# Patient Record
Sex: Male | Born: 1955 | Race: Black or African American | Hispanic: No | Marital: Single | Smoking: Former smoker
Health system: Southern US, Community
[De-identification: ages and names within clinical notes are randomized; demographics above are authoritative.]

## PROBLEM LIST (undated history)

## (undated) DIAGNOSIS — M5136 Other intervertebral disc degeneration, lumbar region: Secondary | ICD-10-CM

## (undated) DIAGNOSIS — R51 Headache: Secondary | ICD-10-CM

## (undated) DIAGNOSIS — J309 Allergic rhinitis, unspecified: Secondary | ICD-10-CM

## (undated) DIAGNOSIS — M5030 Other cervical disc degeneration, unspecified cervical region: Secondary | ICD-10-CM

## (undated) DIAGNOSIS — T7840XA Allergy, unspecified, initial encounter: Secondary | ICD-10-CM

## (undated) DIAGNOSIS — R519 Headache, unspecified: Secondary | ICD-10-CM

## (undated) DIAGNOSIS — F410 Panic disorder [episodic paroxysmal anxiety] without agoraphobia: Secondary | ICD-10-CM

## (undated) DIAGNOSIS — K219 Gastro-esophageal reflux disease without esophagitis: Secondary | ICD-10-CM

## (undated) DIAGNOSIS — F329 Major depressive disorder, single episode, unspecified: Secondary | ICD-10-CM

## (undated) DIAGNOSIS — I10 Essential (primary) hypertension: Secondary | ICD-10-CM

## (undated) DIAGNOSIS — F32A Depression, unspecified: Secondary | ICD-10-CM

## (undated) DIAGNOSIS — N200 Calculus of kidney: Secondary | ICD-10-CM

## (undated) DIAGNOSIS — Z87442 Personal history of urinary calculi: Secondary | ICD-10-CM

## (undated) HISTORY — DX: Calculus of kidney: N20.0

## (undated) HISTORY — PX: NO PAST SURGERIES: SHX2092

## (undated) HISTORY — PX: SKIN GRAFT: SHX250

## (undated) HISTORY — DX: Allergy, unspecified, initial encounter: T78.40XA

## (undated) HISTORY — DX: Other cervical disc degeneration, unspecified cervical region: M50.30

## (undated) HISTORY — DX: Essential (primary) hypertension: I10

## (undated) HISTORY — DX: Headache, unspecified: R51.9

## (undated) HISTORY — DX: Other intervertebral disc degeneration, lumbar region: M51.36

## (undated) HISTORY — DX: Gastro-esophageal reflux disease without esophagitis: K21.9

## (undated) HISTORY — DX: Allergic rhinitis, unspecified: J30.9

## (undated) HISTORY — DX: Depression, unspecified: F32.A

## (undated) HISTORY — DX: Major depressive disorder, single episode, unspecified: F32.9

## (undated) HISTORY — PX: MOUTH SURGERY: SHX715

## (undated) HISTORY — DX: Headache: R51

---

## 2001-09-13 ENCOUNTER — Encounter: Payer: Self-pay | Admitting: Emergency Medicine

## 2001-09-13 ENCOUNTER — Emergency Department (HOSPITAL_COMMUNITY): Admission: EM | Admit: 2001-09-13 | Discharge: 2001-09-13 | Payer: Self-pay | Admitting: Emergency Medicine

## 2001-09-15 ENCOUNTER — Emergency Department (HOSPITAL_COMMUNITY): Admission: EM | Admit: 2001-09-15 | Discharge: 2001-09-15 | Payer: Self-pay | Admitting: Emergency Medicine

## 2003-01-20 ENCOUNTER — Emergency Department (HOSPITAL_COMMUNITY): Admission: EM | Admit: 2003-01-20 | Discharge: 2003-01-20 | Payer: Self-pay | Admitting: Emergency Medicine

## 2003-01-20 ENCOUNTER — Encounter: Payer: Self-pay | Admitting: Emergency Medicine

## 2003-04-12 ENCOUNTER — Emergency Department (HOSPITAL_COMMUNITY): Admission: EM | Admit: 2003-04-12 | Discharge: 2003-04-13 | Payer: Self-pay | Admitting: Emergency Medicine

## 2009-08-15 ENCOUNTER — Emergency Department (HOSPITAL_COMMUNITY): Admission: EM | Admit: 2009-08-15 | Discharge: 2009-08-15 | Payer: Self-pay | Admitting: Emergency Medicine

## 2010-04-21 ENCOUNTER — Emergency Department (HOSPITAL_COMMUNITY)
Admission: EM | Admit: 2010-04-21 | Discharge: 2010-04-21 | Payer: Self-pay | Source: Home / Self Care | Admitting: Emergency Medicine

## 2010-04-22 ENCOUNTER — Emergency Department (HOSPITAL_COMMUNITY)
Admission: EM | Admit: 2010-04-22 | Discharge: 2010-04-22 | Payer: Self-pay | Source: Home / Self Care | Admitting: Emergency Medicine

## 2010-06-16 ENCOUNTER — Encounter (HOSPITAL_COMMUNITY): Payer: Self-pay | Admitting: Radiology

## 2010-06-16 ENCOUNTER — Emergency Department (HOSPITAL_COMMUNITY)
Admission: EM | Admit: 2010-06-16 | Discharge: 2010-06-16 | Disposition: A | Discharge status: Home / Self Care | Payer: Self-pay | Attending: Emergency Medicine | Admitting: Emergency Medicine

## 2010-06-16 ENCOUNTER — Emergency Department (HOSPITAL_COMMUNITY): Payer: Self-pay

## 2010-06-16 DIAGNOSIS — R10819 Abdominal tenderness, unspecified site: Secondary | ICD-10-CM | POA: Insufficient documentation

## 2010-06-16 DIAGNOSIS — M62838 Other muscle spasm: Secondary | ICD-10-CM | POA: Insufficient documentation

## 2010-06-16 DIAGNOSIS — M5137 Other intervertebral disc degeneration, lumbosacral region: Secondary | ICD-10-CM | POA: Insufficient documentation

## 2010-06-16 DIAGNOSIS — R109 Unspecified abdominal pain: Secondary | ICD-10-CM | POA: Insufficient documentation

## 2010-06-16 DIAGNOSIS — M51379 Other intervertebral disc degeneration, lumbosacral region without mention of lumbar back pain or lower extremity pain: Secondary | ICD-10-CM | POA: Insufficient documentation

## 2010-06-16 DIAGNOSIS — E669 Obesity, unspecified: Secondary | ICD-10-CM | POA: Insufficient documentation

## 2010-06-16 DIAGNOSIS — R609 Edema, unspecified: Secondary | ICD-10-CM | POA: Insufficient documentation

## 2010-06-16 DIAGNOSIS — N200 Calculus of kidney: Secondary | ICD-10-CM | POA: Insufficient documentation

## 2010-06-16 LAB — COMPREHENSIVE METABOLIC PANEL
ALT: 15 U/L (ref 0–53)
AST: 26 U/L (ref 0–37)
Albumin: 3.7 g/dL (ref 3.5–5.2)
Alkaline Phosphatase: 56 U/L (ref 39–117)
BUN: 11 mg/dL (ref 6–23)
CO2: 25 mEq/L (ref 19–32)
Calcium: 9.1 mg/dL (ref 8.4–10.5)
Chloride: 103 mEq/L (ref 96–112)
Creatinine, Ser: 1.08 mg/dL (ref 0.4–1.5)
GFR calc Af Amer: >60 mL/min (ref >60)
GFR calc non Af Amer: >60 mL/min (ref >60)
Glucose, Bld: 95 mg/dL (ref 70–99)
Potassium: 3.3 mEq/L — ABNORMAL LOW (ref 3.5–5.1)
Sodium: 137 mEq/L (ref 135–145)
Total Bilirubin: 0.7 mg/dL (ref 0.3–1.2)
Total Protein: 6.6 g/dL (ref 6.0–8.3)

## 2010-06-16 LAB — URINALYSIS, ROUTINE W REFLEX MICROSCOPIC
Bilirubin Urine: NEGATIVE
Ketones, ur: NEGATIVE mg/dL
Leukocytes, UA: NEGATIVE
Nitrite: NEGATIVE
Protein, ur: NEGATIVE mg/dL
Specific Gravity, Urine: 1.013 (ref 1.005–1.030)
Urine Glucose, Fasting: NEGATIVE mg/dL
Urobilinogen, UA: 0.2 mg/dL (ref 0.0–1.0)
pH: 6.5 (ref 5.0–8.0)

## 2010-06-16 LAB — URINE MICROSCOPIC-ADD ON

## 2010-09-15 ENCOUNTER — Emergency Department (HOSPITAL_COMMUNITY)
Admission: EM | Admit: 2010-09-15 | Discharge: 2010-09-15 | Disposition: A | Discharge status: Home / Self Care | Payer: Self-pay | Attending: Emergency Medicine | Admitting: Emergency Medicine

## 2010-09-15 DIAGNOSIS — M545 Low back pain, unspecified: Secondary | ICD-10-CM | POA: Insufficient documentation

## 2010-09-15 DIAGNOSIS — R109 Unspecified abdominal pain: Secondary | ICD-10-CM | POA: Insufficient documentation

## 2010-09-15 LAB — POCT I-STAT, CHEM 8
BUN: 12 mg/dL (ref 6–23)
Calcium, Ion: 1.16 mmol/L (ref 1.12–1.32)
Chloride: 103 mEq/L (ref 96–112)
Creatinine, Ser: 1.2 mg/dL (ref 0.4–1.5)
Glucose, Bld: 85 mg/dL (ref 70–99)
HCT: 47 % (ref 39.0–52.0)
Hemoglobin: 16 g/dL (ref 13.0–17.0)
Potassium: 3.8 mEq/L (ref 3.5–5.1)
Sodium: 139 mEq/L (ref 135–145)
TCO2: 25 mmol/L (ref 0–100)

## 2010-09-15 LAB — URINALYSIS, ROUTINE W REFLEX MICROSCOPIC
Bilirubin Urine: NEGATIVE
Glucose, UA: NEGATIVE mg/dL
Ketones, ur: NEGATIVE mg/dL
Leukocytes, UA: NEGATIVE
Nitrite: NEGATIVE
Protein, ur: NEGATIVE mg/dL
Specific Gravity, Urine: 1.016 (ref 1.005–1.030)
Urobilinogen, UA: 0.2 mg/dL (ref 0.0–1.0)
pH: 6.5 (ref 5.0–8.0)

## 2010-09-15 LAB — URINE MICROSCOPIC-ADD ON

## 2010-10-26 ENCOUNTER — Other Ambulatory Visit (INDEPENDENT_AMBULATORY_CARE_PROVIDER_SITE_OTHER): Payer: Self-pay

## 2010-10-26 ENCOUNTER — Encounter: Payer: Self-pay | Admitting: Internal Medicine

## 2010-10-26 ENCOUNTER — Ambulatory Visit (INDEPENDENT_AMBULATORY_CARE_PROVIDER_SITE_OTHER): Payer: Self-pay | Admitting: Internal Medicine

## 2010-10-26 VITALS — BP 174/110 | HR 81 | Temp 97.4°F | Ht 69.0 in | Wt 173.0 lb

## 2010-10-26 DIAGNOSIS — R079 Chest pain, unspecified: Secondary | ICD-10-CM | POA: Insufficient documentation

## 2010-10-26 DIAGNOSIS — J309 Allergic rhinitis, unspecified: Secondary | ICD-10-CM

## 2010-10-26 DIAGNOSIS — N200 Calculus of kidney: Secondary | ICD-10-CM | POA: Insufficient documentation

## 2010-10-26 DIAGNOSIS — M25512 Pain in left shoulder: Secondary | ICD-10-CM

## 2010-10-26 DIAGNOSIS — I10 Essential (primary) hypertension: Secondary | ICD-10-CM

## 2010-10-26 DIAGNOSIS — M5030 Other cervical disc degeneration, unspecified cervical region: Secondary | ICD-10-CM | POA: Insufficient documentation

## 2010-10-26 DIAGNOSIS — Z0000 Encounter for general adult medical examination without abnormal findings: Secondary | ICD-10-CM

## 2010-10-26 DIAGNOSIS — M25519 Pain in unspecified shoulder: Secondary | ICD-10-CM

## 2010-10-26 DIAGNOSIS — M5136 Other intervertebral disc degeneration, lumbar region: Secondary | ICD-10-CM

## 2010-10-26 DIAGNOSIS — M51369 Other intervertebral disc degeneration, lumbar region without mention of lumbar back pain or lower extremity pain: Secondary | ICD-10-CM

## 2010-10-26 DIAGNOSIS — K219 Gastro-esophageal reflux disease without esophagitis: Secondary | ICD-10-CM | POA: Insufficient documentation

## 2010-10-26 HISTORY — DX: Other intervertebral disc degeneration, lumbar region without mention of lumbar back pain or lower extremity pain: M51.369

## 2010-10-26 HISTORY — DX: Allergic rhinitis, unspecified: J30.9

## 2010-10-26 HISTORY — DX: Other cervical disc degeneration, unspecified cervical region: M50.30

## 2010-10-26 HISTORY — DX: Other intervertebral disc degeneration, lumbar region: M51.36

## 2010-10-26 HISTORY — DX: Essential (primary) hypertension: I10

## 2010-10-26 LAB — CREATININE, SERUM: Creatinine, Ser: 1.1 mg/dL (ref 0.4–1.5)

## 2010-10-26 LAB — BUN: BUN: 19 mg/dL (ref 6–23)

## 2010-10-26 MED ORDER — AMLODIPINE BESYLATE 10 MG PO TABS: 10.0000 mg | ORAL_TABLET | Freq: Every day | ORAL | Status: DC

## 2010-10-26 NOTE — Patient Instructions (Addendum)
Your EKG and exam was good today Take all new medications as prescribed - the amlodipine 10 mg per day (I believe less expensive cash pay at [employer]) Please check your BP at work (in 3 wks) at least 3-4 times over 3-4 days and call with results to [phone] Please go to LAB in the Basement for the blood and/or urine tests to be done today Please call the phone number [phone] (the PhoneTree System) for results of testing in 2-3 days;  When calling, simply dial the number, and when prompted enter the MRN number above (the Medical Record Number) and the # key, then the message should start. Please return in 3 mo with Lab testing done 3-5 days before (you may wish to cancel if you do not have the insurance by that time) OK to take tylenol as needed for pain

## 2010-10-26 NOTE — Assessment & Plan Note (Signed)
Exam benign,  Ok for tylenol prn ,  to f/u any worsening symptoms or concerns

## 2010-10-26 NOTE — Assessment & Plan Note (Signed)
New onset, severe but o/w asymtp; no insurance today; for education and counseling regarding HTN and end organ damage risk;  For amlodipine 10 mg daily ($5 at [employer]), check BP in 3 wks at work and call with results, consider add hctz at that time;  For BUN/cr today, avoid nsaids for now and ace/arb due to unknown renal fxn

## 2010-10-26 NOTE — Progress Notes (Signed)
Quick Note:  Voice message left on PhoneTree system - lab is negative, normal or otherwise stable, pt to continue same tx ______ 

## 2010-10-26 NOTE — Progress Notes (Signed)
  Subjective:    Patient ID: Mason Morales, male    DOB: [birth year], 55 y.o.   MRN: [id number]  HPI  Here to establish;  Has had BP checked at work wit persistent numbers similar to today several times in the past month;  Is very active, does not tend to gain wt, so had not thought to check.  Also mentions intermittent mid and left CP, dull, mild, "like a tightness" , no radiation, nonexertional and is pleuritic and tender to palpate at the left costal margin, not assoc with sob, n/v, palp's or dizziness.   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    Pt continues to have recurring left LBP without change in severity, bowel or bladder change, fever, wt loss,  worsening LE pain/numbness/weakness, gait change or falls, mild, makes him worry about recurrent kidney stones.  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  HENT: Negative for drooling and tinnitus.   Eyes: Negative for photophobia and visual disturbance.  Respiratory: Negative for choking and stridor.   Gastrointestinal: Negative for vomiting and blood in stool.  Genitourinary: Negative for hematuria and decreased urine volume. .       Objective:   Physical Exam BP 174/110  Pulse 81  Temp(Src) 97.4 F (36.3 C) (Oral)  Ht 5\' 9"  (1.753 m)  Wt 173 lb (78.472  kg)  BMI 25.55 kg/m2  SpO2 97% Physical Exam  VS noted, not ill appearing,  Constitutional: Pt appears well-developed and well-nourished., athletic appearing HENT: Head: Normocephalic.  Right Ear: External ear normal.  Left Ear: External ear normal.  Eyes: Conjunctivae and EOM are normal. Pupils are equal, round, and reactive to light.  Neck: Normal range of motion. Neck supple.  Cardiovascular: Normal rate and regular rhythm.   Pulmonary/Chest: Effort normal and breath sounds normal.  Abd:  Soft, NT, non-distended, + BS Neurological: Pt is alert. No cranial nerve deficit.  Skin: Skin is warm. No erythema. No LE edema Psychiatric: Pt behavior is normal. Thought content normal.  Bilat bony AC joint hypertrophy noted Left shoulder NT and FROM, no swelling or rash       Assessment & Plan:

## 2010-10-26 NOTE — Assessment & Plan Note (Signed)
Atypical, suspect MSK, for ECG today, ok to o/w follow

## 2011-01-24 ENCOUNTER — Ambulatory Visit: Payer: Self-pay | Admitting: Internal Medicine

## 2011-04-05 ENCOUNTER — Encounter (HOSPITAL_COMMUNITY): Payer: Self-pay | Admitting: Emergency Medicine

## 2011-04-05 ENCOUNTER — Emergency Department (HOSPITAL_COMMUNITY)
Admission: EM | Admit: 2011-04-05 | Discharge: 2011-04-05 | Disposition: A | Discharge status: Home / Self Care | Payer: Self-pay | Attending: Emergency Medicine | Admitting: Emergency Medicine

## 2011-04-05 DIAGNOSIS — R109 Unspecified abdominal pain: Secondary | ICD-10-CM | POA: Insufficient documentation

## 2011-04-05 DIAGNOSIS — R110 Nausea: Secondary | ICD-10-CM | POA: Insufficient documentation

## 2011-04-05 DIAGNOSIS — Z79899 Other long term (current) drug therapy: Secondary | ICD-10-CM | POA: Insufficient documentation

## 2011-04-05 DIAGNOSIS — K219 Gastro-esophageal reflux disease without esophagitis: Secondary | ICD-10-CM | POA: Insufficient documentation

## 2011-04-05 DIAGNOSIS — F329 Major depressive disorder, single episode, unspecified: Secondary | ICD-10-CM | POA: Insufficient documentation

## 2011-04-05 DIAGNOSIS — F3289 Other specified depressive episodes: Secondary | ICD-10-CM | POA: Insufficient documentation

## 2011-04-05 DIAGNOSIS — R300 Dysuria: Secondary | ICD-10-CM | POA: Insufficient documentation

## 2011-04-05 DIAGNOSIS — M549 Dorsalgia, unspecified: Secondary | ICD-10-CM | POA: Insufficient documentation

## 2011-04-05 DIAGNOSIS — N200 Calculus of kidney: Secondary | ICD-10-CM | POA: Insufficient documentation

## 2011-04-05 LAB — BASIC METABOLIC PANEL
CO2: 22 mEq/L (ref 19–32)
Calcium: 8.9 mg/dL (ref 8.4–10.5)
Creatinine, Ser: 0.91 mg/dL (ref 0.50–1.35)
GFR calc non Af Amer: >90 mL/min (ref >90)
Glucose, Bld: 94 mg/dL (ref 70–99)
Sodium: 135 mEq/L (ref 135–145)

## 2011-04-05 LAB — CBC
MCH: 31.4 pg (ref 26.0–34.0)
MCV: 90.5 fL (ref 78.0–100.0)
Platelets: 167 10*3/uL (ref 150–400)
RBC: 4.65 MIL/uL (ref 4.22–5.81)
RDW: 12.2 % (ref 11.5–15.5)

## 2011-04-05 LAB — URINALYSIS, ROUTINE W REFLEX MICROSCOPIC
Ketones, ur: NEGATIVE mg/dL
Leukocytes, UA: NEGATIVE
Nitrite: NEGATIVE
Protein, ur: NEGATIVE mg/dL
Urobilinogen, UA: 0.2 mg/dL (ref 0.0–1.0)

## 2011-04-05 LAB — DIFFERENTIAL
Basophils Absolute: 0 10*3/uL (ref 0.0–0.1)
Basophils Relative: 0 % (ref 0–1)
Eosinophils Absolute: 0.1 10*3/uL (ref 0.0–0.7)
Eosinophils Relative: 2 % (ref 0–5)
Lymphs Abs: 2.2 10*3/uL (ref 0.7–4.0)
Neutrophils Relative %: 51 % (ref 43–77)

## 2011-04-05 LAB — URINE MICROSCOPIC-ADD ON

## 2011-04-05 MED ORDER — ONDANSETRON HCL 4 MG/2ML IJ SOLN
4.0000 mg | Freq: Once | INTRAMUSCULAR | Status: AC
  Administered 2011-04-05: 4 mg via INTRAVENOUS
  Filled 2011-04-05: qty 2

## 2011-04-05 MED ORDER — KETOROLAC TROMETHAMINE 30 MG/ML IJ SOLN
30.0000 mg | Freq: Once | INTRAMUSCULAR | Status: AC
  Administered 2011-04-05: 30 mg via INTRAVENOUS
  Filled 2011-04-05: qty 1

## 2011-04-05 MED ORDER — TAMSULOSIN HCL 0.4 MG PO CAPS: 0.4000 mg | ORAL_CAPSULE | ORAL | Status: DC

## 2011-04-05 MED ORDER — HYDROMORPHONE HCL PF 1 MG/ML IJ SOLN
1.0000 mg | Freq: Once | INTRAMUSCULAR | Status: AC
  Administered 2011-04-05: 1 mg via INTRAVENOUS
  Filled 2011-04-05: qty 1

## 2011-04-05 MED ORDER — SODIUM CHLORIDE 0.9 % IV BOLUS (SEPSIS)
1000.0000 mL | Freq: Once | INTRAVENOUS | Status: AC
  Administered 2011-04-05: 1000 mL via INTRAVENOUS

## 2011-04-05 MED ORDER — OXYCODONE-ACETAMINOPHEN 5-325 MG PO TABS: 1.0000 | ORAL_TABLET | Freq: Four times a day (QID) | ORAL | Status: AC | PRN

## 2011-04-05 NOTE — ED Provider Notes (Signed)
History     CSN: [id number] Arrival date & time: 04/05/2011 10:26 AM   First MD Initiated Contact with Patient 04/05/11 1039      Chief Complaint  Patient presents with  . Back Pain  . Flank Pain    (Consider location/radiation/quality/duration/timing/severity/associated sxs/prior treatment) HPI Comments: Patient has a history of kidney stones and states that on Tuesday started developing flank pain worse on the left but now just has generalized back pain and groin pain. Denies any penile discharge. Pain is now a 10 out of 10  Patient is a 55 y.o. male presenting with flank pain. The history is provided by the patient.  Flank Pain This is a new problem. Episode onset: 3 days ago. The problem occurs constantly. The problem has been gradually worsening. Associated symptoms include abdominal pain. Pertinent negatives include no chest pain and no shortness of breath. Associated symptoms comments: No fever or vomiting or diarrhea. Persistent nausea with dysuria.. Exacerbated by: Urination. The symptoms are relieved by nothing. He has tried acetaminophen and rest for the symptoms. The treatment provided no relief.    Past Medical History  Diagnosis Date  . Depression   . Generalized headaches   . GERD (gastroesophageal reflux disease)   . Allergy   . Hypertension   . Kidney stones   . Allergic rhinitis, cause unspecified 10/26/2010  . HTN (hypertension) 10/26/2010  . DDD (degenerative disc disease), cervical 10/26/2010  . DDD (degenerative disc disease), lumbar 10/26/2010    Past Surgical History  Procedure Date  . Mouth surgery at 55 yo after horse accident    Family History  Problem Relation Age of Onset  . Hyperlipidemia Other   . Heart disease Other   . Stroke Other   . Hypertension Other   . Diabetes Other   . Cancer Other     prostate cancer  . Diabetes Other   . Alcohol abuse Other   . Cancer Other     breast cancer  . Stroke Other     History  Substance Use Topics   . Smoking status: Current Everyday Smoker  . Smokeless tobacco: Not on file  . Alcohol Use: Yes      Review of Systems  Respiratory: Negative for shortness of breath.   Cardiovascular: Negative for chest pain.  Gastrointestinal: Positive for nausea and abdominal pain. Negative for vomiting and diarrhea.  Genitourinary: Positive for dysuria and flank pain.  All other systems reviewed and are negative.    Allergies  Sulfa antibiotics  Home Medications   Current Outpatient Rx  Name Route Sig Dispense Refill  . AMLODIPINE BESYLATE 10 MG PO TABS Oral Take 1 tablet (10 mg total) by mouth daily. 30 tablet 11  . B COMPLEX VITAMINS PO CAPS Oral Take 1 capsule by mouth daily.      Carma Leaven M PLUS PO TABS Oral Take 1 tablet by mouth daily.      Marland Kitchen FISH OIL 1000 MG PO CAPS Oral Take by mouth 3 (three) times daily.      Marland Kitchen PRILOSEC PO Oral Take 1 capsule by mouth daily as needed. For heartburn,GERD       BP 136/84  Pulse 76  Temp(Src) 97.6 F (36.4 C) (Oral)  Resp 20  SpO2 100%  Physical Exam  Nursing note and vitals reviewed. Constitutional: He is oriented to person, place, and time. He appears well-developed and well-nourished. No distress.  HENT:  Head: Normocephalic and atraumatic.  Mouth/Throat: Oropharynx is clear and moist.  Eyes: Conjunctivae and EOM are normal. Pupils are equal, round, and reactive to light.  Neck: Normal range of motion. Neck supple.  Cardiovascular: Normal rate, regular rhythm and intact distal pulses.   No murmur heard. Pulmonary/Chest: Effort normal and breath sounds normal. No respiratory distress. He has no wheezes. He has no rales.  Abdominal: Soft. He exhibits no distension. There is tenderness in the suprapubic area. There is CVA tenderness. There is no rebound and no guarding.       CVA tenderness most pronounced on the left  Musculoskeletal: Normal range of motion. He exhibits no edema and no tenderness.  Neurological: He is alert and oriented  to person, place, and time.  Skin: Skin is warm and dry. No rash noted. No erythema.  Psychiatric: He has a normal mood and affect. His behavior is normal.    ED Course  Procedures (including critical care time)  Results for orders placed during the hospital encounter of 04/05/11  BASIC METABOLIC PANEL      Component Value Range   Sodium 135  135 - 145 (mEq/L)   Potassium 3.5  3.5 - 5.1 (mEq/L)   Chloride 102  96 - 112 (mEq/L)   CO2 22  19 - 32 (mEq/L)   Glucose, Bld 94  70 - 99 (mg/dL)   BUN 13  6 - 23 (mg/dL)   Creatinine, Ser 1.61  0.50 - 1.35 (mg/dL)   Calcium 8.9  8.4 - 09.6 (mg/dL)   GFR calc non Af Amer >90  >90 (mL/min)   GFR calc Af Amer >90  >90 (mL/min)  CBC      Component Value Range   WBC 5.5  4.0 - 10.5 (K/uL)   RBC 4.65  4.22 - 5.81 (MIL/uL)   Hemoglobin 14.6  13.0 - 17.0 (g/dL)   HCT 04.5  40.9 - 81.1 (%)   MCV 90.5  78.0 - 100.0 (fL)   MCH 31.4  26.0 - 34.0 (pg)   MCHC 34.7  30.0 - 36.0 (g/dL)   RDW 91.4  78.2 - 95.6 (%)   Platelets 167  150 - 400 (K/uL)  DIFFERENTIAL      Component Value Range   Neutrophils Relative 51  43 - 77 (%)   Neutro Abs 2.8  1.7 - 7.7 (K/uL)   Lymphocytes Relative 41  12 - 46 (%)   Lymphs Abs 2.2  0.7 - 4.0 (K/uL)   Monocytes Relative 6  3 - 12 (%)   Monocytes Absolute 0.3  0.1 - 1.0 (K/uL)   Eosinophils Relative 2  0 - 5 (%)   Eosinophils Absolute 0.1  0.0 - 0.7 (K/uL)   Basophils Relative 0  0 - 1 (%)   Basophils Absolute 0.0  0.0 - 0.1 (K/uL)  URINALYSIS, ROUTINE W REFLEX MICROSCOPIC      Component Value Range   Color, Urine YELLOW  YELLOW    APPearance TURBID (*) CLEAR    Specific Gravity, Urine 1.013  1.005 - 1.030    pH 6.5  5.0 - 8.0    Glucose, UA NEGATIVE  NEGATIVE (mg/dL)   Hgb urine dipstick TRACE (*) NEGATIVE    Bilirubin Urine NEGATIVE  NEGATIVE    Ketones, ur NEGATIVE  NEGATIVE (mg/dL)   Protein, ur NEGATIVE  NEGATIVE (mg/dL)   Urobilinogen, UA 0.2  0.0 - 1.0 (mg/dL)   Nitrite NEGATIVE  NEGATIVE     Leukocytes, UA NEGATIVE  NEGATIVE   URINE MICROSCOPIC-ADD ON      Component  Value Range   WBC, UA 0-2  <3 (WBC/hpf)   RBC / HPF 0-2  <3 (RBC/hpf)   Urine-Other MUCOUS PRESENT     No results found.    No diagnosis found.    MDM   Pt with symptoms consistent with kidney stone vs pyelonephritis.  Denies GI symptoms.  Low concern for diverticulitis and no risk factors or history suggestive of AAA.  No hx suggestive of GU source (discharge) and otherwise pt is healthy.  History of kidney stones in the past and a CT done in February showed a small right 2 mm stone in the upper pole of the kidney. Patient states symptoms are worse on the left today with nausea but no vomiting and no fever. Will hydrate, treat pain and ensure no infection with UA, CBC, BMP to evaluate. Will hold off finding repeat CT in less pain does not improve.  12:36 PM Is within normal limits other than trace hemoglobin in the urine. Patient is most likely passing a kidney stone however by mouth that since his last CT only showed a 2 mm stone there is no reason to rescan him today. Will treat pain symptomatically and have him followup with urology.    Gwyneth Sprout, MD 04/05/11 [phone]

## 2011-04-05 NOTE — ED Notes (Signed)
Pt up ambulatory at this time to the bathroom to attempt to provide an urine specimen

## 2011-04-05 NOTE — ED Notes (Signed)
Pt here with c/o bilateral low back, flank and groin pain that started on Tuesday and has become worse.  Pt rates pain 10/10 and reports nausea with the pain.  Pt alert and oriented x4 and in NAD.  Pt reports hx of kidney stones.

## 2011-04-05 NOTE — ED Notes (Signed)
Pt here with c/o low back, flank and groin pain bilaterally.  Pt reports nausea as well and is rating pain 10/10.  Pt denies hematuria.

## 2011-11-05 ENCOUNTER — Other Ambulatory Visit: Payer: Self-pay

## 2011-11-05 DIAGNOSIS — I10 Essential (primary) hypertension: Secondary | ICD-10-CM

## 2011-11-05 MED ORDER — AMLODIPINE BESYLATE 10 MG PO TABS: 10.0000 mg | ORAL_TABLET | Freq: Every day | ORAL | Status: DC

## 2011-12-04 ENCOUNTER — Other Ambulatory Visit: Payer: Self-pay

## 2011-12-04 DIAGNOSIS — I10 Essential (primary) hypertension: Secondary | ICD-10-CM

## 2011-12-04 MED ORDER — AMLODIPINE BESYLATE 10 MG PO TABS: 10.0000 mg | ORAL_TABLET | Freq: Every day | ORAL | Status: DC

## 2011-12-21 ENCOUNTER — Other Ambulatory Visit: Payer: Self-pay

## 2011-12-21 DIAGNOSIS — I10 Essential (primary) hypertension: Secondary | ICD-10-CM

## 2011-12-21 MED ORDER — AMLODIPINE BESYLATE 10 MG PO TABS: 10.0000 mg | ORAL_TABLET | Freq: Every day | ORAL | Status: DC

## 2012-01-21 ENCOUNTER — Other Ambulatory Visit: Payer: Self-pay | Admitting: Internal Medicine

## 2012-02-22 ENCOUNTER — Other Ambulatory Visit: Payer: Self-pay

## 2012-02-22 DIAGNOSIS — I10 Essential (primary) hypertension: Secondary | ICD-10-CM

## 2012-02-22 MED ORDER — AMLODIPINE BESYLATE 10 MG PO TABS: 10.0000 mg | ORAL_TABLET | Freq: Every day | ORAL | Status: DC

## 2012-02-22 NOTE — Telephone Encounter (Signed)
Called the patient left detailed message to call the office asap to schedule appt. With Dr. Jonny Ruiz to get further refills.

## 2012-03-18 ENCOUNTER — Ambulatory Visit (INDEPENDENT_AMBULATORY_CARE_PROVIDER_SITE_OTHER): Payer: Self-pay | Admitting: Internal Medicine

## 2012-03-18 ENCOUNTER — Other Ambulatory Visit (INDEPENDENT_AMBULATORY_CARE_PROVIDER_SITE_OTHER): Payer: Self-pay

## 2012-03-18 ENCOUNTER — Encounter: Payer: Self-pay | Admitting: Internal Medicine

## 2012-03-18 VITALS — BP 134/82 | HR 72 | Temp 98.0°F | Ht 69.0 in | Wt 167.4 lb

## 2012-03-18 DIAGNOSIS — K219 Gastro-esophageal reflux disease without esophagitis: Secondary | ICD-10-CM

## 2012-03-18 DIAGNOSIS — I10 Essential (primary) hypertension: Secondary | ICD-10-CM

## 2012-03-18 DIAGNOSIS — Z0000 Encounter for general adult medical examination without abnormal findings: Secondary | ICD-10-CM

## 2012-03-18 DIAGNOSIS — M549 Dorsalgia, unspecified: Secondary | ICD-10-CM | POA: Insufficient documentation

## 2012-03-18 LAB — URINALYSIS, ROUTINE W REFLEX MICROSCOPIC
Bilirubin Urine: NEGATIVE
Ketones, ur: NEGATIVE
Nitrite: NEGATIVE
Total Protein, Urine: NEGATIVE
Urine Glucose: NEGATIVE
pH: 6 (ref 5.0–8.0)

## 2012-03-18 MED ORDER — AMLODIPINE BESYLATE 10 MG PO TABS: 10.0000 mg | ORAL_TABLET | Freq: Every day | ORAL | Status: DC

## 2012-03-18 MED ORDER — ASPIRIN 81 MG PO TBEC: 81.0000 mg | DELAYED_RELEASE_TABLET | Freq: Every day | ORAL | Status: DC

## 2012-03-18 MED ORDER — OMEPRAZOLE 20 MG PO CPDR: 20.0000 mg | DELAYED_RELEASE_CAPSULE | Freq: Every day | ORAL | Status: DC

## 2012-03-18 MED ORDER — TRAMADOL HCL 50 MG PO TABS: 50.0000 mg | ORAL_TABLET | Freq: Four times a day (QID) | ORAL | Status: DC | PRN

## 2012-03-18 NOTE — Assessment & Plan Note (Signed)
stable overall by hx and exam, and pt to continue medical treatment as before, for med refill 

## 2012-03-18 NOTE — Progress Notes (Signed)
Subjective:    Patient ID: Mason Morales, male    DOB: January 22, 1956, 56 y.o.   MRN: [id number]  HPI  Here with c/o 3 wks recurring LBP with radiation to the pubic bone area (right and left about the pelvis level) just not getting better except somewhat better at the moment today, but no bowel or bladder change, fever, wt loss,  worsening LE pain/numbness/weakness, gait change or falls, except for a fall off a horse (has been riding/racing for 40 yrs) prior to onset of episode of pain - was sort of a controlled fall in that he knew he had to get off, and sort of fell/jumped in a way that he could roll and lessen risk of injury; Also with hx of renal stones but not clear if pain was more flank area, and noted no gross hemturia and  Denies urinary symptoms such as dysuria, frequency, urgency.  Also mentions was seen/tx with what sounds like IM rocephin for urethralpenile d/c that improved, but neg for STD including gc/chlamydia.   Pt denies chest pain, increased sob or doe, wheezing, orthopnea, PND, increased LE swelling, palpitations, dizziness or syncope.  Pt denies new neurological symptoms such as new headache, or facial or extremity weakness or numbness   Pt denies polydipsia, polyuria.  Denies worsening reflux, dysphagia, abd pain, n/v, bowel change or blood. Past Medical History  Diagnosis Date  . Depression   . Generalized headaches   . GERD (gastroesophageal reflux disease)   . Allergy   . Hypertension   . Kidney stones   . Allergic rhinitis, cause unspecified 10/26/2010  . HTN (hypertension) 10/26/2010  . DDD (degenerative disc disease), cervical 10/26/2010  . DDD (degenerative disc disease), lumbar 10/26/2010   Past Surgical History  Procedure Date  . Mouth surgery at 56 yo after horse accident    reports that he has been smoking.  He does not have any smokeless tobacco history on file. He reports that he drinks alcohol. He reports that he does not use illicit drugs. family history includes  Alcohol abuse in his other; Cancer in his others; Diabetes in his others; Heart disease in his other; Hyperlipidemia in his other; Hypertension in his other; and Stroke in his others. Allergies  Allergen Reactions  . Sulfa Antibiotics Nausea And Vomiting   Current Outpatient Prescriptions on File Prior to Visit  Medication Sig Dispense Refill  . b complex vitamins capsule Take 1 capsule by mouth daily.        . Multiple Vitamins-Minerals (MULTIVITAMINS THER. W/MINERALS) TABS Take 1 tablet by mouth daily.        . Omega-3 Fatty Acids (FISH OIL) 1000 MG CAPS Take by mouth 3 (three) times daily.        . Omeprazole (PRILOSEC PO) Take 1 capsule by mouth daily as needed. For heartburn,GERD       . [DISCONTINUED] amLODipine (NORVASC) 10 MG tablet TAKE 1 TABLET (10 MG TOTAL) BY MOUTH DAILY.  30 tablet  0  . [DISCONTINUED] amLODipine (NORVASC) 10 MG tablet Take 1 tablet (10 mg total) by mouth daily.  30 tablet  0  . omeprazole (PRILOSEC) 20 MG capsule Take 1 capsule (20 mg total) by mouth daily.  90 capsule  3   Review of Systems  Constitutional: Negative for diaphoresis and unexpected weight change.  HENT: Negative for tinnitus.   Eyes: Negative for photophobia and visual disturbance.  Respiratory: Negative for choking and stridor.   Gastrointestinal: Negative for vomiting and blood in stool.  Genitourinary: Negative for hematuria and decreased urine volume.  Musculoskeletal: Negative for gait problem.  Skin: Negative for color change and wound.  Neurological: Negative for tremors and numbness.  Psychiatric/Behavioral: Negative for decreased concentration. The patient is not hyperactive.      Objective:   Physical Exam BP 134/82  Pulse 72  Temp 98 F (36.7 C) (Oral)  Ht 5\' 9"  (1.753 m)  Wt 167 lb 6 oz (75.921 kg)  BMI 24.72 kg/m2  SpO2 97% Physical Exam  VS noted Constitutional: Pt appears well-developed and well-nourished.  HENT: Head: Normocephalic.  Right Ear: External ear normal.   Left Ear: External ear normal.  Eyes: Conjunctivae and EOM are normal. Pupils are equal, round, and reactive to light.  Neck: Normal range of motion. Neck supple.  Cardiovascular: Normal rate and regular rhythm.   Pulmonary/Chest: Effort normal and breath sounds normal.  Abd:  Soft, NT, non-distended, + BS, no flank tender Neurological: Pt is alert. Not confused , motor/sens/dtr intact, gait intact Skin: Skin is warm. No erythema.  Psychiatric: Pt behavior is normal. Thought content normal.  Spine nontender, has bilat lumbar paravertebral tender    Assessment & Plan:

## 2012-03-18 NOTE — Assessment & Plan Note (Signed)
Unclear etiology, I suspect primary dx MSK related given the fall prior to onset pain with radiation bilat towards the pubic ostium, but cant r/o renal stone or other GU issue such as bladder/renal contusion or other lumbar disc dz with bilat radicular pain vs other;  Ideally I would ask for ls spine films, hip/pelvic films but he declines due to cost/no insurance today;  Will accept check UA however to r/o hematuria

## 2012-03-18 NOTE — Patient Instructions (Addendum)
Take all new medications as prescribed  - the pain medication Continue all other medications as before (all refilled today) Please also start Aspirin 81 mg -1 per day - COATED only Please go to LAB in the Basement for the urine test to be done today - ONLY THE URINE TEST You will be contacted by phone if any changes need to be made immediately.  Otherwise, you will receive a letter about your results with an explanation, but please check with MyChart first. Thank you for enrolling in MyChart. Please follow the instructions below to securely access your online medical record. MyChart allows you to send messages to your doctor, view your test results, renew your prescriptions, schedule appointments, and more. To Log into MyChart, please go to [email or web address], and your Username is:  [email or web address] Please return in Feb 2014 with Lab testing done 3-5 days before (blood and urine)

## 2012-03-18 NOTE — Assessment & Plan Note (Signed)
stable overall by hx and exam, most recent data reviewed with pt, and pt to continue medical treatment as before BP Readings from Last 3 Encounters:  03/18/12 134/82  04/05/11 148/85  10/26/10 174/110

## 2012-05-31 ENCOUNTER — Encounter (HOSPITAL_COMMUNITY): Payer: Self-pay | Admitting: Adult Health

## 2012-05-31 ENCOUNTER — Emergency Department (HOSPITAL_COMMUNITY): Payer: Self-pay

## 2012-05-31 ENCOUNTER — Emergency Department (HOSPITAL_COMMUNITY)
Admission: EM | Admit: 2012-05-31 | Discharge: 2012-06-01 | Disposition: A | Discharge status: Home / Self Care | Payer: Self-pay | Attending: Emergency Medicine | Admitting: Emergency Medicine

## 2012-05-31 DIAGNOSIS — Z87442 Personal history of urinary calculi: Secondary | ICD-10-CM | POA: Insufficient documentation

## 2012-05-31 DIAGNOSIS — Z7982 Long term (current) use of aspirin: Secondary | ICD-10-CM | POA: Insufficient documentation

## 2012-05-31 DIAGNOSIS — K219 Gastro-esophageal reflux disease without esophagitis: Secondary | ICD-10-CM | POA: Insufficient documentation

## 2012-05-31 DIAGNOSIS — I10 Essential (primary) hypertension: Secondary | ICD-10-CM | POA: Insufficient documentation

## 2012-05-31 DIAGNOSIS — Z79899 Other long term (current) drug therapy: Secondary | ICD-10-CM | POA: Insufficient documentation

## 2012-05-31 DIAGNOSIS — F329 Major depressive disorder, single episode, unspecified: Secondary | ICD-10-CM | POA: Insufficient documentation

## 2012-05-31 DIAGNOSIS — R110 Nausea: Secondary | ICD-10-CM | POA: Insufficient documentation

## 2012-05-31 DIAGNOSIS — F3289 Other specified depressive episodes: Secondary | ICD-10-CM | POA: Insufficient documentation

## 2012-05-31 DIAGNOSIS — Z8709 Personal history of other diseases of the respiratory system: Secondary | ICD-10-CM | POA: Insufficient documentation

## 2012-05-31 DIAGNOSIS — Z8739 Personal history of other diseases of the musculoskeletal system and connective tissue: Secondary | ICD-10-CM | POA: Insufficient documentation

## 2012-05-31 DIAGNOSIS — F17200 Nicotine dependence, unspecified, uncomplicated: Secondary | ICD-10-CM | POA: Insufficient documentation

## 2012-05-31 DIAGNOSIS — M549 Dorsalgia, unspecified: Secondary | ICD-10-CM | POA: Insufficient documentation

## 2012-05-31 DIAGNOSIS — M62838 Other muscle spasm: Secondary | ICD-10-CM | POA: Insufficient documentation

## 2012-05-31 LAB — CBC WITH DIFFERENTIAL/PLATELET
Basophils Relative: 0 % (ref 0–1)
Eosinophils Absolute: 0.1 10*3/uL (ref 0.0–0.7)
MCH: 31.5 pg (ref 26.0–34.0)
MCHC: 35.8 g/dL (ref 30.0–36.0)
Neutro Abs: 4.4 10*3/uL (ref 1.7–7.7)
Neutrophils Relative %: 56 % (ref 43–77)
Platelets: 179 10*3/uL (ref 150–400)
RBC: 5.21 MIL/uL (ref 4.22–5.81)

## 2012-05-31 LAB — URINALYSIS, ROUTINE W REFLEX MICROSCOPIC
Bilirubin Urine: NEGATIVE
Ketones, ur: 15 mg/dL — AB
Leukocytes, UA: NEGATIVE
Nitrite: NEGATIVE
Specific Gravity, Urine: 1.015 (ref 1.005–1.030)
Urobilinogen, UA: 0.2 mg/dL (ref 0.0–1.0)
pH: 5.5 (ref 5.0–8.0)

## 2012-05-31 LAB — BASIC METABOLIC PANEL
Chloride: 97 mEq/L (ref 96–112)
GFR calc Af Amer: >90 mL/min (ref >90)
GFR calc non Af Amer: 81 mL/min — ABNORMAL LOW (ref >90)
Potassium: 3.7 mEq/L (ref 3.5–5.1)
Sodium: 134 mEq/L — ABNORMAL LOW (ref 135–145)

## 2012-05-31 MED ORDER — FENTANYL CITRATE 0.05 MG/ML IJ SOLN
50.0000 ug | Freq: Once | INTRAMUSCULAR | Status: AC
  Administered 2012-05-31: 50 ug via NASAL
  Filled 2012-05-31: qty 2

## 2012-05-31 MED ORDER — HYDROMORPHONE HCL PF 1 MG/ML IJ SOLN
1.0000 mg | Freq: Once | INTRAMUSCULAR | Status: AC
  Administered 2012-05-31: 1 mg via INTRAVENOUS
  Filled 2012-05-31: qty 1

## 2012-05-31 MED ORDER — ONDANSETRON HCL 4 MG/2ML IJ SOLN
4.0000 mg | Freq: Once | INTRAMUSCULAR | Status: AC
  Administered 2012-05-31: 4 mg via INTRAVENOUS
  Filled 2012-05-31: qty 2

## 2012-05-31 NOTE — ED Provider Notes (Signed)
History     CSN: [id number]  Arrival date & time 05/31/12  2116   First MD Initiated Contact with Patient 05/31/12 2302      Chief Complaint  Patient presents with  . Flank Pain  . Abdominal Pain  . Groin Pain    (Consider location/radiation/quality/duration/timing/severity/associated sxs/prior treatment) HPI Hx per PT. L flank pain x 3 days, migrating from flank to L groin area, unable to get comfortbale, sharp in quality, severe tonight. Nausea no vomiting, no F/C, unable to say if he has noticed hematuria, has h/o kidney stones and PT worried he has another. No trauma. No dysuria. No blood in stools. No trauma, no recalled injury to back Past Medical History  Diagnosis Date  . Depression   . Generalized headaches   . GERD (gastroesophageal reflux disease)   . Allergy   . Hypertension   . Kidney stones   . Allergic rhinitis, cause unspecified 10/26/2010  . HTN (hypertension) 10/26/2010  . DDD (degenerative disc disease), cervical 10/26/2010  . DDD (degenerative disc disease), lumbar 10/26/2010    Past Surgical History  Procedure Laterality Date  . Mouth surgery  at 57 yo after horse accident    Family History  Problem Relation Age of Onset  . Hyperlipidemia Other   . Heart disease Other   . Stroke Other   . Hypertension Other   . Diabetes Other   . Cancer Other     prostate cancer  . Diabetes Other   . Alcohol abuse Other   . Cancer Other     breast cancer  . Stroke Other     History  Substance Use Topics  . Smoking status: Current Every Day Smoker  . Smokeless tobacco: Not on file  . Alcohol Use: Yes      Review of Systems  Constitutional: Negative for fever and chills.  HENT: Negative for neck pain and neck stiffness.   Eyes: Negative for pain.  Respiratory: Negative for shortness of breath.   Cardiovascular: Negative for chest pain.  Gastrointestinal: Negative for vomiting and abdominal pain.  Genitourinary: Positive for flank pain.  Musculoskeletal:  Negative for back pain.  Skin: Negative for rash.  Neurological: Negative for headaches.  All other systems reviewed and are negative.    Allergies  Sulfa antibiotics  Home Medications   Current Outpatient Rx  Name  Route  Sig  Dispense  Refill  . amLODipine (NORVASC) 10 MG tablet   Oral   Take 1 tablet (10 mg total) by mouth daily.   90 tablet   3     NO further refills without appointment   . aspirin 81 MG EC tablet   Oral   Take 1 tablet (81 mg total) by mouth daily. Swallow whole.   30 tablet   12   . b complex vitamins capsule   Oral   Take 1 capsule by mouth daily.           . Multiple Vitamins-Minerals (MULTIVITAMINS THER. W/MINERALS) TABS   Oral   Take 1 tablet by mouth daily.           . Omega-3 Fatty Acids (FISH OIL) 1000 MG CAPS   Oral   Take by mouth 3 (three) times daily.           Marland Kitchen omeprazole (PRILOSEC) 20 MG capsule   Oral   Take 1 capsule (20 mg total) by mouth daily.   90 capsule   3   . traMADol (ULTRAM) 50  MG tablet   Oral   Take 1 tablet (50 mg total) by mouth every 6 (six) hours as needed for pain.   60 tablet   1     BP 127/78  Pulse 67  Temp(Src) 98.3 F (36.8 C) (Oral)  Resp 16  Ht 5\' 9"  (1.753 m)  Wt 175 lb (79.379 kg)  BMI 25.83 kg/m2  SpO2 98%  Physical Exam  Constitutional: He is oriented to person, place, and time. He appears well-developed and well-nourished.  HENT:  Head: Normocephalic and atraumatic.  Eyes: EOM are normal. Pupils are equal, round, and reactive to light.  Neck: Neck supple.  Cardiovascular: Regular rhythm and intact distal pulses.   Pulmonary/Chest: Effort normal. No respiratory distress.  Abdominal: Soft. Bowel sounds are normal. He exhibits no distension.  Tender L flank and ABD  Musculoskeletal: Normal range of motion. He exhibits no edema.  Neurological: He is alert and oriented to person, place, and time.  Skin: Skin is warm and dry.    ED Course  Procedures (including critical  care time)  Results for orders placed during the hospital encounter of 05/31/12  URINALYSIS, ROUTINE W REFLEX MICROSCOPIC      Result Value Range   Color, Urine YELLOW  YELLOW   APPearance CLEAR  CLEAR   Specific Gravity, Urine 1.015  1.005 - 1.030   pH 5.5  5.0 - 8.0   Glucose, UA NEGATIVE  NEGATIVE mg/dL   Hgb urine dipstick NEGATIVE  NEGATIVE   Bilirubin Urine NEGATIVE  NEGATIVE   Ketones, ur 15 (*) NEGATIVE mg/dL   Protein, ur NEGATIVE  NEGATIVE mg/dL   Urobilinogen, UA 0.2  0.0 - 1.0 mg/dL   Nitrite NEGATIVE  NEGATIVE   Leukocytes, UA NEGATIVE  NEGATIVE  CBC WITH DIFFERENTIAL      Result Value Range   WBC 8.0  4.0 - 10.5 K/uL   RBC 5.21  4.22 - 5.81 MIL/uL   Hemoglobin 16.4  13.0 - 17.0 g/dL   HCT 54.0  98.1 - 19.1 %   MCV 87.9  78.0 - 100.0 fL   MCH 31.5  26.0 - 34.0 pg   MCHC 35.8  30.0 - 36.0 g/dL   RDW 47.8  29.5 - 62.1 %   Platelets 179  150 - 400 K/uL   Neutrophils Relative 56  43 - 77 %   Neutro Abs 4.4  1.7 - 7.7 K/uL   Lymphocytes Relative 38  12 - 46 %   Lymphs Abs 3.0  0.7 - 4.0 K/uL   Monocytes Relative 5  3 - 12 %   Monocytes Absolute 0.4  0.1 - 1.0 K/uL   Eosinophils Relative 1  0 - 5 %   Eosinophils Absolute 0.1  0.0 - 0.7 K/uL   Basophils Relative 0  0 - 1 %   Basophils Absolute 0.0  0.0 - 0.1 K/uL  BASIC METABOLIC PANEL      Result Value Range   Sodium 134 (*) 135 - 145 mEq/L   Potassium 3.7  3.5 - 5.1 mEq/L   Chloride 97  96 - 112 mEq/L   CO2 24  19 - 32 mEq/L   Glucose, Bld 92  70 - 99 mg/dL   BUN 16  6 - 23 mg/dL   Creatinine, Ser 3.08  0.50 - 1.35 mg/dL   Calcium 9.8  8.4 - 65.7 mg/dL   GFR calc non Af Amer 81 (*) >90 mL/min   GFR calc Af Amer >90  >  90 mL/min   Ct Abdomen Pelvis Wo Contrast  06/01/2012  *RADIOLOGY REPORT*  Clinical Data: Left flank pain  CT ABDOMEN AND PELVIS WITHOUT CONTRAST  Technique:  Multidetector CT imaging of the abdomen and pelvis was performed following the standard protocol without intravenous contrast.   Comparison: 06/16/2010  Findings: Lung bases are clear.  Unenhanced liver, spleen, pancreas, and adrenal glands within normal limits.  Gallbladder is unremarkable.  No intrahepatic or extrahepatic ductal dilatation.  Punctate nonobstructing interpolar right renal calculus (series 2/image 35).  Kidneys are otherwise unremarkable.  No hydronephrosis.  No evidence of bowel obstruction.  Normal appendix.  Possible eccentric wall thickening in the rectum (series 2/image 71).  Atherosclerotic calcifications of the abdominal aorta and branch vessels.  No abdominopelvic ascites.  No suspicious abdominopelvic lymphadenopathy.  Prostate is within normal limits.  No ureteral or bladder calculi.  Degenerative changes of the visualized thoracolumbar spine.  IMPRESSION: Punctate nonobstructing interpolar right renal calculus.  No ureteral or bladder calculi.  No hydronephrosis.  Normal appendix.  No evidence of bowel obstruction.  Possible eccentric wall thickening in the rectum.  Endoscopic correlation is suggested.   Original Report Authenticated By: Charline Bills, M.D.     IM fentanyl, pain returned IV dilaudid/ zofran  CT and labs/ UA  Recheck - has tenderness with muscle spasm lower left paraspinal region, no LE deficits or incontinence/ saddle paraesthesias. IV valium and repeat dilaudid. On recheck is feeling better. Back pain precautions provided. Plan close PCP follow up - Ct shows some deg changes thoracolumbar spine   MDM  Flank pain/ back pain  Evaluated with labs, UA and Ct scan  Treated IVfs, IV narcotics.   VS and nursing notes reviewed. Condition inproved        Sunnie Nielsen, MD 06/01/12 [phone]

## 2012-05-31 NOTE — ED Notes (Addendum)
Presents with left sided flank pain that radiates to lower abdomen and pelvis, pt also c/o slight pain on right side as well pain began 3 days ago. HE states, "It feels like a kidney stone, but a little worse. I have a screen, but nothing has come out. Pain is worse with movement" Nausea yesterday. Pt is visibly uncomfortable, pain rated 10/10. Reports frequent urination.  States, "It feels like my testicles are heavy and there is some radiation down into my legs that is not constant".

## 2012-06-01 MED ORDER — CYCLOBENZAPRINE HCL 10 MG PO TABS: 10.0000 mg | ORAL_TABLET | Freq: Two times a day (BID) | ORAL | Status: DC | PRN

## 2012-06-01 MED ORDER — HYDROMORPHONE HCL PF 1 MG/ML IJ SOLN
1.0000 mg | Freq: Once | INTRAMUSCULAR | Status: AC
  Administered 2012-06-01: 1 mg via INTRAVENOUS
  Filled 2012-06-01: qty 1

## 2012-06-01 MED ORDER — IBUPROFEN 800 MG PO TABS: 800.0000 mg | ORAL_TABLET | Freq: Three times a day (TID) | ORAL | Status: DC

## 2012-06-01 MED ORDER — OXYCODONE-ACETAMINOPHEN 5-325 MG PO TABS: 2.0000 | ORAL_TABLET | ORAL | Status: DC | PRN

## 2012-06-01 MED ORDER — DIAZEPAM 5 MG/ML IJ SOLN
5.0000 mg | Freq: Once | INTRAMUSCULAR | Status: AC
  Administered 2012-06-01: 5 mg via INTRAVENOUS
  Filled 2012-06-01: qty 2

## 2012-06-01 NOTE — ED Notes (Signed)
The patient is AOx4 and comfortable with his discharge instructions.  His sister is driving him home.

## 2012-06-01 NOTE — ED Notes (Signed)
MD at bedside. 

## 2012-06-07 ENCOUNTER — Other Ambulatory Visit: Payer: Self-pay

## 2013-02-26 ENCOUNTER — Other Ambulatory Visit: Payer: Self-pay

## 2013-03-11 ENCOUNTER — Other Ambulatory Visit: Payer: Self-pay

## 2013-03-12 NOTE — Telephone Encounter (Signed)
Called the patient informed of MD instructions.  The patient will have to call back to schedule appointment.

## 2013-03-12 NOTE — Telephone Encounter (Signed)
Since it has been one yr, and tramadol has not been a chronic med for this pt, please make ROV

## 2013-03-17 ENCOUNTER — Other Ambulatory Visit: Payer: Self-pay

## 2013-03-17 MED ORDER — TRAMADOL HCL 50 MG PO TABS: 50.0000 mg | ORAL_TABLET | Freq: Four times a day (QID) | ORAL | Status: DC | PRN

## 2013-03-17 NOTE — Telephone Encounter (Signed)
Done hardcopy to robin  

## 2013-03-17 NOTE — Telephone Encounter (Signed)
Faxed hardcopy to [facility] [address] [place] 

## 2013-04-03 ENCOUNTER — Other Ambulatory Visit: Payer: Self-pay | Admitting: Internal Medicine

## 2013-05-03 ENCOUNTER — Other Ambulatory Visit: Payer: Self-pay | Admitting: Internal Medicine

## 2013-06-08 ENCOUNTER — Other Ambulatory Visit: Payer: Self-pay | Admitting: Internal Medicine

## 2013-06-12 ENCOUNTER — Telehealth: Payer: Self-pay | Admitting: Internal Medicine

## 2013-06-12 ENCOUNTER — Encounter: Payer: Self-pay | Admitting: Internal Medicine

## 2013-06-12 ENCOUNTER — Ambulatory Visit (INDEPENDENT_AMBULATORY_CARE_PROVIDER_SITE_OTHER): Payer: Self-pay | Admitting: Internal Medicine

## 2013-06-12 VITALS — BP 116/80 | HR 80 | Temp 98.6°F | Ht 69.0 in | Wt 166.0 lb

## 2013-06-12 DIAGNOSIS — Z0000 Encounter for general adult medical examination without abnormal findings: Secondary | ICD-10-CM

## 2013-06-12 DIAGNOSIS — I10 Essential (primary) hypertension: Secondary | ICD-10-CM

## 2013-06-12 MED ORDER — OMEPRAZOLE 20 MG PO CPDR: 20.0000 mg | DELAYED_RELEASE_CAPSULE | Freq: Every day | ORAL | Status: DC

## 2013-06-12 MED ORDER — AMLODIPINE BESYLATE 10 MG PO TABS: 10.0000 mg | ORAL_TABLET | Freq: Every day | ORAL | Status: DC

## 2013-06-12 MED ORDER — CYCLOBENZAPRINE HCL 5 MG PO TABS: 5.0000 mg | ORAL_TABLET | Freq: Three times a day (TID) | ORAL | Status: DC | PRN

## 2013-06-12 NOTE — Progress Notes (Signed)
   Subjective:    Patient ID: Mason GriffonJohnnie A Morales, male    DOB: 03/16/1956, 58 y.o.   MRN: [id number]  HPI  Here to f/u; overall doing ok,  Pt denies chest pain, increased sob or doe, wheezing, orthopnea, PND, increased LE swelling, palpitations, dizziness or syncope.  Pt denies polydipsia, polyuria, or low sugar symptoms such as weakness or confusion improved with po intake.  Pt denies new neurological symptoms such as new headache, or facial or extremity weakness or numbness.   Pt states overall good compliance with meds, Also Down to less than one cig per day, trying to quit. Laid off last yr, now with new job, to have benefits next mo, declines labs or other eval until ins is active due to cost Past Medical History  Diagnosis Date  . Depression   . Generalized headaches   . GERD (gastroesophageal reflux disease)   . Allergy   . Hypertension   . Kidney stones   . Allergic rhinitis, cause unspecified 10/26/2010  . HTN (hypertension) 10/26/2010  . DDD (degenerative disc disease), cervical 10/26/2010  . DDD (degenerative disc disease), lumbar 10/26/2010   Past Surgical History  Procedure Laterality Date  . Mouth surgery  at 58 yo after horse accident    reports that he has been smoking.  He does not have any smokeless tobacco history on file. He reports that he drinks alcohol. He reports that he does not use illicit drugs. family history includes Alcohol abuse in his other; Cancer in his other and other; Diabetes in his other and other; Heart disease in his other; Hyperlipidemia in his other; Hypertension in his other; Stroke in his other and other. Allergies  Allergen Reactions  . Sulfa Antibiotics Nausea And Vomiting   Current Outpatient Prescriptions on File Prior to Visit  Medication Sig Dispense Refill  . aspirin 81 MG EC tablet Take 1 tablet (81 mg total) by mouth daily. Swallow whole.  30 tablet  12  . b complex vitamins capsule Take 1 capsule by mouth daily.        . Multiple  Vitamins-Minerals (MULTIVITAMINS THER. W/MINERALS) TABS Take 1 tablet by mouth daily.        . Omega-3 Fatty Acids (FISH OIL) 1000 MG CAPS Take by mouth 3 (three) times daily.        Marland Kitchen. amLODipine (NORVASC) 10 MG tablet Take 1 tablet (10 mg total) by mouth daily.  90 tablet  3   No current facility-administered medications on file prior to visit.   Review of Systems All otherwise neg per pt     Objective:   Physical Exam BP 116/80  Pulse 80  Temp(Src) 98.6 F (37 C) (Oral)  Ht 5\' 9"  (1.753 m)  Wt 166 lb (75.297 kg)  BMI 24.50 kg/m2  SpO2 98% VS noted,  Constitutional: Pt appears well-developed and well-nourished.  HENT: Head: NCAT.  Right Ear: External ear normal.  Left Ear: External ear normal.  Eyes: Conjunctivae and EOM are normal. Pupils are equal, round, and reactive to light.  Neck: Normal range of motion. Neck supple.  Cardiovascular: Normal rate and regular rhythm.   Pulmonary/Chest: Effort normal and breath sounds normal.  Neurological: Pt is alert. Not confused  Skin: Skin is warm. No erythema.  Psychiatric: Pt behavior is normal. Thought content normal.     Assessment & Plan:

## 2013-06-12 NOTE — Telephone Encounter (Signed)
Patient is calling requesting a prescription for Flexeril. Patient had an OV this morning and says that as he was about to get into his car he slipped on ice and caught himself on his car door. He twisted his back and now has some pain. Wants muscle relaxer. Please advise.

## 2013-06-12 NOTE — Patient Instructions (Addendum)
Please continue all other medications as before, and refills have been done if requested. Please have the pharmacy call with any other refills you may need.  Please remember to sign up for MyChart if you have not done so, as this will be important to you in the future with finding out test results, communicating by private email, and scheduling acute appointments online when needed.  Please stop smoking  Please return in 3 months, or sooner if needed, with Lab testing done 3-5 days before  Good Luck with your new job.

## 2013-06-12 NOTE — Telephone Encounter (Signed)
Patient informed left detailed message 

## 2013-06-12 NOTE — Telephone Encounter (Signed)
Done erx 

## 2013-06-12 NOTE — Progress Notes (Signed)
Pre-visit discussion using our clinic review tool. No additional management support is needed unless otherwise documented below in the visit note.  

## 2013-06-13 NOTE — Assessment & Plan Note (Signed)
stable overall by history and exam, recent data reviewed with pt, and pt to continue medical treatment as before,  to f/u any worsening symptoms or concerns BP Readings from Last 3 Encounters:  06/12/13 116/80  06/01/12 109/75  03/18/12 134/82

## 2013-09-09 ENCOUNTER — Ambulatory Visit: Payer: Self-pay | Admitting: Internal Medicine

## 2013-09-09 DIAGNOSIS — Z0289 Encounter for other administrative examinations: Secondary | ICD-10-CM

## 2013-10-28 ENCOUNTER — Ambulatory Visit: Payer: Self-pay

## 2013-10-28 ENCOUNTER — Other Ambulatory Visit: Payer: Self-pay | Admitting: Occupational Medicine

## 2013-10-28 DIAGNOSIS — Z0000 Encounter for general adult medical examination without abnormal findings: Secondary | ICD-10-CM

## 2013-12-10 ENCOUNTER — Emergency Department (HOSPITAL_COMMUNITY): Payer: No Typology Code available for payment source

## 2013-12-10 ENCOUNTER — Encounter (HOSPITAL_COMMUNITY): Payer: Self-pay | Admitting: Emergency Medicine

## 2013-12-10 ENCOUNTER — Emergency Department (HOSPITAL_COMMUNITY)
Admission: EM | Admit: 2013-12-10 | Discharge: 2013-12-10 | Disposition: A | Discharge status: Home / Self Care | Payer: No Typology Code available for payment source | Attending: Emergency Medicine | Admitting: Emergency Medicine

## 2013-12-10 DIAGNOSIS — IMO0002 Reserved for concepts with insufficient information to code with codable children: Secondary | ICD-10-CM | POA: Insufficient documentation

## 2013-12-10 DIAGNOSIS — S46909A Unspecified injury of unspecified muscle, fascia and tendon at shoulder and upper arm level, unspecified arm, initial encounter: Secondary | ICD-10-CM | POA: Insufficient documentation

## 2013-12-10 DIAGNOSIS — Z8709 Personal history of other diseases of the respiratory system: Secondary | ICD-10-CM | POA: Insufficient documentation

## 2013-12-10 DIAGNOSIS — Y9389 Activity, other specified: Secondary | ICD-10-CM | POA: Insufficient documentation

## 2013-12-10 DIAGNOSIS — S43401A Unspecified sprain of right shoulder joint, initial encounter: Secondary | ICD-10-CM

## 2013-12-10 DIAGNOSIS — I10 Essential (primary) hypertension: Secondary | ICD-10-CM | POA: Insufficient documentation

## 2013-12-10 DIAGNOSIS — Z7982 Long term (current) use of aspirin: Secondary | ICD-10-CM | POA: Insufficient documentation

## 2013-12-10 DIAGNOSIS — F17200 Nicotine dependence, unspecified, uncomplicated: Secondary | ICD-10-CM | POA: Insufficient documentation

## 2013-12-10 DIAGNOSIS — S4980XA Other specified injuries of shoulder and upper arm, unspecified arm, initial encounter: Secondary | ICD-10-CM | POA: Insufficient documentation

## 2013-12-10 DIAGNOSIS — Z87442 Personal history of urinary calculi: Secondary | ICD-10-CM | POA: Insufficient documentation

## 2013-12-10 DIAGNOSIS — Z8739 Personal history of other diseases of the musculoskeletal system and connective tissue: Secondary | ICD-10-CM | POA: Insufficient documentation

## 2013-12-10 DIAGNOSIS — S139XXA Sprain of joints and ligaments of unspecified parts of neck, initial encounter: Secondary | ICD-10-CM | POA: Insufficient documentation

## 2013-12-10 DIAGNOSIS — K219 Gastro-esophageal reflux disease without esophagitis: Secondary | ICD-10-CM | POA: Insufficient documentation

## 2013-12-10 DIAGNOSIS — S161XXA Strain of muscle, fascia and tendon at neck level, initial encounter: Secondary | ICD-10-CM

## 2013-12-10 DIAGNOSIS — Y92410 Unspecified street and highway as the place of occurrence of the external cause: Secondary | ICD-10-CM | POA: Insufficient documentation

## 2013-12-10 DIAGNOSIS — Z79899 Other long term (current) drug therapy: Secondary | ICD-10-CM | POA: Insufficient documentation

## 2013-12-10 DIAGNOSIS — Z791 Long term (current) use of non-steroidal anti-inflammatories (NSAID): Secondary | ICD-10-CM | POA: Insufficient documentation

## 2013-12-10 MED ORDER — NAPROXEN 500 MG PO TABS: 500.0000 mg | ORAL_TABLET | Freq: Two times a day (BID) | ORAL | Status: DC

## 2013-12-10 MED ORDER — CYCLOBENZAPRINE HCL 10 MG PO TABS: 10.0000 mg | ORAL_TABLET | Freq: Two times a day (BID) | ORAL | Status: DC | PRN
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
## 2013-12-10 NOTE — Discharge Instructions (Signed)

## 2014-02-18 ENCOUNTER — Emergency Department (HOSPITAL_COMMUNITY): Payer: Self-pay

## 2014-02-18 ENCOUNTER — Encounter (HOSPITAL_COMMUNITY): Payer: Self-pay | Admitting: Emergency Medicine

## 2014-02-18 ENCOUNTER — Emergency Department (HOSPITAL_COMMUNITY)
Admission: EM | Admit: 2014-02-18 | Discharge: 2014-02-18 | Disposition: A | Discharge status: Home / Self Care | Payer: Self-pay | Attending: Emergency Medicine | Admitting: Emergency Medicine

## 2014-02-18 DIAGNOSIS — Z87442 Personal history of urinary calculi: Secondary | ICD-10-CM | POA: Insufficient documentation

## 2014-02-18 DIAGNOSIS — Z8739 Personal history of other diseases of the musculoskeletal system and connective tissue: Secondary | ICD-10-CM | POA: Insufficient documentation

## 2014-02-18 DIAGNOSIS — N5082 Scrotal pain: Secondary | ICD-10-CM

## 2014-02-18 DIAGNOSIS — I10 Essential (primary) hypertension: Secondary | ICD-10-CM | POA: Insufficient documentation

## 2014-02-18 DIAGNOSIS — R109 Unspecified abdominal pain: Secondary | ICD-10-CM | POA: Insufficient documentation

## 2014-02-18 DIAGNOSIS — R61 Generalized hyperhidrosis: Secondary | ICD-10-CM | POA: Insufficient documentation

## 2014-02-18 DIAGNOSIS — K219 Gastro-esophageal reflux disease without esophagitis: Secondary | ICD-10-CM | POA: Insufficient documentation

## 2014-02-18 DIAGNOSIS — Z7982 Long term (current) use of aspirin: Secondary | ICD-10-CM | POA: Insufficient documentation

## 2014-02-18 DIAGNOSIS — Z8659 Personal history of other mental and behavioral disorders: Secondary | ICD-10-CM | POA: Insufficient documentation

## 2014-02-18 DIAGNOSIS — Z79899 Other long term (current) drug therapy: Secondary | ICD-10-CM | POA: Insufficient documentation

## 2014-02-18 DIAGNOSIS — Z720 Tobacco use: Secondary | ICD-10-CM | POA: Insufficient documentation

## 2014-02-18 LAB — CBC WITH DIFFERENTIAL/PLATELET
Basophils Absolute: 0 10*3/uL (ref 0.0–0.1)
Basophils Relative: 1 % (ref 0–1)
EOS ABS: 0 10*3/uL (ref 0.0–0.7)
EOS PCT: 0 % (ref 0–5)
HEMATOCRIT: 42.1 % (ref 39.0–52.0)
Hemoglobin: 14.4 g/dL (ref 13.0–17.0)
LYMPHS ABS: 2.1 10*3/uL (ref 0.7–4.0)
Lymphocytes Relative: 46 % (ref 12–46)
MCH: 30.2 pg (ref 26.0–34.0)
MCHC: 34.2 g/dL (ref 30.0–36.0)
MCV: 88.3 fL (ref 78.0–100.0)
MONO ABS: 0.3 10*3/uL (ref 0.1–1.0)
MONOS PCT: 7 % (ref 3–12)
Neutro Abs: 2.1 10*3/uL (ref 1.7–7.7)
Neutrophils Relative %: 46 % (ref 43–77)
Platelets: 162 10*3/uL (ref 150–400)
RBC: 4.77 MIL/uL (ref 4.22–5.81)
RDW: 12.5 % (ref 11.5–15.5)
WBC: 4.6 10*3/uL (ref 4.0–10.5)

## 2014-02-18 LAB — I-STAT CHEM 8, ED
BUN: 21 mg/dL (ref 6–23)
CALCIUM ION: 1.16 mmol/L (ref 1.12–1.23)
CREATININE: 1.1 mg/dL (ref 0.50–1.35)
Chloride: 102 mEq/L (ref 96–112)
GLUCOSE: 95 mg/dL (ref 70–99)
HCT: 46 % (ref 39.0–52.0)
HEMOGLOBIN: 15.6 g/dL (ref 13.0–17.0)
Potassium: 3.9 mEq/L (ref 3.7–5.3)
SODIUM: 139 meq/L (ref 137–147)
TCO2: 25 mmol/L (ref 0–100)

## 2014-02-18 LAB — URINALYSIS, ROUTINE W REFLEX MICROSCOPIC
Bilirubin Urine: NEGATIVE
Glucose, UA: NEGATIVE mg/dL
KETONES UR: NEGATIVE mg/dL
Leukocytes, UA: NEGATIVE
Nitrite: NEGATIVE
PROTEIN: NEGATIVE mg/dL
Specific Gravity, Urine: 1.024 (ref 1.005–1.030)
UROBILINOGEN UA: 0.2 mg/dL (ref 0.0–1.0)
pH: 6.5 (ref 5.0–8.0)

## 2014-02-18 LAB — URINE MICROSCOPIC-ADD ON

## 2014-02-18 MED ORDER — HYDROCODONE-ACETAMINOPHEN 5-325 MG PO TABS: 1.0000 | ORAL_TABLET | Freq: Four times a day (QID) | ORAL | Status: DC | PRN

## 2014-02-18 MED ORDER — IBUPROFEN 600 MG PO TABS: 600.0000 mg | ORAL_TABLET | Freq: Four times a day (QID) | ORAL | Status: DC | PRN

## 2014-02-18 MED ORDER — KETOROLAC TROMETHAMINE 30 MG/ML IJ SOLN
30.0000 mg | Freq: Once | INTRAMUSCULAR | Status: AC
  Administered 2014-02-18: 30 mg via INTRAVENOUS
  Filled 2014-02-18: qty 1

## 2014-02-18 MED ORDER — LORAZEPAM 1 MG PO TABS: 1.0000 mg | ORAL_TABLET | Freq: Three times a day (TID) | ORAL | Status: DC | PRN

## 2014-02-18 NOTE — ED Provider Notes (Signed)
CSN: [id number]     Arrival date & time 02/18/14  0919 History   First MD Initiated Contact with Patient 02/18/14 [phone]     Chief Complaint  Patient presents with  . Flank Pain     (Consider location/radiation/quality/duration/timing/severity/associated sxs/prior Treatment) HPI Mason Morales is a 58 y.o. male with history of hypertension, depression, kidney stones, who presents to emergency department complaining of right flank, right lower abdominal, right testicular pain onset 3 days ago. Patient states he has history of kidney stones, states this pain feels the same. He denies any dysuria, urinary frequency, urinary urgency. He states pain feels like "burning." States at times pain is sharp, but mostly just dull. He denies any back pain, no nausea or vomiting. He states the pain is sharp he does get dizzy and sweaty. He did not take any medications for this. He denies any fever, chills, generalized malaise.  Past Medical History  Diagnosis Date  . Depression   . Generalized headaches   . GERD (gastroesophageal reflux disease)   . Allergy   . Hypertension   . Kidney stones   . Allergic rhinitis, cause unspecified 10/26/2010  . HTN (hypertension) 10/26/2010  . DDD (degenerative disc disease), cervical 10/26/2010  . DDD (degenerative disc disease), lumbar 10/26/2010   Past Surgical History  Procedure Laterality Date  . Mouth surgery  at 58 yo after horse accident   Family History  Problem Relation Age of Onset  . Hyperlipidemia Other   . Heart disease Other   . Stroke Other   . Hypertension Other   . Diabetes Other   . Cancer Other     prostate cancer  . Diabetes Other   . Alcohol abuse Other   . Cancer Other     breast cancer  . Stroke Other    History  Substance Use Topics  . Smoking status: Current Every Day Smoker  . Smokeless tobacco: Not on file  . Alcohol Use: Yes     Comment: socially     Review of Systems  Constitutional: Positive for diaphoresis. Negative for  fever and chills.  Respiratory: Negative for cough, chest tightness and shortness of breath.   Cardiovascular: Negative for chest pain, palpitations and leg swelling.  Gastrointestinal: Positive for abdominal pain. Negative for nausea, vomiting, diarrhea and abdominal distention.  Genitourinary: Positive for flank pain and testicular pain. Negative for dysuria, urgency, frequency, hematuria, decreased urine volume, discharge, scrotal swelling and penile pain.  Musculoskeletal: Negative for arthralgias, myalgias, neck pain and neck stiffness.  Skin: Negative for rash.  Allergic/Immunologic: Negative for immunocompromised state.  Neurological: Negative for dizziness, weakness, light-headedness, numbness and headaches.  All other systems reviewed and are negative.     Allergies  Sulfa antibiotics  Home Medications   Prior to Admission medications   Medication Sig Start Date End Date Taking? Authorizing Provider  amLODipine (NORVASC) 10 MG tablet Take 1 tablet (10 mg total) by mouth daily. 06/12/13   Corwin Levins, MD  aspirin 81 MG EC tablet Take 1 tablet (81 mg total) by mouth daily. Swallow whole. 03/18/12   Corwin Levins, MD  b complex vitamins capsule Take 1 capsule by mouth daily.      Historical Provider, MD  cyclobenzaprine (FLEXERIL) 10 MG tablet Take 1 tablet (10 mg total) by mouth 2 (two) times daily as needed for muscle spasms. 12/10/13   Linwood Dibbles, MD  Multiple Vitamins-Minerals (MULTIVITAMINS THER. W/MINERALS) TABS Take 1 tablet by mouth daily.  Historical Provider, MD  naproxen (NAPROSYN) 500 MG tablet Take 1 tablet (500 mg total) by mouth 2 (two) times daily. 12/10/13   Linwood DibblesJon Knapp, MD  Omega-3 Fatty Acids (FISH OIL) 1000 MG CAPS Take by mouth 3 (three) times daily.      Historical Provider, MD  omeprazole (PRILOSEC) 20 MG capsule Take 1 capsule (20 mg total) by mouth daily. 06/12/13   Corwin LevinsJames W John, MD   BP 121/76  Pulse 73  Temp(Src) 97.6 F (36.4 C) (Oral)  Resp 18  SpO2  100% Physical Exam  Nursing note and vitals reviewed. Constitutional: He appears well-developed and well-nourished. No distress.  HENT:  Head: Normocephalic and atraumatic.  Eyes: Conjunctivae are normal.  Neck: Neck supple.  Cardiovascular: Normal rate, regular rhythm and normal heart sounds.   Pulmonary/Chest: Effort normal. No respiratory distress. He has no wheezes. He has no rales.  Abdominal: Soft. Bowel sounds are normal. He exhibits no distension. There is no tenderness. There is no rebound.  Right lower quadrant tenderness, no guarding, no rebound tenderness. Right CVA tenderness.  Genitourinary:  Normal scrotum bilaterally, tenderness over right epididymis. No testicular tenderness. No erythema, swelling, bruising. Cremasteric reflex present  Musculoskeletal: He exhibits no edema.  Neurological: He is alert.  Skin: Skin is warm and dry.    ED Course  Procedures (including critical care time) Labs Review Labs Reviewed  URINALYSIS, ROUTINE W REFLEX MICROSCOPIC - Abnormal; Notable for the following:    APPearance CLOUDY (*)    Hgb urine dipstick TRACE (*)    All other components within normal limits  URINE MICROSCOPIC-ADD ON - Abnormal; Notable for the following:    Crystals CA OXALATE CRYSTALS (*)    All other components within normal limits  CBC WITH DIFFERENTIAL  I-STAT CHEM 8, ED    Imaging Review Ct Abdomen Pelvis Wo Contrast  02/18/2014   CLINICAL DATA:  Right-sided flank pain. Abdominal pain. Pain for 3 days.  EXAM: CT ABDOMEN AND PELVIS WITHOUT CONTRAST  TECHNIQUE: Multidetector CT imaging of the abdomen and pelvis was performed following the standard protocol without IV contrast.  COMPARISON:  CT 06/01/2012.  FINDINGS: Liver normal. Spleen normal. Pancreas normal. No biliary distention. Gallbladder nondistended.  Adrenals normal. There tiny punctate renal calyceal stones, these are unchanged. No evidence of hydronephrosis or obstructing ureteral stone. The bladder  is nondistended. Mild prostate prominence.  No adenopathy.  Aorta normal caliber.  No bowel distention. No free air. Gastric wall thickening is noted. This may be from nondistended state however gastric pathology including gastritis could present in this fashion. No mesenteric mass. No significant hernia.  Mild basilar atelectasis. Heart size normal. Degenerative changes lumbar spine and both hips.  IMPRESSION: 1. Thickening gastric wall. Although this may be from nondistended state. Active gastric pathology including gastritis could present in this fashion. 2. Tiny punctate renal calyceal stones. No evidence of urolithiasis or hydronephrosis. 3. Mild prostate enlargement.   Electronically Signed   By: Maisie Fushomas  Register   On: 02/18/2014 10:53   [place] Scrotum  02/18/2014   CLINICAL DATA:  Scrotal pain  EXAM: SCROTAL ULTRASOUND  DOPPLER ULTRASOUND OF THE TESTICLES  TECHNIQUE: Complete ultrasound examination of the testicles, epididymis, and other scrotal structures was performed. Color and spectral Doppler ultrasound were also utilized to evaluate blood flow to the testicles.  COMPARISON:  None.  FINDINGS: Right testicle  Measurements: 4.2 x 2.5 x 3.7 cm. No mass or microlithiasis visualized.  Left testicle  Measurements: 4.5 x 2.3 x 3.4  cm. No mass or microlithiasis visualized.  Right epididymis:  Normal in size and appearance.  Left epididymis:  Normal in size and appearance.  Hydrocele:  Bilateral hydrocele  Varicocele:  None visualized.  Pulsed Doppler interrogation of both testes demonstrates low resistance arterial and venous waveforms bilaterally.  IMPRESSION: Negative for testicular torsion.  Bilateral hydrocele.   Electronically Signed   By: Marlan Palauharles  Clark M.D.   On: 02/18/2014 13:23   [place] Art/ven Flow Abd Pelv Doppler  02/18/2014   CLINICAL DATA:  Scrotal pain  EXAM: SCROTAL ULTRASOUND  DOPPLER ULTRASOUND OF THE TESTICLES  TECHNIQUE: Complete ultrasound examination of the testicles, epididymis, and other  scrotal structures was performed. Color and spectral Doppler ultrasound were also utilized to evaluate blood flow to the testicles.  COMPARISON:  None.  FINDINGS: Right testicle  Measurements: 4.2 x 2.5 x 3.7 cm. No mass or microlithiasis visualized.  Left testicle  Measurements: 4.5 x 2.3 x 3.4 cm. No mass or microlithiasis visualized.  Right epididymis:  Normal in size and appearance.  Left epididymis:  Normal in size and appearance.  Hydrocele:  Bilateral hydrocele  Varicocele:  None visualized.  Pulsed Doppler interrogation of both testes demonstrates low resistance arterial and venous waveforms bilaterally.  IMPRESSION: Negative for testicular torsion.  Bilateral hydrocele.   Electronically Signed   By: Marlan Palauharles  Clark M.D.   On: 02/18/2014 13:23     EKG Interpretation None      MDM   Final diagnoses:  Right flank pain  Scrotal pain   10:10 AM  Patient seen and examined. Patient with right flank, right lower abdominal, right testicular pain onset 3 days ago. Testicular exam unremarkable except for mild tenderness of epididymis. Swelling, cremasteric reflex present. Will get urinalysis, labs, CT abdomen and pelvis, rule out kidney stone. Patient does have history of kidney stones states still the same. At this time patient appears to be comfortable, does not want any strong pain medications. Will try Toradol.   Patient's CT and labs unremarkable. No evidence of a stone. Given his testicular tenderness ultrasound obtained to rule out torsion or epididymitis. Ultrasound is negative. Patient has no pain at this time. I suspect he may have had a kidney stone which he passed. Will discharge home with outpatient follow-up  Filed Vitals:   02/18/14 1200 02/18/14 1230 02/18/14 1335 02/18/14 1347  BP: 121/73 136/73 116/66 116/66  Pulse: 69 62 64 63  Temp:      TempSrc:      Resp:   12 18  SpO2: 99% 100% 100% 100%     Myriam Jacobsonatyana A Azarah Dacy, PA-C 02/18/14 1559

## 2014-02-18 NOTE — ED Notes (Signed)
Pt with right sided flank and abd pain that feels same as kidney stone x 3 days

## 2014-02-18 NOTE — Discharge Instructions (Signed)
Take pain medications as needed. Follow up if you have any problems. Your CT scan and lab work is good today.   Ureteral Colic Ureteral colic is spasm-like pain from the kidney or the ureter. This is often caused by a kidney stone. The pain is caused by the stone trying to get through the tubes that pass your pee. HOME CARE   Drink enough fluids to keep your pee (urine) clear or pale yellow.  Strain all your pee. A strainer will be provided. Keep anything caught in the strainer and bring it to your doctor. The stone causing the pain may be very small.  Only take medicine as told by your doctor.  Follow up with your doctor as told. GET HELP RIGHT AWAY IF:   Pain is not controlled with medicine.  Pain continues or gets worse.  The pain changes and there is chest or belly (abdominal) pain.  You pass out (faint).  You cannot pee.  You keep throwing up (vomiting).  You have a temperature by mouth above 102 F (38.9 C), not controlled by medicine. MAKE SURE YOU:   Understand these instructions.  Will watch this condition.  Will get help right away if you are not doing well or get worse. Document Released: 09/26/2007 Document Revised: 07/02/2011 Document Reviewed: 09/26/2007 [facility] Patient Information 2015 [place], [place]. This information is not intended to replace advice given to you by your health care provider. Make sure you discuss any questions you have with your health care provider.

## 2014-02-18 NOTE — ED Notes (Signed)
Pt able to give us urine sample. In and out cath not needed at this time.

## 2014-06-13 ENCOUNTER — Other Ambulatory Visit: Payer: Self-pay | Admitting: Internal Medicine

## 2014-09-25 ENCOUNTER — Other Ambulatory Visit: Payer: Self-pay | Admitting: Internal Medicine

## 2014-09-29 ENCOUNTER — Other Ambulatory Visit: Payer: Self-pay | Admitting: Internal Medicine

## 2014-09-30 ENCOUNTER — Telehealth: Payer: Self-pay | Admitting: Internal Medicine

## 2014-09-30 MED ORDER — OMEPRAZOLE 20 MG PO CPDR: 20.0000 mg | DELAYED_RELEASE_CAPSULE | Freq: Every day | ORAL | Status: DC | PRN

## 2014-09-30 MED ORDER — AMLODIPINE BESYLATE 10 MG PO TABS: 10.0000 mg | ORAL_TABLET | Freq: Every day | ORAL | Status: DC

## 2014-09-30 NOTE — Telephone Encounter (Signed)
Patient need a refill of amlodipine, and omeprazole, he ask if he could have seven pills last him until he get back from out of town, please advise

## 2014-11-09 ENCOUNTER — Telehealth: Payer: Self-pay | Admitting: Internal Medicine

## 2014-11-09 MED ORDER — AMLODIPINE BESYLATE 10 MG PO TABS: 10.0000 mg | ORAL_TABLET | Freq: Every day | ORAL | Status: DC

## 2014-11-09 NOTE — Telephone Encounter (Signed)
Pt called in and said that he is completely out of his   amLODipine (NORVASC) 10 MG tablet [[id number]][[id number]]     He setup an appt for the 7/28, he wants to know if we can call in enough to make it to his appt on the 28th?      [facility] [place]

## 2014-11-18 ENCOUNTER — Ambulatory Visit (INDEPENDENT_AMBULATORY_CARE_PROVIDER_SITE_OTHER): Payer: Self-pay | Admitting: Internal Medicine

## 2014-11-18 ENCOUNTER — Encounter: Payer: Self-pay | Admitting: Internal Medicine

## 2014-11-18 ENCOUNTER — Other Ambulatory Visit (INDEPENDENT_AMBULATORY_CARE_PROVIDER_SITE_OTHER): Payer: Self-pay

## 2014-11-18 VITALS — BP 130/84 | HR 77 | Temp 97.6°F | Ht 69.0 in | Wt 173.0 lb

## 2014-11-18 DIAGNOSIS — I10 Essential (primary) hypertension: Secondary | ICD-10-CM

## 2014-11-18 LAB — PSA: PSA: 0.55 ng/mL (ref 0.10–4.00)

## 2014-11-18 MED ORDER — OMEPRAZOLE 20 MG PO CPDR: 20.0000 mg | DELAYED_RELEASE_CAPSULE | Freq: Every day | ORAL | Status: DC | PRN

## 2014-11-18 MED ORDER — AMLODIPINE BESYLATE 10 MG PO TABS: 10.0000 mg | ORAL_TABLET | Freq: Every day | ORAL | Status: DC

## 2014-11-18 NOTE — Progress Notes (Signed)
Pre visit review using our clinic review tool, if applicable. No additional management support is needed unless otherwise documented below in the visit note. 

## 2014-11-18 NOTE — Progress Notes (Signed)
Subjective:    Patient ID: Mason Morales, male    DOB: 1955/10/25, 59 y.o.   MRN: [id number]  HPI  Here to f/u; overall doing ok,  Pt denies chest pain, increasing sob or doe, wheezing, orthopnea, PND, increased LE swelling, palpitations, dizziness or syncope.  Pt denies new neurological symptoms such as new headache, or facial or extremity weakness or numbness.  Pt denies polydipsia, polyuria, or low sugar episode.   Pt denies new neurological symptoms such as new headache, or facial or extremity weakness or numbness.   Pt states overall good compliance with meds, mostly trying to follow appropriate diet, with wt overall stable,  but little exercise however. No new complaints  Wary of cost today as does not have insurance Past Medical History  Diagnosis Date  . Depression   . Generalized headaches   . GERD (gastroesophageal reflux disease)   . Allergy   . Hypertension   . Kidney stones   . Allergic rhinitis, cause unspecified 10/26/2010  . HTN (hypertension) 10/26/2010  . DDD (degenerative disc disease), cervical 10/26/2010  . DDD (degenerative disc disease), lumbar 10/26/2010   Past Surgical History  Procedure Laterality Date  . Mouth surgery  at 59 yo after horse accident    reports that he quit smoking about 7 months ago. He does not have any smokeless tobacco history on file. He reports that he drinks alcohol. He reports that he does not use illicit drugs. family history includes Alcohol abuse in his other; Cancer in his other and other; Diabetes in his other and other; Heart disease in his other; Hyperlipidemia in his other; Hypertension in his other; Stroke in his other and other. Allergies  Allergen Reactions  . Sulfa Antibiotics Nausea And Vomiting   Current Outpatient Prescriptions on File Prior to Visit  Medication Sig Dispense Refill  . aspirin 81 MG EC tablet Take 1 tablet (81 mg total) by mouth daily. Swallow whole. 30 tablet 12  . b complex vitamins capsule Take 1 capsule by  mouth daily.      . Multiple Vitamins-Minerals (MULTIVITAMINS THER. W/MINERALS) TABS Take 1 tablet by mouth daily.      . Multiple Vitamins-Minerals (ZINC PO) Take 1 tablet by mouth daily.    . Omega-3 Fatty Acids (FISH OIL) 1000 MG CAPS Take 1 capsule by mouth daily.     Marland Kitchen HYDROcodone-acetaminophen (NORCO) 5-325 MG per tablet Take 1 tablet by mouth every 6 (six) hours as needed for moderate pain. (Patient not taking: Reported on 11/18/2014) 15 tablet 0  . ibuprofen (ADVIL,MOTRIN) 600 MG tablet Take 1 tablet (600 mg total) by mouth every 6 (six) hours as needed. (Patient not taking: Reported on 11/18/2014) 30 tablet 0   No current facility-administered medications on file prior to visit.   Review of Systems  Constitutional: Negative for unusual diaphoresis or night sweats HENT: Negative for ringing in ear or discharge Eyes: Negative for double vision or worsening visual disturbance.  Respiratory: Negative for choking and stridor.   Gastrointestinal: Negative for vomiting or other signifcant bowel change Genitourinary: Negative for hematuria or change in urine volume.  Musculoskeletal: Negative for other MSK pain or swelling Skin: Negative for color change and worsening wound.  Neurological: Negative for tremors and numbness other than noted  Psychiatric/Behavioral: Negative for decreased concentration or agitation other than above       Objective:   Physical Exam BP 130/84 mmHg  Pulse 77  Temp(Src) 97.6 F (36.4 C) (Oral)  Ht 5'  9" (1.753 m)  Wt 173 lb (78.472 kg)  BMI 25.54 kg/m2  SpO2 98% VS noted,  Constitutional: Pt appears in no significant distress HENT: Head: NCAT.  Right Ear: External ear normal.  Left Ear: External ear normal.  Eyes: . Pupils are equal, round, and reactive to light. Conjunctivae and EOM are normal Neck: Normal range of motion. Neck supple.  Cardiovascular: Normal rate and regular rhythm.   Pulmonary/Chest: Effort normal and breath sounds without rales  or wheezing.  Abd:  Soft, NT, ND, + BS Neurological: Pt is alert. Not confused , motor grossly intact Skin: Skin is warm. No rash, no LE edema Psychiatric: Pt behavior is normal. No agitation.     Assessment & Plan:

## 2014-11-18 NOTE — Assessment & Plan Note (Signed)
stable overall by history and exam, recent data reviewed with pt, and pt to continue medical treatment as before,  to f/u any worsening symptoms or concerns BP Readings from Last 3 Encounters:  11/18/14 130/84  02/18/14 116/66  12/10/13 131/73

## 2014-11-18 NOTE — Patient Instructions (Signed)
Please continue all other medications as before, and refills have been done if requested.  Please have the pharmacy call with any other refills you may need.  Please continue your efforts at being more active, low cholesterol diet, and weight control.  You are otherwise up to date with prevention measures today.  Please keep your appointments with your specialists as you may have planned  Please go to the LAB in the Basement (turn left off the elevator) for the tests to be done today - just the PSA today  You will be contacted by phone if any changes need to be made immediately.  Otherwise, you will receive a letter about your results with an explanation, but please check with MyChart first.  Please remember to sign up for MyChart if you have not done so, as this will be important to you in the future with finding out test results, communicating by private email, and scheduling acute appointments online when needed.  Please return in 1 year for your yearly visit, or sooner if needed

## 2014-12-15 ENCOUNTER — Other Ambulatory Visit: Payer: Self-pay | Admitting: Internal Medicine

## 2015-02-22 ENCOUNTER — Telehealth: Payer: Self-pay | Admitting: *Deleted

## 2015-02-22 NOTE — Telephone Encounter (Signed)
Left msg on triage stating he was riding his horse this pass weekend, and somehow pull a muscle in his back. Wanting to get a refill on tramadol & flexeril that md rx before. Pls advise...Raechel Chute/lmb

## 2015-02-23 MED ORDER — CYCLOBENZAPRINE HCL 5 MG PO TABS: 5.0000 mg | ORAL_TABLET | Freq: Three times a day (TID) | ORAL | Status: DC | PRN

## 2015-02-23 MED ORDER — TRAMADOL HCL 50 MG PO TABS
50.0000 mg | ORAL_TABLET | Freq: Three times a day (TID) | ORAL | Status: DC | PRN
Start: 2015-02-23 — End: 2015-05-09

## 2015-02-23 NOTE — Addendum Note (Signed)
Addended by: Corwin LevinsJOHN, JAMES W on: 02/23/2015 12:57 PM   Modules accepted: Orders, Medications

## 2015-02-23 NOTE — Telephone Encounter (Signed)
[facility]  flexeril [facility] erx

## 2015-02-23 NOTE — Telephone Encounter (Signed)
Pt called back requesting status on refill...Raechel Chute/lmb

## 2015-02-23 NOTE — Telephone Encounter (Signed)
[facility]...Raechel Chute/lmb

## 2015-02-26 ENCOUNTER — Emergency Department (HOSPITAL_COMMUNITY): Payer: Self-pay

## 2015-02-26 ENCOUNTER — Encounter (HOSPITAL_COMMUNITY): Payer: Self-pay | Admitting: *Deleted

## 2015-02-26 ENCOUNTER — Emergency Department (HOSPITAL_COMMUNITY)
Admission: EM | Admit: 2015-02-26 | Discharge: 2015-02-26 | Disposition: A | Discharge status: Home / Self Care | Payer: Self-pay | Attending: Emergency Medicine | Admitting: Emergency Medicine

## 2015-02-26 DIAGNOSIS — R0789 Other chest pain: Secondary | ICD-10-CM | POA: Insufficient documentation

## 2015-02-26 DIAGNOSIS — K219 Gastro-esophageal reflux disease without esophagitis: Secondary | ICD-10-CM | POA: Insufficient documentation

## 2015-02-26 DIAGNOSIS — Z87442 Personal history of urinary calculi: Secondary | ICD-10-CM | POA: Insufficient documentation

## 2015-02-26 DIAGNOSIS — Z7982 Long term (current) use of aspirin: Secondary | ICD-10-CM | POA: Insufficient documentation

## 2015-02-26 DIAGNOSIS — I10 Essential (primary) hypertension: Secondary | ICD-10-CM | POA: Insufficient documentation

## 2015-02-26 DIAGNOSIS — R202 Paresthesia of skin: Secondary | ICD-10-CM | POA: Insufficient documentation

## 2015-02-26 DIAGNOSIS — Z87891 Personal history of nicotine dependence: Secondary | ICD-10-CM | POA: Insufficient documentation

## 2015-02-26 DIAGNOSIS — Z79899 Other long term (current) drug therapy: Secondary | ICD-10-CM | POA: Insufficient documentation

## 2015-02-26 DIAGNOSIS — F329 Major depressive disorder, single episode, unspecified: Secondary | ICD-10-CM | POA: Insufficient documentation

## 2015-02-26 DIAGNOSIS — R0602 Shortness of breath: Secondary | ICD-10-CM | POA: Insufficient documentation

## 2015-02-26 LAB — CBC
HCT: 44 % (ref 39.0–52.0)
Hemoglobin: 15 g/dL (ref 13.0–17.0)
MCH: 29.8 pg (ref 26.0–34.0)
MCHC: 34.1 g/dL (ref 30.0–36.0)
MCV: 87.3 fL (ref 78.0–100.0)
PLATELETS: 208 10*3/uL (ref 150–400)
RBC: 5.04 MIL/uL (ref 4.22–5.81)
RDW: 12.7 % (ref 11.5–15.5)
WBC: 5.3 10*3/uL (ref 4.0–10.5)

## 2015-02-26 LAB — I-STAT TROPONIN, ED
TROPONIN I, POC: 0 ng/mL (ref 0.00–0.08)
TROPONIN I, POC: 0 ng/mL (ref 0.00–0.08)

## 2015-02-26 LAB — BASIC METABOLIC PANEL
ANION GAP: 8 (ref 5–15)
BUN: 17 mg/dL (ref 6–20)
CHLORIDE: 104 mmol/L (ref 101–111)
CO2: 24 mmol/L (ref 22–32)
CREATININE: 1.16 mg/dL (ref 0.61–1.24)
Calcium: 9.3 mg/dL (ref 8.9–10.3)
GFR calc non Af Amer: >60 mL/min (ref >60)
Glucose, Bld: 93 mg/dL (ref 65–99)
POTASSIUM: 3.7 mmol/L (ref 3.5–5.1)
SODIUM: 136 mmol/L (ref 135–145)

## 2015-02-26 MED ORDER — PANTOPRAZOLE SODIUM 40 MG IV SOLR
40.0000 mg | Freq: Once | INTRAVENOUS | Status: AC
  Administered 2015-02-26: 40 mg via INTRAVENOUS
  Filled 2015-02-26: qty 40

## 2015-02-26 MED ORDER — ASPIRIN 81 MG PO CHEW
243.0000 mg | CHEWABLE_TABLET | Freq: Once | ORAL | Status: AC
  Administered 2015-02-26: 243 mg via ORAL

## 2015-02-26 MED ORDER — NITROGLYCERIN 0.4 MG SL SUBL
0.4000 mg | SUBLINGUAL_TABLET | SUBLINGUAL | Status: DC | PRN
  Administered 2015-02-26: 0.4 mg via SUBLINGUAL
  Filled 2015-02-26: qty 1

## 2015-02-26 MED ORDER — SODIUM CHLORIDE 0.9 % IV SOLN: 1000.0000 mL | Freq: Once | INTRAVENOUS | Status: DC

## 2015-02-26 MED ORDER — ASPIRIN 81 MG PO CHEW
162.0000 mg | CHEWABLE_TABLET | Freq: Once | ORAL | Status: DC
  Filled 2015-02-26: qty 2

## 2015-02-26 MED ORDER — IOHEXOL 350 MG/ML SOLN
100.0000 mL | Freq: Once | INTRAVENOUS | Status: AC | PRN
  Administered 2015-02-26: 100 mL via INTRAVENOUS

## 2015-02-26 MED ORDER — PANTOPRAZOLE SODIUM 20 MG PO TBEC: 20.0000 mg | DELAYED_RELEASE_TABLET | Freq: Every day | ORAL | Status: DC

## 2015-02-26 MED ORDER — SODIUM CHLORIDE 0.9 % IV SOLN: 1000.0000 mL | INTRAVENOUS | Status: DC

## 2015-02-26 NOTE — ED Notes (Signed)
Pt continues to c/o pain under left breast.  St's pain is worse with movement.  NTG 1 SL given for pain of 5/10

## 2015-02-26 NOTE — Discharge Instructions (Signed)
Nonspecific Chest Pain  Chest pain can be caused by many different conditions. There is always a chance that your pain could be related to something serious, such as a heart attack or a blood clot in your lungs. Chest pain can also be caused by conditions that are not life-threatening. If you have chest pain, it is very important to follow up with your health care provider. CAUSES  Chest pain can be caused by:  Heartburn.  Pneumonia or bronchitis.  Anxiety or stress.  Inflammation around your heart (pericarditis) or lung (pleuritis or pleurisy).  A blood clot in your lung.  A collapsed lung (pneumothorax). It can develop suddenly on its own (spontaneous pneumothorax) or from trauma to the chest.  Shingles infection (varicella-zoster virus).  Heart attack.  Damage to the bones, muscles, and cartilage that make up your chest wall. This can include:  Bruised bones due to injury.  Strained muscles or cartilage due to frequent or repeated coughing or overwork.  Fracture to one or more ribs.  Sore cartilage due to inflammation (costochondritis). RISK FACTORS  Risk factors for chest pain may include:  Activities that increase your risk for trauma or injury to your chest.  Respiratory infections or conditions that cause frequent coughing.  Medical conditions or overeating that can cause heartburn.  Heart disease or family history of heart disease.  Conditions or health behaviors that increase your risk of developing a blood clot.  Having had chicken pox (varicella zoster). SIGNS AND SYMPTOMS Chest pain can feel like:  Burning or tingling on the surface of your chest or deep in your chest.  Crushing, pressure, aching, or squeezing pain.  Dull or sharp pain that is worse when you move, cough, or take a deep breath.  Pain that is also felt in your back, neck, shoulder, or arm, or pain that spreads to any of these areas. Your chest pain may come and go, or it may stay  constant. DIAGNOSIS Lab tests or other studies may be needed to find the cause of your pain. Your health care provider may have you take a test called an ambulatory ECG (electrocardiogram). An ECG records your heartbeat patterns at the time the test is performed. You may also have other tests, such as:  Transthoracic echocardiogram (TTE). During echocardiography, sound waves are used to create a picture of all of the heart structures and to look at how blood flows through your heart.  Transesophageal echocardiogram (TEE).This is a more advanced imaging test that obtains images from inside your body. It allows your health care provider to see your heart in finer detail.  Cardiac monitoring. This allows your health care provider to monitor your heart rate and rhythm in real time.  Holter monitor. This is a portable device that records your heartbeat and can help to diagnose abnormal heartbeats. It allows your health care provider to track your heart activity for several days, if needed.  Stress tests. These can be done through exercise or by taking medicine that makes your heart beat more quickly.  Blood tests.  Imaging tests. TREATMENT  Your treatment depends on what is causing your chest pain. Treatment may include:  Medicines. These may include:  Acid blockers for heartburn.  Anti-inflammatory medicine.  Pain medicine for inflammatory conditions.  Antibiotic medicine, if an infection is present.  Medicines to dissolve blood clots.  Medicines to treat coronary artery disease.  Supportive care for conditions that do not require medicines. This may include:  Resting.  Applying heat  or cold packs to injured areas. °· Limiting activities until pain decreases. °HOME CARE INSTRUCTIONS °· If you were prescribed an antibiotic medicine, finish it all even if you start to feel better. °· Avoid any activities that bring on chest pain. °· Do not use any tobacco products, including  cigarettes, chewing tobacco, or electronic cigarettes. If you need help quitting, ask your health care provider. °· Do not drink alcohol. °· Take medicines only as directed by your health care provider. °· Keep all follow-up visits as directed by your health care provider. This is important. This includes any further testing if your chest pain does not go away. °· If heartburn is the cause for your chest pain, you may be told to keep your head raised (elevated) while sleeping. This reduces the chance that acid will go from your stomach into your esophagus. °· Make lifestyle changes as directed by your health care provider. These may include: °¨ Getting regular exercise. Ask your health care provider to suggest some activities that are safe for you. °¨ Eating a heart-healthy diet. A registered dietitian can help you to learn healthy eating options. °¨ Maintaining a healthy weight. °¨ Managing diabetes, if necessary. °¨ Reducing stress. °SEEK MEDICAL CARE IF: °· Your chest pain does not go away after treatment. °· You have a rash with blisters on your chest. °· You have a fever. °SEEK IMMEDIATE MEDICAL CARE IF:  °· Your chest pain is worse. °· You have an increasing cough, or you cough up blood. °· You have severe abdominal pain. °· You have severe weakness. °· You faint. °· You have chills. °· You have sudden, unexplained chest discomfort. °· You have sudden, unexplained discomfort in your arms, back, neck, or jaw. °· You have shortness of breath at any time. °· You suddenly start to sweat, or your skin gets clammy. °· You feel nauseous or you vomit. °· You suddenly feel light-headed or dizzy. °· Your heart begins to beat quickly, or it feels like it is skipping beats. °These symptoms may represent a serious problem that is an emergency. Do not wait to see if the symptoms will go away. Get medical help right away. Call your local emergency services (911 in the [place] Do not drive yourself to the hospital. °  °This  information is not intended to replace advice given to you by your health care provider. Make sure you discuss any questions you have with your health care provider. °  °Document Released: 01/17/2005 Document Revised: 04/30/2014 Document Reviewed: 11/13/2013 °[employer]vier Interactive Patient Education ©2016 [employer] °Possible Gastroesophageal Reflux Disease, Adult °Normally, food travels down the esophagus and stays in the stomach to be digested. However, when a person has gastroesophageal reflux disease (GERD), food and stomach acid move back up into the esophagus. When this happens, the esophagus becomes sore and inflamed. Over time, GERD can create small holes (ulcers) in the lining of the esophagus.  °CAUSES °This condition is caused by a problem with the muscle between the esophagus and the stomach (lower esophageal sphincter, or LES). Normally, the LES muscle closes after food passes through the esophagus to the stomach. When the LES is weakened or abnormal, it does not close properly, and that allows food and stomach acid to go back up into the esophagus. The LES can be weakened by certain dietary substances, medicines, and medical conditions, including: °· Tobacco use. °· Pregnancy. °· Having a hiatal hernia. °· Heavy alcohol use. °· Certain foods and beverages, such as coffee,   chocolate, onions, and peppermint. °RISK FACTORS °This condition is more likely to develop in: °· People who have an increased body weight. °· People who have connective tissue disorders. °· People who use NSAID medicines. °SYMPTOMS °Symptoms of this condition include: °· Heartburn. °· Difficult or painful swallowing. °· The feeling of having a lump in the throat. °· A bitter taste in the mouth. °· Bad breath. °· Having a large amount of saliva. °· Having an upset or bloated stomach. °· Belching. °· Chest pain. °· Shortness of breath or wheezing. °· Ongoing (chronic) cough or a night-time cough. °· Wearing away of tooth  enamel. °· Weight loss. °Different conditions can cause chest pain. Make sure to see your health care provider if you experience chest pain. °DIAGNOSIS °Your health care provider will take a medical history and perform a physical exam. To determine if you have mild or severe GERD, your health care provider may also monitor how you respond to treatment. You may also have other tests, including: °· An endoscopy to examine your stomach and esophagus with a small camera. °· A test that measures the acidity level in your esophagus. °· A test that measures how much pressure is on your esophagus. °· A barium swallow or modified barium swallow to show the shape, size, and functioning of your esophagus. °TREATMENT °The goal of treatment is to help relieve your symptoms and to prevent complications. Treatment for this condition may vary depending on how severe your symptoms are. Your health care provider may recommend: °· Changes to your diet. °· Medicine. °· Surgery. °HOME CARE INSTRUCTIONS °Diet °· Follow a diet as recommended by your health care provider. This may involve avoiding foods and drinks such as: °¨ Coffee and tea (with or without caffeine). °¨ Drinks that contain alcohol. °¨ Energy drinks and sports drinks. °¨ Carbonated drinks or sodas. °¨ Chocolate and cocoa. °¨ Peppermint and mint flavorings. °¨ Garlic and onions. °¨ Horseradish. °¨ Spicy and acidic foods, including peppers, chili powder, curry powder, vinegar, hot sauces, and barbecue sauce. °¨ Citrus fruit juices and citrus fruits, such as oranges, lemons, and limes. °¨ Tomato-based foods, such as red sauce, chili, salsa, and pizza with red sauce. °¨ Fried and fatty foods, such as donuts, french fries, potato chips, and high-fat dressings. °¨ High-fat meats, such as hot dogs and fatty cuts of red and white meats, such as rib eye steak, sausage, ham, and bacon. °¨ High-fat dairy items, such as whole milk, butter, and cream cheese. °· Eat small, frequent  meals instead of large meals. °· Avoid drinking large amounts of liquid with your meals. °· Avoid eating meals during the 2-3 hours before bedtime. °· Avoid lying down right after you eat. °· Do not exercise right after you eat. ° General Instructions  °· Pay attention to any changes in your symptoms. °· Take over-the-counter and prescription medicines only as told by your health care provider. Do not take aspirin, ibuprofen, or other NSAIDs unless your health care provider told you to do so. °· Do not use any tobacco products, including cigarettes, chewing tobacco, and e-cigarettes. If you need help quitting, ask your health care provider. °· Wear loose-fitting clothing. Do not wear anything tight around your waist that causes pressure on your abdomen. °· Raise (elevate) the head of your bed 6 inches (15cm). °· Try to reduce your stress, such as with yoga or meditation. If you need help reducing stress, ask your health care provider. °· If you are overweight, reduce your weight to an amount that is   healthy for you. Ask your health care provider for guidance about a safe weight loss goal.  Keep all follow-up visits as told by your health care provider. This is important. SEEK MEDICAL CARE IF:  You have new symptoms.  You have unexplained weight loss.  You have difficulty swallowing, or it hurts to swallow.  You have wheezing or a persistent cough.  Your symptoms do not improve with treatment.  You have a hoarse voice. SEEK IMMEDIATE MEDICAL CARE IF:  You have pain in your arms, neck, jaw, teeth, or back.  You feel sweaty, dizzy, or light-headed.  You have chest pain or shortness of breath.  You vomit and your vomit looks like blood or coffee grounds.  You faint.  Your stool is bloody or black.  You cannot swallow, drink, or eat.   This information is not intended to replace advice given to you by your health care provider. Make sure you discuss any questions you have with your health  care provider.   Document Released: 01/17/2005 Document Revised: 12/29/2014 Document Reviewed: 08/04/2014 Elsevier Interactive Patient Education 2016 [employer]  Possible Chest Wall Pain Chest wall pain is pain in or around the bones and muscles of your chest. Sometimes, an injury causes this pain. Sometimes, the cause may not be known. This pain may take several weeks or longer to get better. HOME CARE INSTRUCTIONS  Pay attention to any changes in your symptoms. Take these actions to help with your pain:   Rest as told by your health care provider.   Avoid activities that cause pain. These include any activities that use your chest muscles or your abdominal and side muscles to lift heavy items.   If directed, apply ice to the painful area:  Put ice in a plastic bag.  Place a towel between your skin and the bag.  Leave the ice on for 20 minutes, 2-3 times per day.  Take over-the-counter and prescription medicines only as told by your health care provider.  Do not use tobacco products, including cigarettes, chewing tobacco, and e-cigarettes. If you need help quitting, ask your health care provider.  Keep all follow-up visits as told by your health care provider. This is important. SEEK MEDICAL CARE IF:  You have a fever.  Your chest pain becomes worse.  You have new symptoms. SEEK IMMEDIATE MEDICAL CARE IF:  You have nausea or vomiting.  You feel sweaty or light-headed.  You have a cough with phlegm (sputum) or you cough up blood.  You develop shortness of breath.   This information is not intended to replace advice given to you by your health care provider. Make sure you discuss any questions you have with your health care provider.   Document Released: 04/09/2005 Document Revised: 12/29/2014 Document Reviewed: 07/05/2014 [employer] Interactive Patient Education [employer]

## 2015-02-26 NOTE — ED Notes (Signed)
Pt reports onset this am of chest pain across his chest like a band, also has discomfort into his right jaw, neck and arm. Reports mild sob and nausea.

## 2015-02-26 NOTE — ED Provider Notes (Signed)
CSN: [id number]     Arrival date & time 02/26/15  1435 History   First MD Initiated Contact with Patient 02/26/15 1800     Chief Complaint  Patient presents with  . Chest Pain  . Shortness of Breath     (Consider location/radiation/quality/duration/timing/severity/associated sxs/prior Treatment) HPI The patient reports that he was waiting in line to vote and began developing a left-sided chest pain with some shortness of breath and nausea. He reports he also had paresthesia sensation on his right jaw and neck. Patient did not have any syncopal episode. No vomiting. The symptoms have been persistent since onset. He reports here is somewhat worse by pushing into the left upper abdomen and lower chest. He denies lower extremity swelling or calf pain. No prior history of heart attack. Patient reports he felt well before the symptoms began.  Social history; patient quit smoking within the past year. Previous to this he has long smoking history. Family history positive for parental coronary artery disease. Past Medical History  Diagnosis Date  . Depression   . Generalized headaches   . GERD (gastroesophageal reflux disease)   . Allergy   . Hypertension   . Kidney stones   . Allergic rhinitis, cause unspecified 10/26/2010  . HTN (hypertension) 10/26/2010  . DDD (degenerative disc disease), cervical 10/26/2010  . DDD (degenerative disc disease), lumbar 10/26/2010   Past Surgical History  Procedure Laterality Date  . Mouth surgery  at 59 yo after horse accident   Family History  Problem Relation Age of Onset  . Hyperlipidemia Other   . Heart disease Other   . Stroke Other   . Hypertension Other   . Diabetes Other   . Cancer Other     prostate cancer  . Diabetes Other   . Alcohol abuse Other   . Cancer Other     breast cancer  . Stroke Other    Social History  Substance Use Topics  . Smoking status: Former Smoker    Quit date: 03/20/2014  . Smokeless tobacco: None  . Alcohol Use:  0.0 oz/week    0 Standard drinks or equivalent per week     Comment: socially     Review of Systems  10 Systems reviewed and are negative for acute change except as noted in the HPI.   Allergies  Sulfa antibiotics  Home Medications   Prior to Admission medications   Medication Sig Start Date End Date Taking? Authorizing Provider  amLODipine (NORVASC) 10 MG tablet Take 1 tablet (10 mg total) by mouth daily. 11/18/14  Yes Corwin Levins, MD  aspirin 81 MG EC tablet Take 1 tablet (81 mg total) by mouth daily. Swallow whole. 03/18/12  Yes Corwin Levins, MD  b complex vitamins capsule Take 1 capsule by mouth daily after lunch.    Yes Historical Provider, MD  cyclobenzaprine (FLEXERIL) 5 MG tablet Take 1 tablet (5 mg total) by mouth 3 (three) times daily as needed for muscle spasms. 02/23/15  Yes Corwin Levins, MD  Multiple Vitamin (MULTIVITAMIN WITH MINERALS) TABS tablet Take 1 tablet by mouth daily after lunch.   Yes Historical Provider, MD  Multiple Vitamins-Minerals (ZINC PO) Take 1 tablet by mouth daily after lunch.    Yes Historical Provider, MD  Omega-3 Fatty Acids (OMEGA 3 PO) Take 1 capsule by mouth daily after lunch.   Yes Historical Provider, MD  omeprazole (PRILOSEC) 20 MG capsule Take 1 capsule (20 mg total) by mouth daily as needed (for  heartburn). 11/18/14  Yes Corwin Levins, MD  OVER THE COUNTER MEDICATION Take 1 capsule by mouth daily after lunch. Omega 6   Yes Historical Provider, MD  OVER THE COUNTER MEDICATION Take 1 tablet by mouth daily after lunch. Vitamin B   Yes Historical Provider, MD  Tetrahydrozoline HCl (VISINE OP) Place 1 drop into both eyes daily as needed (allergies).   Yes Historical Provider, MD  amLODipine (NORVASC) 10 MG tablet TAKE 1 TABLET (10 MG TOTAL) BY MOUTH DAILY. Patient not taking: Reported on 02/26/2015 12/15/14   Corwin Levins, MD  omeprazole (PRILOSEC) 20 MG capsule TAKE 1 CAPSULE (20 MG TOTAL) BY MOUTH DAILY AS NEEDED (FOR HEARTBURN). Patient not taking:  Reported on 02/26/2015 12/15/14   Corwin Levins, MD  pantoprazole (PROTONIX) 20 MG tablet Take 1 tablet (20 mg total) by mouth daily. 02/26/15   Arby Barrette, MD  traMADol (ULTRAM) 50 MG tablet Take 1 tablet (50 mg total) by mouth every 8 (eight) hours as needed. Patient taking differently: Take 50 mg by mouth every 8 (eight) hours as needed (pain).  02/23/15   Corwin Levins, MD   BP 127/69 mmHg  Pulse 67  Temp(Src) 98 F (36.7 C) (Oral)  Resp 15  SpO2 100% Physical Exam  Constitutional: He is oriented to person, place, and time. He appears well-developed and well-nourished.  HENT:  Head: Normocephalic and atraumatic.  Eyes: EOM are normal. Pupils are equal, round, and reactive to light.  Neck: Neck supple.  Cardiovascular: Normal rate, regular rhythm, normal heart sounds and intact distal pulses.   Pulmonary/Chest: Effort normal and breath sounds normal. He exhibits tenderness (left chest wall tenderness palpation. No rash, contusion or crepitus.).  Abdominal: Soft. Bowel sounds are normal. He exhibits no distension. There is tenderness.  Patient has discomfort to palpation in the epigastrium.  Musculoskeletal: Normal range of motion. He exhibits no edema.  Neurological: He is alert and oriented to person, place, and time. He has normal strength. Coordination normal. GCS eye subscore is 4. GCS verbal subscore is 5. GCS motor subscore is 6.  Skin: Skin is warm, dry and intact.  Psychiatric: He has a normal mood and affect.    ED Course  Procedures (including critical care time) Labs Review Labs Reviewed  BASIC METABOLIC PANEL  CBC  I-STAT TROPOININ, ED  Rosezena Sensor, ED    Imaging Review Dg Chest 2 View  02/26/2015  CLINICAL DATA:  59 year old male with a history of chest tightness EXAM: CHEST - 2 VIEW COMPARISON:  Seventy-two thousand fifteen FINDINGS: Cardiomediastinal silhouette projects within normal limits in size and contour. Chronic interstitial opacities. No confluent  airspace disease, pneumothorax, or pleural effusion. No displaced fracture. Unremarkable appearance of the upper abdomen. IMPRESSION: No radiographic evidence of acute cardiopulmonary disease. Signed, Yvone Neu. Loreta Ave, DO [facility] Radiology Electronically Signed   By: Gilmer Mor D.O.   On: 02/26/2015 15:33   Ct Angio Abdomen W/cm &/or Wo Contrast  02/26/2015  CLINICAL DATA:  Chest pain, epigastric pain common nausea, jaw pain for 2 days EXAM: CT ANGIOGRAPHY CHEST AND ABDOMEN TECHNIQUE: Multidetector CT imaging of the chest and abdomen was performed using the standard protocol during bolus administration of intravenous contrast. Multiplanar CT image reconstructions and MIPs were obtained to evaluate the vascular anatomy. CONTRAST:  OMNIPAQUE IOHEXOL 350 MG/ML SOLN COMPARISON:  CT abdomen 02/18/2014 FINDINGS: CTA CHEST FINDINGS Mediastinum/Nodes: No chest wall mass, supraclavicular or axillary adenopathy. The thyroid gland is  grossly normal. The heart is normal in size.  No pericardial effusion. The aorta is normal in caliber. No dissection. No atherosclerotic changes. The branch vessels are patent. No coronary artery calcifications. The pulmonary arterial tree is well opacified. No filling defects to suggest pulmonary embolism. No mediastinal or hilar mass or adenopathy. Small scattered lymph nodes are noted. The esophagus is grossly normal. Lungs/Pleura: The lungs are clear. No pulmonary lesions or pleural effusion. No bronchiectasis or interstitial lung disease. Musculoskeletal: Moderate to advanced degenerative changes involving the thoracic spine for age. No acute bony findings. Review of the MIP images confirms the above findings. CTA ABDOMEN FINDINGS The abdominal aorta is normal. No dissection. The branch vessels are normal. The solid abdominal organs are unremarkable. The gallbladder is normal. No common bile duct dilatation. No mesenteric or  retroperitoneal mass or adenopathy. The stomach, duodenum, visualized small bowel and visualized colon are grossly normal. The terminal ileum is normal. The appendix is normal. Sigmoid diverticulosis is noted. Degenerative changes involving the spine but no significant bony findings. Review of the MIP images confirms the above findings. IMPRESSION: Normal CTA of the chest and abdomen. Normal aorta and branch vessels. No CT findings for pulmonary embolism. Normal appearance of the heart and great vessels. No pulmonary abnormalities. No acute abdominal/ upper pelvic findings. Electronically Signed   By: Rudie Meyer M.D.   On: 02/26/2015 21:28   Ct Angio Chest Aorta W/cm &/or Wo/cm  02/26/2015  CLINICAL DATA:  Chest pain, epigastric pain common nausea, jaw pain for 2 days EXAM: CT ANGIOGRAPHY CHEST AND ABDOMEN TECHNIQUE: Multidetector CT imaging of the chest and abdomen was performed using the standard protocol during bolus administration of intravenous contrast. Multiplanar CT image reconstructions and MIPs were obtained to evaluate the vascular anatomy. CONTRAST:  OMNIPAQUE IOHEXOL 350 MG/ML SOLN COMPARISON:  CT abdomen 02/18/2014 FINDINGS: CTA CHEST FINDINGS Mediastinum/Nodes: No chest wall mass, supraclavicular or axillary adenopathy. The thyroid gland is grossly normal. The heart is normal in size.  No pericardial effusion. The aorta is normal in caliber. No dissection. No atherosclerotic changes. The branch vessels are patent. No coronary artery calcifications. The pulmonary arterial tree is well opacified. No filling defects to suggest pulmonary embolism. No mediastinal or hilar mass or adenopathy. Small scattered lymph nodes are noted. The esophagus is grossly normal. Lungs/Pleura: The lungs are clear. No pulmonary lesions or pleural effusion. No bronchiectasis or interstitial lung disease. Musculoskeletal: Moderate to advanced degenerative changes involving the thoracic spine for age. No acute bony  findings. Review of the MIP images confirms the above findings. CTA ABDOMEN FINDINGS The abdominal aorta is normal. No dissection. The branch vessels are normal. The solid abdominal organs are unremarkable. The gallbladder is normal. No common bile duct dilatation. No mesenteric or retroperitoneal mass or adenopathy. The stomach, duodenum, visualized small bowel and visualized colon are grossly normal. The terminal ileum is normal. The appendix is normal. Sigmoid diverticulosis is noted. Degenerative changes involving the spine but no significant bony findings. Review of the MIP images confirms the above findings. IMPRESSION: Normal CTA of the chest and abdomen. Normal aorta and branch vessels. No CT findings for pulmonary embolism. Normal appearance of the heart and great vessels. No pulmonary abnormalities. No acute abdominal/ upper pelvic findings. Electronically Signed   By: Rudie Meyer M.D.   On: 02/26/2015 21:28   I have personally reviewed and evaluated these images and lab results as part of my medical decision-making.   EKG Interpretation   Date/Time:  Saturday  February 26 2015 18:52:49 EDT Ventricular Rate:  74 PR Interval:  168 QRS Duration: 78 QT Interval:  396 QTC Calculation: 439 R Axis:   79 Text Interpretation:  Sinus arrhythmia Borderline T wave abnormalities  Confirmed by Donnald GarrePfeiffer, MD, Lebron ConnersMarcy [phone]([id number]) on 02/26/2015 7:32:24 PM     Cardiology consult. Patient's case reviewed Dr. Clarise CruzBenshimon. Patient has had 2 sets of negative cardiac enzymes. He does advise for obtaining CT angiogram to rule patient out for aortic pathology. MDM   Final diagnoses:  Other chest pain  Paresthesias   Patient had chest pain that persisted for a number of hours. Cardiac enzymes are negative for acute ischemic changes. CT angiogram of the chest does not show pathology. Patient does have significantly reproducible component to his chest pain. Patient is counseled for follow-up with his family physician  and cardiology to schedule outpatient stress testing due to risk factors. Patient will continue daily aspirin and begin Protonix.    Arby BarretteMarcy Arvilla Salada, MD 02/27/15 [phone]

## 2015-02-26 NOTE — ED Notes (Signed)
Pt st's pain is now a 4/10.  2nd NTG SL given   B/P  141/87  P 76

## 2015-02-26 NOTE — ED Notes (Signed)
Gave pt Mal[place]key sandwich and Sprite, per Dr. Donnald GarrePfeiffer.

## 2015-05-04 ENCOUNTER — Emergency Department (HOSPITAL_COMMUNITY): Payer: Self-pay

## 2015-05-04 ENCOUNTER — Encounter (HOSPITAL_COMMUNITY): Payer: Self-pay | Admitting: Emergency Medicine

## 2015-05-04 DIAGNOSIS — R0602 Shortness of breath: Secondary | ICD-10-CM | POA: Insufficient documentation

## 2015-05-04 DIAGNOSIS — M5412 Radiculopathy, cervical region: Secondary | ICD-10-CM | POA: Insufficient documentation

## 2015-05-04 DIAGNOSIS — Z87442 Personal history of urinary calculi: Secondary | ICD-10-CM | POA: Insufficient documentation

## 2015-05-04 DIAGNOSIS — Z8659 Personal history of other mental and behavioral disorders: Secondary | ICD-10-CM | POA: Insufficient documentation

## 2015-05-04 DIAGNOSIS — Z79899 Other long term (current) drug therapy: Secondary | ICD-10-CM | POA: Insufficient documentation

## 2015-05-04 DIAGNOSIS — M7989 Other specified soft tissue disorders: Secondary | ICD-10-CM | POA: Insufficient documentation

## 2015-05-04 DIAGNOSIS — Z87891 Personal history of nicotine dependence: Secondary | ICD-10-CM | POA: Insufficient documentation

## 2015-05-04 DIAGNOSIS — K219 Gastro-esophageal reflux disease without esophagitis: Secondary | ICD-10-CM | POA: Insufficient documentation

## 2015-05-04 DIAGNOSIS — Z7982 Long term (current) use of aspirin: Secondary | ICD-10-CM | POA: Insufficient documentation

## 2015-05-04 DIAGNOSIS — M79661 Pain in right lower leg: Secondary | ICD-10-CM | POA: Insufficient documentation

## 2015-05-04 DIAGNOSIS — I10 Essential (primary) hypertension: Secondary | ICD-10-CM | POA: Insufficient documentation

## 2015-05-04 LAB — BASIC METABOLIC PANEL
Anion gap: 10 (ref 5–15)
BUN: 10 mg/dL (ref 6–20)
CALCIUM: 9.3 mg/dL (ref 8.9–10.3)
CO2: 25 mmol/L (ref 22–32)
CREATININE: 1.15 mg/dL (ref 0.61–1.24)
Chloride: 104 mmol/L (ref 101–111)
GFR calc Af Amer: >60 mL/min (ref >60)
Glucose, Bld: 95 mg/dL (ref 65–99)
Potassium: 3.5 mmol/L (ref 3.5–5.1)
Sodium: 139 mmol/L (ref 135–145)

## 2015-05-04 LAB — CBC
HEMATOCRIT: 43.7 % (ref 39.0–52.0)
HEMOGLOBIN: 14.8 g/dL (ref 13.0–17.0)
MCH: 30 pg (ref 26.0–34.0)
MCHC: 33.9 g/dL (ref 30.0–36.0)
MCV: 88.6 fL (ref 78.0–100.0)
Platelets: 200 10*3/uL (ref 150–400)
RBC: 4.93 MIL/uL (ref 4.22–5.81)
RDW: 12.7 % (ref 11.5–15.5)
WBC: 7.1 10*3/uL (ref 4.0–10.5)

## 2015-05-04 LAB — I-STAT TROPONIN, ED: TROPONIN I, POC: 0.01 ng/mL (ref 0.00–0.08)

## 2015-05-04 NOTE — ED Notes (Signed)
Pt states over the last 3 days he has became short of breath worse on exertion. Pt also states he has been having some pain behind right knee and into calf for 1 week. No swelling or redness is noted. Pedal pulses are +2 equal bilaterally. Pt denies any chest pain. Pt is warm and dry.

## 2015-05-05 ENCOUNTER — Encounter (HOSPITAL_COMMUNITY): Payer: Self-pay | Admitting: Emergency Medicine

## 2015-05-05 ENCOUNTER — Emergency Department (HOSPITAL_COMMUNITY)
Admission: EM | Admit: 2015-05-05 | Discharge: 2015-05-05 | Disposition: A | Discharge status: Home / Self Care | Payer: Self-pay | Attending: Emergency Medicine | Admitting: Emergency Medicine

## 2015-05-05 DIAGNOSIS — R0602 Shortness of breath: Secondary | ICD-10-CM

## 2015-05-05 DIAGNOSIS — M5412 Radiculopathy, cervical region: Secondary | ICD-10-CM

## 2015-05-05 LAB — D-DIMER, QUANTITATIVE (NOT AT ARMC)

## 2015-05-05 MED ORDER — KETOROLAC TROMETHAMINE 60 MG/2ML IM SOLN
30.0000 mg | Freq: Once | INTRAMUSCULAR | Status: AC
  Administered 2015-05-05: 30 mg via INTRAMUSCULAR
  Filled 2015-05-05: qty 2

## 2015-05-05 MED ORDER — METHYLPREDNISOLONE 4 MG PO TBPK: ORAL_TABLET | ORAL | Status: DC

## 2015-05-05 NOTE — ED Provider Notes (Signed)
CSN: [id number]     Arrival date & time 05/04/15  2017 History   By signing my name below, I, Mason Morales, attest that this documentation has been prepared under the direction and in the presence of Mason Batonourtney F Horton, MD. Electronically Signed: Bethel BornBritney Morales, ED Scribe. 05/05/2015. 2:35 AM   Chief Complaint  Patient presents with  . Shortness of Breath  . Leg Pain   The history is provided by the patient. No language interpreter was used.   Mason Morales is a 60 y.o. male with history of HTN and DDD (lumbar and cervical) who presents to the Emergency Department complaining of intermittent SOB with onset 3 weeks ago. The episodes of SOB typically happen at night when he is laying down. Walking outdoors typically improves his breathing. Associated symptoms include 5/10 in severity right calf/ankle pain and swelling. Also complains of right sided numbness/pain (neck, RUE, and RLE) and an episode of nausea. He rates the neck pain 3/10 in severity and notes that it is worse with turning the neck. He took nothing for pain at home.  Pt denies fever, chills, chest pain . No history of DVT/PE. No recent long travel by car or plane, surgery or hospitalization, or bone fracture.  Past Medical History  Diagnosis Date  . Depression   . Generalized headaches   . GERD (gastroesophageal reflux disease)   . Allergy   . Hypertension   . Kidney stones   . Allergic rhinitis, cause unspecified 10/26/2010  . HTN (hypertension) 10/26/2010  . DDD (degenerative disc disease), cervical 10/26/2010  . DDD (degenerative disc disease), lumbar 10/26/2010   Past Surgical History  Procedure Laterality Date  . Mouth surgery  at 60 yo after horse accident   Family History  Problem Relation Age of Onset  . Hyperlipidemia Other   . Heart disease Other   . Stroke Other   . Hypertension Other   . Diabetes Other   . Cancer Other     prostate cancer  . Diabetes Other   . Alcohol abuse Other   . Cancer Other      breast cancer  . Stroke Other    Social History  Substance Use Topics  . Smoking status: Former Smoker    Quit date: 03/20/2014  . Smokeless tobacco: None  . Alcohol Use: 0.0 oz/week    0 Standard drinks or equivalent per week     Comment: socially     Review of Systems  Respiratory: Positive for shortness of breath.   Cardiovascular: Negative for chest pain.  Musculoskeletal: Positive for neck pain.       Right leg swelling  Neurological: Positive for numbness. Negative for weakness.  All other systems reviewed and are negative.   Allergies  Sulfa antibiotics  Home Medications   Prior to Admission medications   Medication Sig Start Date End Date Taking? Authorizing Provider  amLODipine (NORVASC) 10 MG tablet Take 1 tablet (10 mg total) by mouth daily. 11/18/14  Yes Corwin LevinsJames W John, MD  aspirin 81 MG EC tablet Take 1 tablet (81 mg total) by mouth daily. Swallow whole. 03/18/12  Yes Corwin LevinsJames W John, MD  b complex vitamins capsule Take 1 capsule by mouth daily after lunch.    Yes Historical Provider, MD  L-ARGININE PO Take 1 capsule by mouth daily.   Yes Historical Provider, MD  Multiple Vitamin (MULTIVITAMIN WITH MINERALS) TABS tablet Take 1 tablet by mouth daily after lunch.   Yes Historical Provider, MD  Omega-3  Fatty Acids (OMEGA 3 PO) Take 1 capsule by mouth daily after lunch.   Yes Historical Provider, MD  omeprazole (PRILOSEC) 20 MG capsule Take 1 capsule (20 mg total) by mouth daily as needed (for heartburn). 11/18/14  Yes Corwin Levins, MD  Tetrahydrozoline HCl (VISINE OP) Place 1 drop into both eyes daily as needed (allergies).   Yes Historical Provider, MD  thiamine 100 MG tablet Take 100 mg by mouth daily.   Yes Historical Provider, MD  amLODipine (NORVASC) 10 MG tablet TAKE 1 TABLET (10 MG TOTAL) BY MOUTH DAILY. Patient not taking: Reported on 02/26/2015 12/15/14   Corwin Levins, MD  cyclobenzaprine (FLEXERIL) 5 MG tablet Take 1 tablet (5 mg total) by mouth 3 (three) times  daily as needed for muscle spasms. Patient not taking: Reported on 05/05/2015 02/23/15   Corwin Levins, MD  methylPREDNISolone (MEDROL DOSEPAK) 4 MG TBPK tablet Take as directed on packet 05/05/15   Mason Baton, MD  omeprazole (PRILOSEC) 20 MG capsule TAKE 1 CAPSULE (20 MG TOTAL) BY MOUTH DAILY AS NEEDED (FOR HEARTBURN). Patient not taking: Reported on 02/26/2015 12/15/14   Corwin Levins, MD  pantoprazole (PROTONIX) 20 MG tablet Take 1 tablet (20 mg total) by mouth daily. Patient not taking: Reported on 05/05/2015 02/26/15   Arby Barrette, MD  traMADol (ULTRAM) 50 MG tablet Take 1 tablet (50 mg total) by mouth every 8 (eight) hours as needed. Patient not taking: Reported on 05/05/2015 02/23/15   Corwin Levins, MD   BP 122/78 mmHg  Pulse 61  Temp(Src) 98.4 F (36.9 C) (Oral)  Resp 18  Ht 5\' 9"  (1.753 m)  Wt 178 lb (80.74 kg)  BMI 26.27 kg/m2  SpO2 99% Physical Exam  Constitutional: He is oriented to person, place, and time. He appears well-developed and well-nourished. No distress.  HENT:  Head: Normocephalic and atraumatic.  Neck: Normal range of motion. Neck supple.  No torticollis  Cardiovascular: Normal rate, regular rhythm and normal heart sounds.   No murmur heard. Pulmonary/Chest: Effort normal and breath sounds normal. No respiratory distress. He has no wheezes.  Abdominal: Soft. Bowel sounds are normal. There is no tenderness. There is no rebound.  Musculoskeletal: He exhibits no edema.  No appreciable asymmetric edema, tenderness to palpation over the right calf, normal range of motion of the right arm and shoulder  Neurological: He is alert and oriented to person, place, and time.  5 out of 5 biceps, triceps, deltoid, and grip strength bilaterally, sensation grossly intact  Skin: Skin is warm and dry.  Psychiatric: He has a normal mood and affect.  Nursing note and vitals reviewed.   ED Course  Procedures (including critical care time) DIAGNOSTIC STUDIES: Oxygen  Saturation is 99% on RA,  normal by my interpretation.    COORDINATION OF CARE: 2:33 AM Discussed treatment plan which includes lab work, CXR, EKG, steroids and Toradol with pt at bedside and pt agreed to plan.  Labs Review Labs Reviewed  BASIC METABOLIC PANEL  CBC  D-DIMER, QUANTITATIVE (NOT AT [facility])  Rosezena Sensor, ED    Imaging Review Dg Chest 2 View  05/04/2015  CLINICAL DATA:  Shortness of breath for 5 days. Right neck and shoulder pain. Hypertension. EXAM: CHEST  2 VIEW COMPARISON:  02/26/2015 FINDINGS: The heart size and mediastinal contours are within normal limits. Both lungs are clear. The visualized skeletal structures are unremarkable. IMPRESSION: No active cardiopulmonary disease. Electronically Signed   By: Alver Sorrow.D.  On: 05/04/2015 21:13   I have personally reviewed and evaluated these images and lab results as part of my medical decision-making.   EKG Interpretation   Date/Time:  Wednesday May 04 2015 20:29:06 EST Ventricular Rate:  79 PR Interval:  146 QRS Duration: 70 QT Interval:  382 QTC Calculation: 438 R Axis:   63 Text Interpretation:  Sinus rhythm with Premature atrial complexes  Nonspecific T wave abnormality Abnormal ECG Confirmed by HORTON  MD,  COURTNEY ([id number]) on 05/05/2015 12:11:09 AM      MDM   Final diagnoses:  Shortness of breath  Cervical radicular pain    Patient presents with shortness of breath and leg pain.  Denies chest pain. Nontoxic on exam. Afebrile.  History of hypertension but otherwise low risk for ACS. EKG reassuring and nonischemic. Chest x-ray negative. Initial troponin is negative. Given leg pain, d-dimer was sent and is negative. Low suspicion for DVT or PE at this time. Patient also reports neck pain and arm tingling. Pain is worse with range of motion of the neck. He is neurovascularly and neurologically intact. Suspect cervical radiculopathy. Discuss with patient taking Medrol Dosepak.  He needs to follow-up  with his primary physician. He will also be given cardiology follow-up given the exertional component of his shortness of breath.  After history, exam, and medical workup I feel the patient has been appropriately medically screened and is safe for discharge home. Pertinent diagnoses were discussed with the patient. Patient was given return precautions.  I personally performed the services described in this documentation, which was scribed in my presence. The recorded information has been reviewed and is accurate.    Mason Baton, MD 05/05/15 [phone]

## 2015-05-05 NOTE — ED Notes (Signed)
Pt left with all his belongings and ambulated out of the treatment area.  

## 2015-05-05 NOTE — Discharge Instructions (Signed)
Cervical Radiculopathy Cervical radiculopathy happens when a nerve in the neck (cervical nerve) is pinched or bruised. This condition can develop because of an injury or as part of the normal aging process. Pressure on the cervical nerves can cause pain or numbness that runs from the neck all the way down into the arm and fingers. Usually, this condition gets better with rest. Treatment may be needed if the condition does not improve.  CAUSES This condition may be caused by:  Injury.  Slipped (herniated) disk.  Muscle tightness in the neck because of overuse.  Arthritis.  Breakdown or degeneration in the bones and joints of the spine (spondylosis) due to aging.  Bone spurs that may develop near the cervical nerves. SYMPTOMS Symptoms of this condition include:  Pain that runs from the neck to the arm and hand. The pain can be severe or irritating. It may be worse when the neck is moved.  Numbness or weakness in the affected arm and hand. DIAGNOSIS This condition may be diagnosed based on symptoms, medical history, and a physical exam. You may also have tests, including:  X-rays.  CT scan.  MRI.  Electromyogram (EMG).  Nerve conduction tests. TREATMENT In many cases, treatment is not needed for this condition. With rest, the condition usually gets better over time. If treatment is needed, options may include:  Wearing a soft neck collar for short periods of time.  Physical therapy to strengthen your neck muscles.  Medicines, such as NSAIDs, oral corticosteroids, or spinal injections.  Surgery. This may be needed if other treatments do not help. Various types of surgery may be done depending on the cause of your problems. HOME CARE INSTRUCTIONS Managing Pain  Take over-the-counter and prescription medicines only as told by your health care provider.  If directed, apply ice to the affected area.  Put ice in a plastic bag.  Place a towel between your skin and the  bag.  Leave the ice on for 20 minutes, 2-3 times per day.  If ice does not help, you can try using heat. Take a warm shower or warm bath, or use a heat pack as told by your health care provider.  Try a gentle neck and shoulder massage to help relieve symptoms. Activity  Rest as needed. Follow instructions from your health care provider about any restrictions on activities.  Do stretching and strengthening exercises as told by your health care provider or physical therapist. General Instructions  If you were given a soft collar, wear it as told by your health care provider.  Use a flat pillow when you sleep.  Keep all follow-up visits as told by your health care provider. This is important. SEEK MEDICAL CARE IF:  Your condition does not improve with treatment. SEEK IMMEDIATE MEDICAL CARE IF:  Your pain gets much worse and cannot be controlled with medicines.  You have weakness or numbness in your hand, arm, face, or leg.  You have a high fever.  You have a stiff, rigid neck.  You lose control of your bowels or your bladder (have incontinence).  You have trouble with walking, balance, or speaking.   This information is not intended to replace advice given to you by your health care provider. Make sure you discuss any questions you have with your health care provider.   Document Released: 01/02/2001 Document Revised: 12/29/2014 Document Reviewed: 06/03/2014 Elsevier Interactive Patient Education 2016 [employer]. Shortness of Breath Shortness of breath means you have trouble breathing. It could also  mean that you have a medical problem. You should get immediate medical care for shortness of breath. CAUSES   Not enough oxygen in the air such as with high altitudes or a smoke-filled room.  Certain lung diseases, infections, or problems.  Heart disease or conditions, such as angina or heart failure.  Low red blood cells (anemia).  Poor physical fitness, which can cause  shortness of breath when you exercise.  Chest or back injuries or stiffness.  Being overweight.  Smoking.  Anxiety, which can make you feel like you are not getting enough air. DIAGNOSIS  Serious medical problems can often be found during your physical exam. Tests may also be done to determine why you are having shortness of breath. Tests may include:  Chest X-rays.  Lung function tests.  Blood tests.  An electrocardiogram (ECG).  An ambulatory electrocardiogram. An ambulatory ECG records your heartbeat patterns over a 24-hour period.  Exercise testing.  A transthoracic echocardiogram (TTE). During echocardiography, sound waves are used to evaluate how blood flows through your heart.  A transesophageal echocardiogram (TEE).  Imaging scans. Your health care provider may not be able to find a cause for your shortness of breath after your exam. In this case, it is important to have a follow-up exam with your health care provider as directed.  TREATMENT  Treatment for shortness of breath depends on the cause of your symptoms and can vary greatly. HOME CARE INSTRUCTIONS   Do not smoke. Smoking is a common cause of shortness of breath. If you smoke, ask for help to quit.  Avoid being around chemicals or things that may bother your breathing, such as paint fumes and dust.  Rest as needed. Slowly resume your usual activities.  If medicines were prescribed, take them as directed for the full length of time directed. This includes oxygen and any inhaled medicines.  Keep all follow-up appointments as directed by your health care provider. SEEK MEDICAL CARE IF:   Your condition does not improve in the time expected.  You have a hard time doing your normal activities even with rest.  You have any new symptoms. SEEK IMMEDIATE MEDICAL CARE IF:   Your shortness of breath gets worse.  You feel light-headed, faint, or develop a cough not controlled with medicines.  You start  coughing up blood.  You have pain with breathing.  You have chest pain or pain in your arms, shoulders, or abdomen.  You have a fever.  You are unable to walk up stairs or exercise the way you normally do. MAKE SURE YOU:  Understand these instructions.  Will watch your condition.  Will get help right away if you are not doing well or get worse.   This information is not intended to replace advice given to you by your health care provider. Make sure you discuss any questions you have with your health care provider.   Document Released: 01/02/2001 Document Revised: 04/14/2013 Document Reviewed: 06/25/2011 Elsevier Interactive Patient Education [employer] Elsevier Inc.

## 2015-05-08 ENCOUNTER — Encounter (HOSPITAL_COMMUNITY): Payer: Self-pay | Admitting: Emergency Medicine

## 2015-05-08 DIAGNOSIS — K219 Gastro-esophageal reflux disease without esophagitis: Secondary | ICD-10-CM | POA: Insufficient documentation

## 2015-05-08 DIAGNOSIS — M5412 Radiculopathy, cervical region: Secondary | ICD-10-CM | POA: Insufficient documentation

## 2015-05-08 DIAGNOSIS — Z79899 Other long term (current) drug therapy: Secondary | ICD-10-CM | POA: Insufficient documentation

## 2015-05-08 DIAGNOSIS — Z87442 Personal history of urinary calculi: Secondary | ICD-10-CM | POA: Insufficient documentation

## 2015-05-08 DIAGNOSIS — Z7982 Long term (current) use of aspirin: Secondary | ICD-10-CM | POA: Insufficient documentation

## 2015-05-08 DIAGNOSIS — F419 Anxiety disorder, unspecified: Secondary | ICD-10-CM | POA: Insufficient documentation

## 2015-05-08 DIAGNOSIS — I10 Essential (primary) hypertension: Secondary | ICD-10-CM | POA: Insufficient documentation

## 2015-05-08 DIAGNOSIS — F329 Major depressive disorder, single episode, unspecified: Secondary | ICD-10-CM | POA: Insufficient documentation

## 2015-05-08 DIAGNOSIS — Z87891 Personal history of nicotine dependence: Secondary | ICD-10-CM | POA: Insufficient documentation

## 2015-05-08 NOTE — ED Notes (Signed)
C/o increased anxiety and pain/numbness to R side of head, neck, and RUE x 1 week.  States he was seen here last week for same pain/numbness and given a Rx for steroids. Didn't get Rx filled because he didn't think it would help.  Smoked marijuana tonight to relieve symptoms without any improvement.

## 2015-05-09 ENCOUNTER — Emergency Department (HOSPITAL_COMMUNITY)
Admission: EM | Admit: 2015-05-09 | Discharge: 2015-05-09 | Disposition: A | Discharge status: Home / Self Care | Payer: Self-pay | Attending: Emergency Medicine | Admitting: Emergency Medicine

## 2015-05-09 DIAGNOSIS — F410 Panic disorder [episodic paroxysmal anxiety] without agoraphobia: Secondary | ICD-10-CM

## 2015-05-09 DIAGNOSIS — M5412 Radiculopathy, cervical region: Secondary | ICD-10-CM

## 2015-05-09 MED ORDER — ALPRAZOLAM 0.25 MG PO TABS: 0.2500 mg | ORAL_TABLET | Freq: Two times a day (BID) | ORAL | Status: DC | PRN

## 2015-05-09 NOTE — Discharge Instructions (Signed)

## 2015-05-09 NOTE — ED Provider Notes (Signed)
CSN: [id number]     Arrival date & time 05/08/15  2240 History   First MD Initiated Contact with Patient 05/09/15 0252     Chief Complaint  Patient presents with  . Anxiety     (Consider location/radiation/quality/duration/timing/severity/associated sxs/prior Treatment) HPI Comments: 60 year old male with past medical history including hypertension, GERD, depression who presents with anxiety and right neck pain. The patient was evaluated on 1/11 for neck and arm pain and he was given a Medrol Dosepak. He did not get the prescription filled because he did not think it would help and he does not like taking medications. He reports pain and feeling a "knot" in his right posterior shoulder with pain and numbness radiating down his right arm for the past week. He denies any history of trauma. No fevers or recent illness. No recent changes in physical activity. He denies any right arm/hand weakness. Patient also endorses episodes of feeling anxious and short of breath when his neck pain worsens. He denies associated CP. He states that the episodes last approximately 30 minutes and improved when he walks around to get fresh air. He states he had 2 episodes today which scared him and prompted him to come in for evaluation. He denies any fevers, vomiting, visual changes, or recent illness.  Patient is a 60 y.o. male presenting with anxiety. The history is provided by the patient.  Anxiety    Past Medical History  Diagnosis Date  . Depression   . Generalized headaches   . GERD (gastroesophageal reflux disease)   . Allergy   . Hypertension   . Kidney stones   . Allergic rhinitis, cause unspecified 10/26/2010  . HTN (hypertension) 10/26/2010  . DDD (degenerative disc disease), cervical 10/26/2010  . DDD (degenerative disc disease), lumbar 10/26/2010   Past Surgical History  Procedure Laterality Date  . Mouth surgery  at 60 yo after horse accident   Family History  Problem Relation Age of Onset  .  Hyperlipidemia Other   . Heart disease Other   . Stroke Other   . Hypertension Other   . Diabetes Other   . Cancer Other     prostate cancer  . Diabetes Other   . Alcohol abuse Other   . Cancer Other     breast cancer  . Stroke Other    Social History  Substance Use Topics  . Smoking status: Former Smoker    Quit date: 03/20/2014  . Smokeless tobacco: None  . Alcohol Use: 0.0 oz/week    0 Standard drinks or equivalent per week     Comment: socially     Review of Systems 10 Systems reviewed and are negative for acute change except as noted in the HPI.    Allergies  Sulfa antibiotics  Home Medications   Prior to Admission medications   Medication Sig Start Date End Date Taking? Authorizing Provider  ALPRAZolam (XANAX) 0.25 MG tablet Take 1 tablet (0.25 mg total) by mouth 2 (two) times daily as needed for anxiety. 05/09/15   Ambrose Finland Shawndrea Rutkowski, MD  amLODipine (NORVASC) 10 MG tablet TAKE 1 TABLET (10 MG TOTAL) BY MOUTH DAILY. Patient not taking: Reported on 02/26/2015 12/15/14   Corwin Levins, MD  aspirin 81 MG EC tablet Take 1 tablet (81 mg total) by mouth daily. Swallow whole. 03/18/12   Corwin Levins, MD  b complex vitamins capsule Take 1 capsule by mouth daily after lunch.     Historical Provider, MD  L-ARGININE PO Take 1  capsule by mouth daily.    Historical Provider, MD  methylPREDNISolone (MEDROL DOSEPAK) 4 MG TBPK tablet Take as directed on packet 05/05/15   Shon Batonourtney F Horton, MD  Multiple Vitamin (MULTIVITAMIN WITH MINERALS) TABS tablet Take 1 tablet by mouth daily after lunch.    Historical Provider, MD  Omega-3 Fatty Acids (OMEGA 3 PO) Take 1 capsule by mouth daily after lunch.    Historical Provider, MD  omeprazole (PRILOSEC) 20 MG capsule Take 1 capsule (20 mg total) by mouth daily as needed (for heartburn). 11/18/14   Corwin LevinsJames W John, MD  Tetrahydrozoline HCl (VISINE OP) Place 1 drop into both eyes daily as needed (allergies).    Historical Provider, MD  thiamine 100  MG tablet Take 100 mg by mouth daily.    Historical Provider, MD   BP 122/78 mmHg  Pulse 68  Temp(Src) 98 F (36.7 C) (Oral)  Resp 18  SpO2 99% Physical Exam  Constitutional: He is oriented to person, place, and time. He appears well-developed and well-nourished. No distress.  HENT:  Head: Normocephalic and atraumatic.  Moist mucous membranes  Eyes: Conjunctivae are normal. Pupils are equal, round, and reactive to light.  Neck: Normal range of motion. Neck supple.  Cardiovascular: Normal rate, regular rhythm and normal heart sounds.   No murmur heard. Pulmonary/Chest: Effort normal and breath sounds normal.  Abdominal: Soft. Bowel sounds are normal. He exhibits no distension. There is no tenderness.  Musculoskeletal: He exhibits no edema.  Point tenderness of R trapezius   Neurological: He is alert and oriented to person, place, and time.  Fluent speech, 5/5 strength and normal sensation x all 4 ext  Skin: Skin is warm and dry.  Psychiatric: He has a normal mood and affect. Judgment normal.  Mildly anxious but pleasant  Nursing note and vitals reviewed.   ED Course  Procedures (including critical care time) Labs Review Labs Reviewed - No data to display  Imaging Review No results found.   EKG Interpretation None      MDM   Final diagnoses:  Cervical radiculopathy  Anxiety attack   Pt w/ 1 week of pain in posterior R shoulder/neck w/ numbness radiating down R arm as well as episodes of feeling anxious, short of breath when his neck pain worsens. PT well appearing w/ reassuring VS at presentation. Neuro exam normal including normal RUE strength and sensation. Normal ROM R neck. Area of tenderness of R trapezius w/ no other abnormalities. I suspect  Cervical radiculopathy and I have reviewed supportive care instructions including NSAID course, heat therapy, range of motion exercises, and PCP follow-up for MRI if symptoms do not improve. Regarding the patient's anxiety and  shortness of breath episodes, his description is consistent with panic attacks. I have provided small course of Xanax to use only during panic episodes and have instructed to follow closely with PCP regarding any further treatment. All questions answered. Patient voiced understanding of plan as well as return precautions including upper extremity weakness. Patient discharged in satisfactory condition.  Laurence Spatesachel Morgan Jamare Vanatta, MD 05/09/15 [phone]

## 2015-05-09 NOTE — ED Notes (Signed)
Patient is alert and orientedx4.  Patient was explained discharge instructions and they understood them with no questions.  The  Patient's sister, Mason Morales is taking the patient home.

## 2015-05-16 ENCOUNTER — Telehealth: Payer: Self-pay | Admitting: Internal Medicine

## 2015-05-16 NOTE — Telephone Encounter (Signed)
[facility] - Client TELEPHONE ADVICE RECORD [facility]  Patient Name: Mason Morales  DOB: Feb 21, 1956    Initial Comment Caller states he was having a panic attack,he went to the ER last Wed, and Sunday. steroid, and Zanax. He has no more.steroids. Still having panic attacks. Doesn't seen the doctor until Friday.   Nurse Assessment  Nurse: Dorthula Rue, RN, Enrique Sack Date/Time (Eastern Time): 05/16/2015 2:13:21 PM  Confirm and document reason for call. If symptomatic, describe symptoms. You must click the next button to save text entered. ---Caller states he is having panic attacks. He states he has a pinched nerve in his neck that is causing it. He was taking steroids for that and Xanax for anxiety. He was anxious last night and did not sleep.  Has the patient traveled out of the country within the last 30 days? ---Not Applicable  Does the patient have any new or worsening symptoms? ---Yes  Will a triage be completed? ---Yes  Related visit to physician within the last 2 weeks? ---No  Does the PT have any chronic conditions? (i.e. diabetes, asthma, etc.) ---Yes  List chronic conditions. ---Anxiety, HTN  Is this a behavioral health or substance abuse call? ---No     Guidelines    Guideline Title Affirmed Question Affirmed Notes  Anxiety and Panic Attack Symptoms interfere with sleep or daily activities    Final Disposition User   See PCP When Office is Open (within 3 days) Dorthula Rue, RN, [employer] has not been seen at office yet. Has first visit on Friday. I cannot schedule him for that reason. He will follow up at ER or Urgent Care within the next few days.   Disagree/Comply: Comply

## 2015-05-16 NOTE — Telephone Encounter (Signed)
Patient states he has a Hospital Fu for this Friday the 27th.  He states he is still having anxiety attacks due to pain.  States that the hospital gave him alprazolam and a pain patch.  He can not come in until Friday.  He would like to know what he can do or if Dr. Jonny Ruiz can call him something in to get him through until Friday.

## 2015-05-17 ENCOUNTER — Telehealth: Payer: Self-pay | Admitting: Internal Medicine

## 2015-05-17 MED ORDER — HYDROXYZINE HCL 25 MG PO TABS
25.0000 mg | ORAL_TABLET | Freq: Three times a day (TID) | ORAL | Status: DC | PRN
Start: 2015-05-17 — End: 2016-03-13

## 2015-05-17 NOTE — Telephone Encounter (Signed)
Please advise, patient is still having anxiety attacks.

## 2015-05-17 NOTE — Telephone Encounter (Signed)
Ok for hydroxyzine prn - done erx

## 2015-05-17 NOTE — Telephone Encounter (Signed)
Patient called back in wanting to know if he could be worked in sooner.  He states that he is in pain and having lots of anxiety.  States last night was so bad that he has had suicidal thoughts.  I have advised him to go straight to the ER to be seen and have left his appointment on Friday still standing.

## 2015-05-18 NOTE — Telephone Encounter (Signed)
I agree, thanks!

## 2015-05-20 ENCOUNTER — Encounter: Payer: Self-pay | Admitting: Internal Medicine

## 2015-05-20 ENCOUNTER — Ambulatory Visit (INDEPENDENT_AMBULATORY_CARE_PROVIDER_SITE_OTHER): Payer: Self-pay | Admitting: Internal Medicine

## 2015-05-20 VITALS — BP 140/90 | HR 70 | Temp 96.9°F | Resp 20 | Ht 69.0 in | Wt 179.0 lb

## 2015-05-20 DIAGNOSIS — F411 Generalized anxiety disorder: Secondary | ICD-10-CM

## 2015-05-20 DIAGNOSIS — M5412 Radiculopathy, cervical region: Secondary | ICD-10-CM | POA: Insufficient documentation

## 2015-05-20 DIAGNOSIS — I10 Essential (primary) hypertension: Secondary | ICD-10-CM

## 2015-05-20 DIAGNOSIS — F419 Anxiety disorder, unspecified: Secondary | ICD-10-CM | POA: Insufficient documentation

## 2015-05-20 DIAGNOSIS — F32A Depression, unspecified: Secondary | ICD-10-CM | POA: Insufficient documentation

## 2015-05-20 MED ORDER — GABAPENTIN 100 MG PO CAPS: 100.0000 mg | ORAL_CAPSULE | Freq: Three times a day (TID) | ORAL | Status: DC

## 2015-05-20 MED ORDER — CYCLOBENZAPRINE HCL 5 MG PO TABS: 5.0000 mg | ORAL_TABLET | Freq: Three times a day (TID) | ORAL | Status: DC | PRN

## 2015-05-20 MED ORDER — ALPRAZOLAM 0.5 MG PO TABS: 0.5000 mg | ORAL_TABLET | Freq: Two times a day (BID) | ORAL | Status: DC | PRN

## 2015-05-20 MED ORDER — HYDROCODONE-ACETAMINOPHEN 7.5-325 MG PO TABS: 1.0000 | ORAL_TABLET | Freq: Four times a day (QID) | ORAL | Status: DC | PRN

## 2015-05-20 MED ORDER — NAPROXEN 500 MG PO TABS: 500.0000 mg | ORAL_TABLET | Freq: Two times a day (BID) | ORAL | Status: DC

## 2015-05-20 NOTE — Patient Instructions (Signed)
Please take all new medication as prescribed - the anti-inflammatory (naproxyn), the hydrocodone pain medication if needed, muscle relaxer (flexeril) as well as gabapentin for nerve pain  Please take all new medication as prescribed - the higher dose xanax for panic attacks  Please continue all other medications as before, and refills have been done if requested.  Please have the pharmacy call with any other refills you may need.  You will be contacted regarding the referral for: MRI for the neck, as well as orthopedic  Please keep your appointments with your specialists as you may have planned

## 2015-05-20 NOTE — Progress Notes (Signed)
Subjective:    Patient ID: Mason Morales, male    DOB: 12-Apr-1956, 60 y.o.   MRN: [id number]  HPI  Here to f/u; seen in ED jan 16 with 1 week of pain in posterior R shoulder/neck w/ numbness radiating down R arm as well as episodes of feeling anxious, short of breath when his neck pain worsens. Area of tenderness of R trapezius w/ no other abnormalities.Pt denies bowel or bladder change, fever, wt loss,  worsening LE pain/numbness/weakness, gait change or falls. C/w Cervical radiculopathy and tx including NSAID course, heat therapy, range of motion exercises  Not much improved, and ran out of xanax recently. Also has steriod pack.but pain still 10/10 and leading to worsening panic attacks nearly daily. Pt denies new neurological symptoms such as new headache, or facial or extremity weakness or numbness except for the above. Pt denies chest pain, increased sob or doe, wheezing, orthopnea, PND, increased LE swelling, palpitations, dizziness or syncope.  Has no health insurance at this time Past Medical History  Diagnosis Date  . Depression   . Generalized headaches   . GERD (gastroesophageal reflux disease)   . Allergy   . Hypertension   . Kidney stones   . Allergic rhinitis, cause unspecified 10/26/2010  . HTN (hypertension) 10/26/2010  . DDD (degenerative disc disease), cervical 10/26/2010  . DDD (degenerative disc disease), lumbar 10/26/2010   Past Surgical History  Procedure Laterality Date  . Mouth surgery  at 60 yo after horse accident    reports that he quit smoking about 14 months ago. He does not have any smokeless tobacco history on file. He reports that he drinks alcohol. He reports that he uses illicit drugs (Marijuana). family history includes Alcohol abuse in his other; Cancer in his other and other; Diabetes in his other and other; Heart disease in his other; Hyperlipidemia in his other; Hypertension in his other; Stroke in his other and other. Allergies  Allergen Reactions  . Sulfa  Antibiotics Nausea And Vomiting   Current Outpatient Prescriptions on File Prior to Visit  Medication Sig Dispense Refill  . amLODipine (NORVASC) 10 MG tablet TAKE 1 TABLET (10 MG TOTAL) BY MOUTH DAILY. 30 tablet 5  . aspirin 81 MG EC tablet Take 1 tablet (81 mg total) by mouth daily. Swallow whole. 30 tablet 12  . b complex vitamins capsule Take 1 capsule by mouth daily after lunch.     . hydrOXYzine (ATARAX/VISTARIL) 25 MG tablet Take 1 tablet (25 mg total) by mouth 3 (three) times daily as needed. 60 tablet 0  . L-ARGININE PO Take 1 capsule by mouth daily.    . Multiple Vitamin (MULTIVITAMIN WITH MINERALS) TABS tablet Take 1 tablet by mouth daily after lunch.    . Omega-3 Fatty Acids (OMEGA 3 PO) Take 1 capsule by mouth daily after lunch.    Marland Kitchen omeprazole (PRILOSEC) 20 MG capsule Take 1 capsule (20 mg total) by mouth daily as needed (for heartburn). 90 capsule 3  . Tetrahydrozoline HCl (VISINE OP) Place 1 drop into both eyes daily as needed (allergies).    . thiamine 100 MG tablet Take 100 mg by mouth daily.    . [DISCONTINUED] pantoprazole (PROTONIX) 20 MG tablet Take 1 tablet (20 mg total) by mouth daily. (Patient not taking: Reported on 05/05/2015) 30 tablet 0   No current facility-administered medications on file prior to visit.     Review of Systems  Constitutional: Negative for unusual diaphoresis or night sweats HENT: Negative for  ringing in ear or discharge Eyes: Negative for double vision or worsening visual disturbance.  Respiratory: Negative for choking and stridor.   Gastrointestinal: Negative for vomiting or other signifcant bowel change Genitourinary: Negative for hematuria or change in urine volume.  Musculoskeletal: Negative for other MSK pain or swelling Skin: Negative for color change and worsening wound.  Neurological: Negative for tremors and numbness other than noted  Psychiatric/Behavioral: Negative for decreased concentration or agitation other than above         Objective:   Physical Exam BP 140/90 mmHg  Pulse 70  Temp(Src) 96.9 F (36.1 C) (Oral)  Resp 20  Ht  (1.753 m)  Wt 179 lb (81.194 kg)  BMI 26.42 kg/m2  SpO2 99% VS noted,  Constitutional: Pt appears in no significant distress HENT: Head: NCAT.  Right Ear: External ear normal.  Left Ear: External ear normal.  Eyes: . Pupils are equal, round, and reactive to light. Conjunctivae and EOM are normal Neck: Normal range of motion. Neck supple. FROM, mod tender to midline lowest cervical, as well as tender to parathoracic right > left tender/spasm, no swelling or rash Cardiovascular: Normal rate and regular rhythm.   Pulmonary/Chest: Effort normal and breath sounds without rales or wheezing.  Neurological: Pt is alert. Not confused , motor intact 5/5, sens/dtr intact Skin: Skin is warm. No rash, no LE edema Psychiatric: Pt behavior is normal. No agitation. 2+ nervous    Assessment & Plan:

## 2015-05-20 NOTE — Assessment & Plan Note (Signed)
stable overall by history and exam, recent data reviewed with pt, and pt to continue medical treatment as before,  to f/u any worsening symptoms or concerns BP Readings from Last 3 Encounters:  05/20/15 140/90  05/09/15 122/78  05/05/15 145/90

## 2015-05-20 NOTE — Progress Notes (Signed)
Pre visit review using our clinic review tool, if applicable. No additional management support is needed unless otherwise documented below in the visit note. 

## 2015-05-20 NOTE — Assessment & Plan Note (Signed)
With occas panic, for xanax prn,  to f/u any worsening symptoms or concerns

## 2015-05-20 NOTE — Assessment & Plan Note (Addendum)
Persistent pain and numbness despite recent tx with nsaid and prednisone; for pain control with hydrocodone, gabapentin and muscle relaxer trial, for MRI, and ortho referral

## 2015-05-27 ENCOUNTER — Ambulatory Visit
Admission: RE | Admit: 2015-05-27 | Discharge: 2015-05-27 | Disposition: A | Discharge status: Home / Self Care | Payer: Self-pay | Source: Ambulatory Visit | Attending: Internal Medicine | Admitting: Internal Medicine

## 2015-05-27 DIAGNOSIS — M5412 Radiculopathy, cervical region: Secondary | ICD-10-CM

## 2015-05-31 ENCOUNTER — Telehealth: Payer: Self-pay | Admitting: Internal Medicine

## 2015-05-31 NOTE — Telephone Encounter (Signed)
Pt request result of the MRI, please call him back

## 2015-06-02 ENCOUNTER — Telehealth: Payer: Self-pay | Admitting: Internal Medicine

## 2015-06-02 NOTE — Telephone Encounter (Signed)
Patient is going to give that office one more day.  Is requesting Dr. Jonny Ruiz to hold off one more day.

## 2015-06-02 NOTE — Telephone Encounter (Signed)
States Dr. Jonny Ruiz referred him to [facility].  Patient states they are taking too long to get him in and is requesting to referred to another office.

## 2015-07-12 ENCOUNTER — Other Ambulatory Visit: Payer: Self-pay | Admitting: Internal Medicine

## 2015-07-12 NOTE — Telephone Encounter (Signed)
Done hardcopy to Corinne  

## 2015-07-12 NOTE — Telephone Encounter (Signed)
Please advise, ok to refill? 

## 2015-07-12 NOTE — Telephone Encounter (Signed)
Medication sent to pharmacy  

## 2015-08-26 ENCOUNTER — Other Ambulatory Visit: Payer: Self-pay | Admitting: Occupational Medicine

## 2015-08-26 ENCOUNTER — Ambulatory Visit: Payer: Self-pay

## 2015-08-26 DIAGNOSIS — Z0000 Encounter for general adult medical examination without abnormal findings: Secondary | ICD-10-CM

## 2015-09-06 ENCOUNTER — Encounter: Payer: Self-pay | Admitting: Internal Medicine

## 2015-09-06 ENCOUNTER — Other Ambulatory Visit (INDEPENDENT_AMBULATORY_CARE_PROVIDER_SITE_OTHER): Payer: Self-pay

## 2015-09-06 ENCOUNTER — Ambulatory Visit (INDEPENDENT_AMBULATORY_CARE_PROVIDER_SITE_OTHER): Payer: Self-pay | Admitting: Internal Medicine

## 2015-09-06 VITALS — BP 138/82 | HR 71 | Temp 98.2°F | Resp 20 | Wt 172.0 lb

## 2015-09-06 DIAGNOSIS — I10 Essential (primary) hypertension: Secondary | ICD-10-CM

## 2015-09-06 DIAGNOSIS — F411 Generalized anxiety disorder: Secondary | ICD-10-CM

## 2015-09-06 DIAGNOSIS — M5412 Radiculopathy, cervical region: Secondary | ICD-10-CM

## 2015-09-06 LAB — URINALYSIS, ROUTINE W REFLEX MICROSCOPIC
BILIRUBIN URINE: NEGATIVE
HGB URINE DIPSTICK: NEGATIVE
Ketones, ur: NEGATIVE
LEUKOCYTES UA: NEGATIVE
Nitrite: NEGATIVE
PH: 6 (ref 5.0–8.0)
UROBILINOGEN UA: 0.2 (ref 0.0–1.0)
Urine Glucose: NEGATIVE

## 2015-09-06 LAB — BASIC METABOLIC PANEL
BUN: 18 mg/dL (ref 6–23)
CALCIUM: 9.7 mg/dL (ref 8.4–10.5)
CO2: 28 mEq/L (ref 19–32)
Chloride: 102 mEq/L (ref 96–112)
Creatinine, Ser: 1.07 mg/dL (ref 0.40–1.50)
GFR: 91.33 mL/min (ref >60.00)
Glucose, Bld: 93 mg/dL (ref 70–99)
Potassium: 4 mEq/L (ref 3.5–5.1)
SODIUM: 137 meq/L (ref 135–145)

## 2015-09-06 MED ORDER — CITALOPRAM HYDROBROMIDE 20 MG PO TABS: 20.0000 mg | ORAL_TABLET | Freq: Every day | ORAL | Status: DC

## 2015-09-06 MED ORDER — AMLODIPINE BESYLATE 10 MG PO TABS: ORAL_TABLET | ORAL | Status: DC

## 2015-09-06 MED ORDER — GABAPENTIN 100 MG PO CAPS: 200.0000 mg | ORAL_CAPSULE | Freq: Three times a day (TID) | ORAL | Status: DC

## 2015-09-06 NOTE — Progress Notes (Signed)
Pre visit review using our clinic review tool, if applicable. No additional management support is needed unless otherwise documented below in the visit note. 

## 2015-09-06 NOTE — Assessment & Plan Note (Signed)
stable overall by history and exam, recent data reviewed with pt, and pt to continue medical treatment as before,  to f/u any worsening symptoms or concerns, pt has been told i suspect his GFR was low at 25, so will need BMP and UA, consider urology referral

## 2015-09-06 NOTE — Assessment & Plan Note (Signed)
Ok for further pain control with gabapentin at 200 mg tid prn,  to f/u any worsening symptoms or concerns

## 2015-09-06 NOTE — Assessment & Plan Note (Signed)
Ok for celexa 20 mg as may be less expensive than the zoloft he asks about, cont xanax prn panic only

## 2015-09-06 NOTE — Progress Notes (Signed)
Subjective:    Patient ID: PURCELL JUNGBLUTH, male    DOB: 02/20/1956, 60 y.o.   MRN: [id number]  HPI  Here to f/u, after pre-employment physical showed "creatine level was 25 instead of over 60 and needs a urine test"  Pt denies chest pain, increased sob or doe, wheezing, orthopnea, PND, increased LE swelling, palpitations, dizziness or syncope.  Pt denies new neurological symptoms such as new headache, or facial or extremity weakness or numbness, though has persistent neck pain, did not follow through on recommended ESI, trying to get by with meds, takes 2 per day instead of 1 but gets better control with 200 mg gabapentin.  Denies worsening depressive symptoms, suicidal ideation, or panic; has ongoing anxiety, still has had several panic attacks assoc with pain flares.  Past Medical History  Diagnosis Date  . Depression   . Generalized headaches   . GERD (gastroesophageal reflux disease)   . Allergy   . Hypertension   . Kidney stones   . Allergic rhinitis, cause unspecified 10/26/2010  . HTN (hypertension) 10/26/2010  . DDD (degenerative disc disease), cervical 10/26/2010  . DDD (degenerative disc disease), lumbar 10/26/2010   Past Surgical History  Procedure Laterality Date  . Mouth surgery  at 60 yo after horse accident    reports that he quit smoking about 17 months ago. He does not have any smokeless tobacco history on file. He reports that he drinks alcohol. He reports that he uses illicit drugs (Marijuana). family history includes Alcohol abuse in his other; Cancer in his other and other; Diabetes in his other and other; Heart disease in his other; Hyperlipidemia in his other; Hypertension in his other; Stroke in his other and other. Allergies  Allergen Reactions  . Sulfa Antibiotics Nausea And Vomiting   Current Outpatient Prescriptions on File Prior to Visit  Medication Sig Dispense Refill  . ALPRAZolam (XANAX) 0.5 MG tablet TAKE 1 TABLET BY MOUTH TWICE DAILY AS NEEDED FOR ANXIETY 60  tablet 1  . amLODipine (NORVASC) 10 MG tablet TAKE 1 TABLET (10 MG TOTAL) BY MOUTH DAILY. 30 tablet 5  . aspirin 81 MG EC tablet Take 1 tablet (81 mg total) by mouth daily. Swallow whole. 30 tablet 12  . b complex vitamins capsule Take 1 capsule by mouth daily after lunch.     . cyclobenzaprine (FLEXERIL) 5 MG tablet Take 1 tablet (5 mg total) by mouth 3 (three) times daily as needed for muscle spasms. 40 tablet 1  . gabapentin (NEURONTIN) 100 MG capsule Take 1 capsule (100 mg total) by mouth 3 (three) times daily. 90 capsule 3  . HYDROcodone-acetaminophen (NORCO) 7.5-325 MG tablet Take 1 tablet by mouth every 6 (six) hours as needed for moderate pain. 50 tablet 0  . hydrOXYzine (ATARAX/VISTARIL) 25 MG tablet Take 1 tablet (25 mg total) by mouth 3 (three) times daily as needed. 60 tablet 0  . L-ARGININE PO Take 1 capsule by mouth daily.    . Multiple Vitamin (MULTIVITAMIN WITH MINERALS) TABS tablet Take 1 tablet by mouth daily after lunch.    . naproxen (NAPROSYN) 500 MG tablet Take 1 tablet (500 mg total) by mouth 2 (two) times daily with a meal. 60 tablet 2  . Omega-3 Fatty Acids (OMEGA 3 PO) Take 1 capsule by mouth daily after lunch.    Marland Kitchen omeprazole (PRILOSEC) 20 MG capsule Take 1 capsule (20 mg total) by mouth daily as needed (for heartburn). 90 capsule 3  . Tetrahydrozoline HCl (VISINE OP)  Place 1 drop into both eyes daily as needed (allergies).    . thiamine 100 MG tablet Take 100 mg by mouth daily.    . [DISCONTINUED] pantoprazole (PROTONIX) 20 MG tablet Take 1 tablet (20 mg total) by mouth daily. (Patient not taking: Reported on 05/05/2015) 30 tablet 0   No current facility-administered medications on file prior to visit.   Review of Systems  Constitutional: Negative for unusual diaphoresis or night sweats HENT: Negative for ear swelling or discharge Eyes: Negative for worsening visual haziness  Respiratory: Negative for choking and stridor.   Gastrointestinal: Negative for distension  or worsening eructation Genitourinary: Negative for retention or change in urine volume.  Musculoskeletal: Negative for other MSK pain or swelling Skin: Negative for color change and worsening wound Neurological: Negative for tremors and numbness other than noted  Psychiatric/Behavioral: Negative for decreased concentration or agitation other than above       Objective:   Physical Exam BP 138/82 mmHg  Pulse 71  Temp(Src) 98.2 F (36.8 C) (Oral)  Resp 20  Wt 172 lb (78.019 kg)  SpO2 96% VS noted,  Constitutional: Pt appears in no apparent distress HENT: Head: NCAT.  Right Ear: External ear normal.  Left Ear: External ear normal.  Eyes: . Pupils are equal, round, and reactive to light. Conjunctivae and EOM are normal Neck: Normal range of motion. Neck supple.  Cardiovascular: Normal rate and regular rhythm.   Pulmonary/Chest: Effort normal and breath sounds without rales or wheezing.  Abd:  Soft, NT, ND, + BS Neurological: Pt is alert. Not confused , motor grossly intact Skin: Skin is warm. No rash, no LE edema Psychiatric: Pt behavior is normal. No agitation. mild nervous       Assessment & Plan:

## 2015-09-06 NOTE — Patient Instructions (Signed)
Ok to increase the gabapentin to 200 mg three times per day (sent to your pharmacy)  Please take all new medication as prescribed - the celexa 20 mg per day (given in hardcopy)  Please continue all other medications as before, except I would hold on all OTC supplements until the kidney function is checked  Please have the pharmacy call with any other refills you may need.  Please keep your appointments with your specialists as you may have planned  Please go to the LAB in the Basement (turn left off the elevator) for the tests to be done today  You will be contacted by phone if any changes need to be made immediately.  Otherwise, you will receive a letter about your results with an explanation, but please check with MyChart first.  Please remember to sign up for MyChart if you have not done so, as this will be important to you in the future with finding out test results, communicating by private email, and scheduling acute appointments online when needed.  Please return in 6 months, or sooner if needed

## 2015-10-04 ENCOUNTER — Telehealth: Payer: Self-pay | Admitting: Internal Medicine

## 2015-10-04 MED ORDER — ALPRAZOLAM 0.5 MG PO TABS: 0.5000 mg | ORAL_TABLET | Freq: Two times a day (BID) | ORAL | Status: DC | PRN

## 2015-10-04 NOTE — Telephone Encounter (Signed)
Done hardcopy to Corinne  

## 2015-10-04 NOTE — Telephone Encounter (Signed)
Medication refill sent to pharmacy  

## 2015-10-04 NOTE — Telephone Encounter (Signed)
Relation to ZO:XWRUpt:self Call back number:[phone] Pharmacy:  Reason for call:  Patient states PCP prescribed a new medication (patient didn't know the name of new medication) and would like to know how he should wean off of ALPRAZolam (XANAX) 0.5 MG please advise

## 2015-11-18 ENCOUNTER — Encounter: Payer: Self-pay | Admitting: Internal Medicine

## 2015-12-30 ENCOUNTER — Other Ambulatory Visit (INDEPENDENT_AMBULATORY_CARE_PROVIDER_SITE_OTHER): Payer: 59

## 2015-12-30 ENCOUNTER — Ambulatory Visit (INDEPENDENT_AMBULATORY_CARE_PROVIDER_SITE_OTHER): Payer: 59 | Admitting: Internal Medicine

## 2015-12-30 ENCOUNTER — Encounter: Payer: Self-pay | Admitting: Internal Medicine

## 2015-12-30 VITALS — BP 132/84 | HR 79 | Temp 97.8°F | Resp 14 | Ht 69.0 in | Wt 178.8 lb

## 2015-12-30 DIAGNOSIS — Z0001 Encounter for general adult medical examination with abnormal findings: Secondary | ICD-10-CM | POA: Diagnosis not present

## 2015-12-30 DIAGNOSIS — F411 Generalized anxiety disorder: Secondary | ICD-10-CM | POA: Diagnosis not present

## 2015-12-30 DIAGNOSIS — R7989 Other specified abnormal findings of blood chemistry: Secondary | ICD-10-CM

## 2015-12-30 DIAGNOSIS — Z1211 Encounter for screening for malignant neoplasm of colon: Secondary | ICD-10-CM

## 2015-12-30 DIAGNOSIS — I10 Essential (primary) hypertension: Secondary | ICD-10-CM

## 2015-12-30 DIAGNOSIS — K219 Gastro-esophageal reflux disease without esophagitis: Secondary | ICD-10-CM

## 2015-12-30 DIAGNOSIS — R6889 Other general symptoms and signs: Secondary | ICD-10-CM | POA: Diagnosis not present

## 2015-12-30 DIAGNOSIS — M546 Pain in thoracic spine: Secondary | ICD-10-CM | POA: Diagnosis not present

## 2015-12-30 DIAGNOSIS — Z1159 Encounter for screening for other viral diseases: Secondary | ICD-10-CM

## 2015-12-30 LAB — BASIC METABOLIC PANEL
BUN: 15 mg/dL (ref 6–23)
CALCIUM: 9.1 mg/dL (ref 8.4–10.5)
CO2: 30 meq/L (ref 19–32)
Chloride: 103 mEq/L (ref 96–112)
Creatinine, Ser: 1.14 mg/dL (ref 0.40–1.50)
GFR: 84.8 mL/min (ref >60.00)
GLUCOSE: 83 mg/dL (ref 70–99)
POTASSIUM: 4 meq/L (ref 3.5–5.1)
Sodium: 139 mEq/L (ref 135–145)

## 2015-12-30 LAB — HEPATIC FUNCTION PANEL
ALBUMIN: 4.3 g/dL (ref 3.5–5.2)
ALK PHOS: 60 U/L (ref 39–117)
ALT: 12 U/L (ref 0–53)
AST: 20 U/L (ref 0–37)
BILIRUBIN DIRECT: 0.1 mg/dL (ref 0.0–0.3)
TOTAL PROTEIN: 7.4 g/dL (ref 6.0–8.3)
Total Bilirubin: 0.4 mg/dL (ref 0.2–1.2)

## 2015-12-30 LAB — URINALYSIS, ROUTINE W REFLEX MICROSCOPIC
Bilirubin Urine: NEGATIVE
KETONES UR: NEGATIVE
Leukocytes, UA: NEGATIVE
Nitrite: NEGATIVE
PH: 7 (ref 5.0–8.0)
SPECIFIC GRAVITY, URINE: 1.015 (ref 1.000–1.030)
Total Protein, Urine: NEGATIVE
UROBILINOGEN UA: 0.2 (ref 0.0–1.0)
Urine Glucose: NEGATIVE

## 2015-12-30 LAB — CBC WITH DIFFERENTIAL/PLATELET
BASOS ABS: 0 10*3/uL (ref 0.0–0.1)
Basophils Relative: 0.6 % (ref 0.0–3.0)
EOS ABS: 0.2 10*3/uL (ref 0.0–0.7)
Eosinophils Relative: 2.8 % (ref 0.0–5.0)
HCT: 41.5 % (ref 39.0–52.0)
Hemoglobin: 14.3 g/dL (ref 13.0–17.0)
LYMPHS ABS: 2.7 10*3/uL (ref 0.7–4.0)
Lymphocytes Relative: 45.3 % (ref 12.0–46.0)
MCHC: 34.5 g/dL (ref 30.0–36.0)
MCV: 86.1 fl (ref 78.0–100.0)
MONO ABS: 0.4 10*3/uL (ref 0.1–1.0)
MONOS PCT: 6.3 % (ref 3.0–12.0)
NEUTROS ABS: 2.7 10*3/uL (ref 1.4–7.7)
NEUTROS PCT: 45 % (ref 43.0–77.0)
PLATELETS: 218 10*3/uL (ref 150.0–400.0)
RBC: 4.82 Mil/uL (ref 4.22–5.81)
RDW: 14.2 % (ref 11.5–15.5)
WBC: 6.1 10*3/uL (ref 4.0–10.5)

## 2015-12-30 LAB — LIPID PANEL
CHOL/HDL RATIO: 4
CHOLESTEROL: 164 mg/dL (ref 0–200)
HDL: 46.4 mg/dL (ref >39.00)
NonHDL: 117.68
TRIGLYCERIDES: 223 mg/dL — AB (ref 0.0–149.0)
VLDL: 44.6 mg/dL — AB (ref 0.0–40.0)

## 2015-12-30 LAB — LDL CHOLESTEROL, DIRECT: LDL DIRECT: 89 mg/dL

## 2015-12-30 LAB — TSH: TSH: 0.84 u[IU]/mL (ref 0.35–4.50)

## 2015-12-30 LAB — PSA: PSA: 0.66 ng/mL (ref 0.10–4.00)

## 2015-12-30 MED ORDER — CYCLOBENZAPRINE HCL 5 MG PO TABS: 5.0000 mg | ORAL_TABLET | Freq: Three times a day (TID) | ORAL | 1 refills | Status: DC | PRN

## 2015-12-30 NOTE — Progress Notes (Signed)
Subjective:    Patient ID: Mason Morales, male    DOB: 12/26/1955, 60 y.o.   MRN: [id number]  HPI  Here for wellness and f/u;  Overall doing ok;  Pt denies Chest pain, worsening SOB, DOE, wheezing, orthopnea, PND, worsening LE edema, palpitations, dizziness or syncope.  Pt denies neurological change such as new headache, facial or extremity weakness.  Pt denies polydipsia, polyuria, or low sugar symptoms. Pt states overall good compliance with treatment and medications, good tolerability, and has been trying to follow appropriate diet. No fever, night sweats, wt loss, loss of appetite, or other constitutional symptoms.  Pt states good ability with ADL's, has low fall risk, home safety reviewed and adequate, no other significant changes in hearing or vision, and only occasionally active with exercise. Declines flu and tetanus shots. Ok for colonoscopy   Pt continues to have recurring 2 wks upper back pain, mild to mod, daily for months, no bowel or bladder change, fever, wt loss,  worsening LE pain/numbness/weakness, gait change or falls.  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 (has symptoms on the right in the past that have resolved)  Nothing else makes better or worse.  Denies worsening reflux, abd pain, dysphagia, n/v, bowel change or blood. Denies worsening depressive symptoms, suicidal ideation, or panic; has ongoing anxiety, stable recently Past Medical History:  Diagnosis Date  . Allergic rhinitis, cause unspecified 10/26/2010  . Allergy   . DDD (degenerative disc disease), cervical 10/26/2010  . DDD (degenerative disc disease), lumbar 10/26/2010  . Depression   . Generalized headaches   . GERD (gastroesophageal reflux disease)   . HTN (hypertension) 10/26/2010  . Hypertension   . Kidney stones    Past Surgical History:  Procedure Laterality Date  . MOUTH SURGERY  at 61 yo after horse accident    reports that he  quit smoking about 21 months ago. He does not have any smokeless tobacco history on file. He reports that he drinks alcohol. He reports that he uses drugs, including Marijuana. family history includes Alcohol abuse in his other; Cancer in his other and other; Diabetes in his other and other; Heart disease in his other; Hyperlipidemia in his other; Hypertension in his other; Stroke in his other and other. Allergies  Allergen Reactions  . Sulfa Antibiotics Nausea And Vomiting   Current Outpatient Prescriptions on File Prior to Visit  Medication Sig Dispense Refill  . ALPRAZolam (XANAX) 0.5 MG tablet Take 1 tablet (0.5 mg total) by mouth 2 (two) times daily as needed. for anxiety 60 tablet 1  . amLODipine (NORVASC) 10 MG tablet TAKE 1 TABLET (10 MG TOTAL) BY MOUTH DAILY. 90 tablet 3  . aspirin 81 MG EC tablet Take 1 tablet (81 mg total) by mouth daily. Swallow whole. 30 tablet 12  . b complex vitamins capsule Take 1 capsule by mouth daily after lunch.     . citalopram (CELEXA) 20 MG tablet Take 1 tablet (20 mg total) by mouth daily. 90 tablet 3  . gabapentin (NEURONTIN) 100 MG capsule Take 2 capsules (200 mg total) by mouth 3 (three) times daily. 180 capsule 3  . hydrOXYzine (ATARAX/VISTARIL) 25 MG tablet Take 1 tablet (25 mg total) by mouth 3 (three) times daily as needed. 60 tablet 0  . L-ARGININE PO Take 1 capsule by mouth daily.    . Multiple Vitamin (MULTIVITAMIN WITH MINERALS) TABS tablet Take 1  tablet by mouth daily after lunch.    . naproxen (NAPROSYN) 500 MG tablet Take 1 tablet (500 mg total) by mouth 2 (two) times daily with a meal. 60 tablet 2  . Omega-3 Fatty Acids (OMEGA 3 PO) Take 1 capsule by mouth daily after lunch.    Marland Kitchen. omeprazole (PRILOSEC) 20 MG capsule Take 1 capsule (20 mg total) by mouth daily as needed (for heartburn). 90 capsule 3  . Tetrahydrozoline HCl (VISINE OP) Place 1 drop into both eyes daily as needed (allergies).    . thiamine 100 MG tablet Take 100 mg by mouth  daily.    . [DISCONTINUED] pantoprazole (PROTONIX) 20 MG tablet Take 1 tablet (20 mg total) by mouth daily. (Patient not taking: Reported on 05/05/2015) 30 tablet 0   No current facility-administered medications on file prior to visit.    Review of Systems VS noted,  Constitutional: Pt is oriented to person, place, and time. Appears well-developed and well-nourished, in no significant distress Head: Normocephalic and atraumatic  Eyes: Conjunctivae and EOM are normal. Pupils are equal, round, and reactive to light Right Ear: External ear normal.  Left Ear: External ear normal Nose: Nose normal.  Mouth/Throat: Oropharynx is clear and moist  Neck: Normal range of motion. Neck supple. No JVD present. No tracheal deviation present or significant neck LA or mass Cardiovascular: Normal rate, regular rhythm, normal heart sounds and intact distal pulses.   Pulmonary/Chest: Effort normal and breath sounds without rales or wheezing  Abdominal: Soft. Bowel sounds are normal. NT. No HSM  Musculoskeletal: Normal range of motion. Exhibits no edema Lymphadenopathy: Has no cervical adenopathy.  Neurological: Pt is alert and oriented to person, place, and time. Pt has normal reflexes. No cranial nerve deficit. Motor grossly intact Skin: Skin is warm and dry. No rash noted or new ulcers Psychiatric:  Has normal mood and affect. Behavior is normal. ;ros     Objective:   Physical Exam BP 132/84 (BP Location: Left Arm, Patient Position: Sitting, Cuff Size: Normal)   Pulse 79   Temp 97.8 F (36.6 C) (Oral)   Resp 14   Ht 5\' 9"  (1.753 m)   Wt 178 lb 12.8 oz (81.1 kg)   SpO2 96%   BMI 26.40 kg/m  VS noted, not ill appearing Constitutional: Pt is oriented to person, place, and time. Appears well-developed and well-nourished, in no significant distress Head: Normocephalic and atraumatic  Eyes: Conjunctivae and EOM are normal. Pupils are equal, round, and reactive to light Right Ear: External ear normal.   Left Ear: External ear normal Nose: Nose normal.  Mouth/Throat: Oropharynx is clear and moist  Neck: Normal range of motion. Neck supple. No JVD present. No tracheal deviation present or significant neck LA or mass Cardiovascular: Normal rate, regular rhythm, normal heart sounds and intact distal pulses.   Pulmonary/Chest: Effort normal and breath sounds without rales or wheezing  Abdominal: Soft. Bowel sounds are normal. NT. No HSM  Musculoskeletal: Normal range of motion. Exhibits no edema, has bilat upper thoracic paravertebral tender/spasm without skin change or swelling Lymphadenopathy: Has no cervical adenopathy.  Neurological: Pt is alert and oriented to person, place, and time. Pt has normal reflexes. No cranial nerve deficit. Motor grossly intact Skin: Skin is warm and dry. No rash noted or new ulcers Psychiatric:  Has normal mood and affect. Behavior is normal.     Assessment & Plan:

## 2015-12-30 NOTE — Patient Instructions (Addendum)
OK to try the flexeril as you suggested  Please continue all other medications as before, and refills have been done if requested.  Please have the pharmacy call with any other refills you may need.  Please continue your efforts at being more active, low cholesterol diet, and weight control.  You are otherwise up to date with prevention measures today.  Please keep your appointments with your specialists as you may have planned  Please go to the LAB in the Basement (turn left off the elevator) for the tests to be done today  You will be contacted by phone if any changes need to be made immediately.  Otherwise, you will receive a letter about your results with an explanation, but please check with MyChart first.  Please remember to sign up for MyChart if you have not done so, as this will be important to you in the future with finding out test results, communicating by private email, and scheduling acute appointments online when needed.  Please return in 1 year for your yearly visit, or sooner if needed, with Lab testing done 3-5 days before

## 2015-12-31 LAB — HEPATITIS C ANTIBODY: HCV AB: NEGATIVE

## 2015-12-31 NOTE — Assessment & Plan Note (Signed)
Recent onset, c/w msk spasm, for flexeril prn,  to f/u any worsening symptoms or concerns

## 2015-12-31 NOTE — Assessment & Plan Note (Signed)
stable overall by history and exam, and pt to continue medical treatment as before,  to f/u any worsening symptoms or concerns 

## 2015-12-31 NOTE — Assessment & Plan Note (Signed)

## 2015-12-31 NOTE — Assessment & Plan Note (Signed)
stable overall by history and exam, recent data reviewed with pt, and pt to continue medical treatment as before,  to f/u any worsening symptoms or concerns Lab Results  Component Value Date   WBC 6.1 12/30/2015   HGB 14.3 12/30/2015   HCT 41.5 12/30/2015   PLT 218.0 12/30/2015   GLUCOSE 83 12/30/2015   CHOL 164 12/30/2015   TRIG 223.0 (H) 12/30/2015   HDL 46.40 12/30/2015   LDLDIRECT 89.0 12/30/2015   ALT 12 12/30/2015   AST 20 12/30/2015   NA 139 12/30/2015   K 4.0 12/30/2015   CL 103 12/30/2015   CREATININE 1.14 12/30/2015   BUN 15 12/30/2015   CO2 30 12/30/2015   TSH 0.84 12/30/2015   PSA 0.66 12/30/2015

## 2015-12-31 NOTE — Assessment & Plan Note (Signed)
stable overall by history and exam, recent data reviewed with pt, and pt to continue medical treatment as before,  to f/u any worsening symptoms or concerns BP Readings from Last 3 Encounters:  12/30/15 132/84  09/06/15 138/82  05/20/15 140/90

## 2016-01-12 ENCOUNTER — Encounter: Payer: Self-pay | Admitting: Gastroenterology

## 2016-03-13 ENCOUNTER — Ambulatory Visit (AMBULATORY_SURGERY_CENTER): Payer: Self-pay | Admitting: *Deleted

## 2016-03-13 VITALS — Ht 69.0 in | Wt 177.0 lb

## 2016-03-13 DIAGNOSIS — Z1211 Encounter for screening for malignant neoplasm of colon: Secondary | ICD-10-CM

## 2016-03-13 MED ORDER — NA SULFATE-K SULFATE-MG SULF 17.5-3.13-1.6 GM/177ML PO SOLN: ORAL | 0 refills | Status: DC

## 2016-03-13 NOTE — Progress Notes (Signed)
Patient denies any allergies to eggs or soy. Patient denies any problems with anesthesia/sedation. Patient denies any oxygen use at home and does not take any diet/weight loss medications. EMMI education assisgned to patient on colonoscopy, this was explained and instructions given to patient. 

## 2016-03-18 ENCOUNTER — Other Ambulatory Visit: Payer: Self-pay | Admitting: Internal Medicine

## 2016-03-27 ENCOUNTER — Encounter: Payer: 59 | Admitting: Gastroenterology

## 2016-04-02 ENCOUNTER — Encounter: Payer: Self-pay | Admitting: Gastroenterology

## 2016-04-02 ENCOUNTER — Ambulatory Visit (AMBULATORY_SURGERY_CENTER): Payer: 59 | Admitting: Gastroenterology

## 2016-04-02 VITALS — BP 134/86 | HR 66 | Temp 98.6°F | Resp 18 | Ht 69.0 in | Wt 177.0 lb

## 2016-04-02 DIAGNOSIS — Z1211 Encounter for screening for malignant neoplasm of colon: Secondary | ICD-10-CM | POA: Diagnosis present

## 2016-04-02 DIAGNOSIS — Z1212 Encounter for screening for malignant neoplasm of rectum: Secondary | ICD-10-CM

## 2016-04-02 MED ORDER — SODIUM CHLORIDE 0.9 % IV SOLN: 500.0000 mL | INTRAVENOUS | Status: DC

## 2016-04-02 NOTE — Progress Notes (Signed)
Report to PACU, RN, vss, BBS= Clear.  

## 2016-04-02 NOTE — Patient Instructions (Signed)
YOU HAD AN ENDOSCOPIC PROCEDURE TODAY AT THE [facility]:   Refer to the procedure report that was given to you for any specific questions about what was found during the examination.  If the procedure report does not answer your questions, please call your gastroenterologist to clarify.  If you requested that your care partner not be given the details of your procedure findings, then the procedure report has been included in a sealed envelope for you to review at your convenience later.  YOU SHOULD EXPECT: Some feelings of bloating in the abdomen. Passage of more gas than usual.  Walking can help get rid of the air that was put into your GI tract during the procedure and reduce the bloating. If you had a lower endoscopy (such as a colonoscopy or flexible sigmoidoscopy) you may notice spotting of blood in your stool or on the toilet paper. If you underwent a bowel prep for your procedure, you may not have a normal bowel movement for a few days.  Please Note:  You might notice some irritation and congestion in your nose or some drainage.  This is from the oxygen used during your procedure.  There is no need for concern and it should clear up in a day or so.  SYMPTOMS TO REPORT IMMEDIATELY:   Following lower endoscopy (colonoscopy or flexible sigmoidoscopy):  Excessive amounts of blood in the stool  Significant tenderness or worsening of abdominal pains  Swelling of the abdomen that is new, acute  Fever of 100F or higher   For urgent or emergent issues, a gastroenterologist can be reached at any hour by calling [phone].   DIET:  We do recommend a small meal at first, but then you may proceed to your regular diet.  Drink plenty of fluids but you should avoid alcoholic beverages for 24 hours.try to increase the fiber in your diet, and drink plenty of water.  ACTIVITY:  You should plan to take it easy for the rest of today and you should NOT DRIVE or use heavy machinery until  tomorrow (because of the sedation medicines used during the test).    FOLLOW UP: Our staff will call the number listed on your records the next business day following your procedure to check on you and address any questions or concerns that you may have regarding the information given to you following your procedure. If we do not reach you, we will leave a message.  However, if you are feeling well and you are not experiencing any problems, there is no need to return our call.  We will assume that you have returned to your regular daily activities without incident.  If any biopsies were taken you will be contacted by phone or by letter within the next 1-3 weeks.  Please call us at [phone] if you have not heard about the biopsies in 3 weeks.    SIGNATURES/CONFIDENTIALITY: You and/or your care partner have signed paperwork which will be entered into your electronic medical record.  These signatures attest to the fact that that the information above on your After Visit Summary has been reviewed and is understood.  Full responsibility of the confidentiality of this discharge information lies with you and/or your care-partner. Thank-you for choosing us for your healthcare needs today.

## 2016-04-02 NOTE — Op Note (Signed)
[facility] Patient Name: Mason RadarJohnnie Lubeck Procedure Date: 04/02/2016 1:44 PM MRN: [id number] Endoscopist: Sherilyn CooterHenry L. Myrtie Neitheranis , MD Age: [age] Referring MD:  Date of Birth: 01/27/1956 Gender: Male Account #: [id number] Procedure:                Colonoscopy Indications:              Screening for colorectal malignant neoplasm, This                            is the patient's first colonoscopy Medicines:                Monitored Anesthesia Care Procedure:                Pre-Anesthesia Assessment:                           - Prior to the procedure, a History and Physical                            was performed, and patient medications and                            allergies were reviewed. The patient's tolerance of                            previous anesthesia was also reviewed. The risks                            and benefits of the procedure and the sedation                            options and risks were discussed with the patient.                            All questions were answered, and informed consent                            was obtained. Anticoagulants: The patient has taken                            aspirin. It was decided not to withhold this                            medication prior to the procedure. ASA Grade                            Assessment: II - A patient with mild systemic                            disease. After reviewing the risks and benefits,                            the patient was deemed in satisfactory condition to  undergo the procedure.                           After obtaining informed consent, the colonoscope                            was passed under direct vision. Throughout the                            procedure, the patient's blood pressure, pulse, and                            oxygen saturations were monitored continuously. The                            Model CF-HQ190L [id number]) scope was introduced                           through the anus and advanced to the the cecum,                            identified by appendiceal orifice and ileocecal                            valve. The ileocecal valve, appendiceal orifice,                            and rectum were photographed. The quality of the                            bowel preparation was excellent. The colonoscopy                            was performed without difficulty. The patient                            tolerated the procedure well. The bowel preparation                            used was SUPREP. The quality of the bowel                            preparation was evaluated using the BBPS [facility]                            Bowel Preparation Scale) with scores of: Right                            Colon = 3, Transverse Colon = 3 and Left Colon = 3                            (entire mucosa seen well with no residual staining,  small fragments of stool or opaque liquid). The                            total BBPS score equals 9. Scope In: 1:58:20 PM Scope Out: 2:07:55 PM Scope Withdrawal Time: 0 hours 7 minutes 42 seconds  Total Procedure Duration: 0 hours 9 minutes 35 seconds  Findings:                 Multiple small-mouthed diverticula were found in                            the entire colon. Complications:            No immediate complications. Estimated blood loss:                            None. Estimated Blood Loss:     Estimated blood loss: none. Recommendation:           - Repeat colonoscopy in 10 years for screening                            purposes.                           - Patient has a contact number available for                            emergencies. The signs and symptoms of potential                            delayed complications were discussed with the                            patient. Return to normal activities tomorrow.                            Written discharge  instructions were provided to the                            patient.                           - Resume previous diet.                           - Continue present medications. Henry L. Myrtie Neitheranis, MD 04/02/2016 2:11:59 PM This report has been signed electronically.

## 2016-04-03 ENCOUNTER — Telehealth: Payer: Self-pay | Admitting: *Deleted

## 2016-04-03 NOTE — Telephone Encounter (Signed)
  Follow up Call-  Call back number 04/02/2016  Post procedure Call Back phone  # [phone]  Permission to leave phone message Yes  Some recent data might be hidden     Patient questions:  Do you have a fever, pain , or abdominal swelling? No. Pain Score  0 *  Have you tolerated food without any problems? Yes.    Have you been able to return to your normal activities? Yes.    Do you have any questions about your discharge instructions: Diet   No. Medications  No. Follow up visit  No.  Do you have questions or concerns about your Care? No.  Actions: * If pain score is 4 or above: No action needed, pain <4.

## 2016-04-04 ENCOUNTER — Telehealth: Payer: Self-pay | Admitting: Emergency Medicine

## 2016-04-04 MED ORDER — MELOXICAM 15 MG PO TABS: 15.0000 mg | ORAL_TABLET | Freq: Every day | ORAL | 5 refills | Status: DC | PRN

## 2016-04-04 NOTE — Telephone Encounter (Signed)
Pt called and is waiting for approval of second injection on his neck. He wants to know if you can write a prescription for an antinflammatory medication while he waits. Please advise thanks.

## 2016-04-04 NOTE — Telephone Encounter (Signed)
Ok for USAAmobic rx - done erx

## 2016-06-27 ENCOUNTER — Other Ambulatory Visit: Payer: Self-pay | Admitting: Internal Medicine

## 2016-08-16 ENCOUNTER — Other Ambulatory Visit: Payer: Self-pay | Admitting: Internal Medicine

## 2016-08-24 ENCOUNTER — Encounter: Payer: Self-pay | Admitting: Internal Medicine

## 2016-08-24 ENCOUNTER — Ambulatory Visit (INDEPENDENT_AMBULATORY_CARE_PROVIDER_SITE_OTHER): Payer: 59 | Admitting: Internal Medicine

## 2016-08-24 VITALS — BP 128/74 | HR 61 | Ht 69.0 in | Wt 165.0 lb

## 2016-08-24 DIAGNOSIS — R350 Frequency of micturition: Secondary | ICD-10-CM | POA: Diagnosis not present

## 2016-08-24 DIAGNOSIS — I10 Essential (primary) hypertension: Secondary | ICD-10-CM | POA: Diagnosis not present

## 2016-08-24 DIAGNOSIS — R102 Pelvic and perineal pain: Secondary | ICD-10-CM | POA: Diagnosis not present

## 2016-08-24 DIAGNOSIS — Z0000 Encounter for general adult medical examination without abnormal findings: Secondary | ICD-10-CM | POA: Diagnosis not present

## 2016-08-24 MED ORDER — DOXYCYCLINE HYCLATE 100 MG PO TABS: 100.0000 mg | ORAL_TABLET | Freq: Two times a day (BID) | ORAL | 0 refills | Status: DC

## 2016-08-24 NOTE — Patient Instructions (Signed)
Please take all new medication as prescribed - the antibiotic  Please continue all other medications as before, and refills have been done if requested.  Please have the pharmacy call with any other refills you may need.  Please keep your appointments with your specialists as you may have planned  Please return in 3 months, or sooner if needed, with Lab testing done 3-5 days before  

## 2016-08-24 NOTE — Assessment & Plan Note (Signed)
stable overall by history and exam, recent data reviewed with pt, and pt to continue medical treatment as before,  to f/u any worsening symptoms or concerns BP Readings from Last 3 Encounters:  08/24/16 128/74  04/02/16 134/86  12/30/15 132/84

## 2016-08-24 NOTE — Progress Notes (Signed)
Pre visit review using our clinic review tool, if applicable. No additional management support is needed unless otherwise documented below in the visit note. 

## 2016-08-24 NOTE — Progress Notes (Signed)
Subjective:    Patient ID: Mason Morales, male    DOB: 01/28/1956, 61 y.o.   MRN: [id number]  HPI  Here with 3 wks acute onset dull intermitient mild to mod pain to the lower abd, bilat testicle tenderness and lower back discomfort, No fever, Has had some frequent urination and darker yellow then usual.  Denies worsening reflux, abd pain, dysphagia, n/v, bowel change or blood.o/2 Denies urinary symptoms such as dysuria, urgency, flank pain, hematuria or n/v, fever, chills.  Pt denies polydipsia, polyuria, Has been drinking more coffee lately. Also [facility]itons [facility], last just 2 wks ago with reported neg HIV and chlamydia testing.  Past Medical History:  Diagnosis Date  . Allergic rhinitis, cause unspecified 10/26/2010  . Allergy   . DDD (degenerative disc disease), cervical 10/26/2010  . DDD (degenerative disc disease), lumbar 10/26/2010  . Depression   . Generalized headaches   . GERD (gastroesophageal reflux disease)   . HTN (hypertension) 10/26/2010  . Hypertension   . Kidney stones    Past Surgical History:  Procedure Laterality Date  . MOUTH SURGERY  at 61 yo after horse accident    reports that he quit smoking about 2 years ago. He has never used smokeless tobacco. He reports that he drinks about 3.6 oz of alcohol per week . He reports that he uses drugs, including Marijuana. family history includes Alcohol abuse in his other; Cancer in his other and other; Diabetes in his other and other; Heart disease in his other; Hyperlipidemia in his other; Hypertension in his other; Stroke in his other and other. Allergies  Allergen Reactions  . Sulfa Antibiotics Nausea And Vomiting   Current Outpatient Prescriptions on File Prior to Visit  Medication Sig Dispense Refill  . ALPRAZolam (XANAX) 0.5 MG tablet Take 1 tablet (0.5 mg total) by mouth 2 (two) times daily as needed. for anxiety 60 tablet 1  . amLODipine (NORVASC) 10 MG tablet TAKE 1 TABLET (10 MG TOTAL) BY MOUTH  DAILY. 90 tablet 3  . aspirin 81 MG EC tablet Take 1 tablet (81 mg total) by mouth daily. Swallow whole. 30 tablet 12  . b complex vitamins capsule Take 1 capsule by mouth daily after lunch.     . citalopram (CELEXA) 20 MG tablet Take 1 tablet (20 mg total) by mouth daily. 90 tablet 3  . gabapentin (NEURONTIN) 100 MG capsule TAKE TWO CAPSULES BY MOUTH THREE TIMES A DAY 180 capsule 2  . L-ARGININE PO Take 1 capsule by mouth daily.    . meloxicam (MOBIC) 15 MG tablet Take 1 tablet (15 mg total) by mouth daily. 30 tablet 4  . methocarbamol (ROBAXIN) 500 MG tablet Take 500 mg by mouth as needed for muscle spasms.    . Multiple Vitamin (MULTIVITAMIN WITH MINERALS) TABS tablet Take 1 tablet by mouth daily after lunch.    . naproxen (NAPROSYN) 500 MG tablet Take 1 tablet (500 mg total) by mouth 2 (two) times daily with a meal. 60 tablet 2  . Omega-3 Fatty Acids (OMEGA 3 PO) Take 1 capsule by mouth daily after lunch.    Marland Kitchen omeprazole (PRILOSEC) 20 MG capsule Take 1 capsule (20 mg total) by mouth daily as needed (for heartburn). 30 capsule 4  . Tetrahydrozoline HCl (VISINE OP) Place 1 drop into both eyes daily as needed (allergies).    . thiamine 100 MG tablet Take 100 mg by mouth daily.    . [DISCONTINUED] pantoprazole (PROTONIX)  20 MG tablet Take 1 tablet (20 mg total) by mouth daily. (Patient not taking: Reported on 05/05/2015) 30 tablet 0   No current facility-administered medications on file prior to visit.    Review of Systems  Constitutional: Negative for other unusual diaphoresis or sweats HENT: Negative for ear discharge or swelling Eyes: Negative for other worsening visual disturbances Respiratory: Negative for stridor or other swelling  Gastrointestinal: Negative for worsening distension or other blood Genitourinary: Negative for retention or other urinary change Musculoskeletal: Negative for other MSK pain or swelling Skin: Negative for color change or other new lesions Neurological:  Negative for worsening tremors and other numbness  Psychiatric/Behavioral: Negative for worsening agitation or other fatigue All other system neg per pt    Objective:   Physical Exam BP 128/74   Pulse 61   Ht 5\' 9"  (1.753 m)   Wt 165 lb (74.8 kg)   SpO2 98%   BMI 24.37 kg/m  VS noted,  Constitutional: Pt appears in NAD HENT: Head: NCAT.  Right Ear: External ear normal.  Left Ear: External ear normal.  Eyes: . Pupils are equal, round, and reactive to light. Conjunctivae and EOM are normal Nose: without d/c or deformity Neck: Neck supple. Gross normal ROM Cardiovascular: Normal rate and regular rhythm.   Pulmonary/Chest: Effort normal and breath sounds without rales or wheezing.  Abd:  Soft, NT, ND, + BS, no organomegaly, no flank tender; declines DRE Neurological: Pt is alert. At baseline orientation, motor grossly intact Skin: Skin is warm. No rashes, other new lesions, no LE edema Psychiatric: Pt behavior is normal without agitation  No other exam findings    Assessment & Plan:

## 2016-08-24 NOTE — Assessment & Plan Note (Signed)
c/w prostatitis, for urine studies, but also doxycycline 100 bid x 3 wks,  to f/u any worsening symptoms or concerns

## 2016-09-12 ENCOUNTER — Other Ambulatory Visit: Payer: Self-pay | Admitting: Internal Medicine

## 2016-09-12 ENCOUNTER — Telehealth: Payer: Self-pay | Admitting: Internal Medicine

## 2016-09-12 DIAGNOSIS — R350 Frequency of micturition: Secondary | ICD-10-CM

## 2016-09-12 NOTE — Telephone Encounter (Signed)
Referral done

## 2016-09-12 NOTE — Telephone Encounter (Signed)
celexa done erx 

## 2016-09-12 NOTE — Telephone Encounter (Signed)
Patient states that the issue he had on 5/4 is still not cleared up.  Has almost taken all pills and does not feel relief.  Is requesting referral to urologist.

## 2016-09-14 NOTE — Telephone Encounter (Signed)
Patient knows to call in 1 to 2 weeks if he does not hear anything about scheduling appt.

## 2016-09-21 ENCOUNTER — Other Ambulatory Visit: Payer: Self-pay | Admitting: Internal Medicine

## 2016-10-16 ENCOUNTER — Encounter: Payer: Self-pay | Admitting: Internal Medicine

## 2016-10-23 ENCOUNTER — Encounter (INDEPENDENT_AMBULATORY_CARE_PROVIDER_SITE_OTHER): Payer: Self-pay | Admitting: *Deleted

## 2016-10-23 VITALS — BP 159/87 | HR 60 | Temp 97.4°F | Wt 168.8 lb

## 2016-10-23 DIAGNOSIS — Z006 Encounter for examination for normal comparison and control in clinical research program: Secondary | ICD-10-CM

## 2016-10-23 LAB — CBC WITH DIFFERENTIAL/PLATELET
BASOS ABS: 0 {cells}/uL (ref 0–200)
BASOS PCT: 0 %
EOS ABS: 96 {cells}/uL (ref 15–500)
Eosinophils Relative: 2 %
HEMATOCRIT: 44.5 % (ref 38.5–50.0)
HEMOGLOBIN: 14.8 g/dL (ref 13.2–17.1)
LYMPHS ABS: 2256 {cells}/uL (ref 850–3900)
Lymphocytes Relative: 47 %
MCH: 30 pg (ref 27.0–33.0)
MCHC: 33.3 g/dL (ref 32.0–36.0)
MCV: 90.3 fL (ref 80.0–100.0)
MONO ABS: 336 {cells}/uL (ref 200–950)
MONOS PCT: 7 %
MPV: 9.4 fL (ref 7.5–12.5)
NEUTROS ABS: 2112 {cells}/uL (ref 1500–7800)
Neutrophils Relative %: 44 %
PLATELETS: 193 10*3/uL (ref 140–400)
RBC: 4.93 MIL/uL (ref 4.20–5.80)
RDW: 13.9 % (ref 11.0–15.0)
WBC: 4.8 10*3/uL (ref 3.8–10.8)

## 2016-10-23 LAB — HEPATITIS B SURFACE ANTIGEN: HEP B S AG: NEGATIVE

## 2016-10-23 LAB — HEPATITIS C ANTIBODY: HCV AB: NEGATIVE

## 2016-10-23 NOTE — Progress Notes (Signed)
Dayton ScrapeJohnnie is here for [id number] screening visit. After verifying the correct version I explained/reviewed the informed consent in the language that he understood. Risk, benefits, responsibilities, and other options were reviewed. I answered his questions. Comprehension was assessed. He was given adequate time to consider his options. He verbalized understanding and signed the consent witnessed by me. I then gave him a copy of the consent.  HIV counseling was given including description of the testing and how it is done; explained HIV and how it is spread and ways to prevent it; Discussed the meaning of the possible test results and what impact the test results may have on the participant. PTID assigned. Confirmation of eligibility for screening was confirmed. Blood drawn at 10:18. Medical history, medications, bleeding history, and signs/symptoms were reviewed. He will return on 7/10 @ 11:00am to see Dr. Daiva EvesVan Dam for CPE and to get an ECG and HIV RNA done. Will schedule entry visit shortly after that visit if deemed eligible for study entry.

## 2016-10-23 NOTE — Progress Notes (Signed)
Met with potential ppt in Lisa's office to introduce self and explain role as Production designer, theatre/television/film. Ppt heard about the study from a current ppt. Ppt prefers appt reminder call or text; confirmed contact phone number. Ppt gave permission to send mail for retention; confirmed mailing address. Ppt has an active mychart; showed them the app and explained features.

## 2016-10-24 ENCOUNTER — Emergency Department (HOSPITAL_COMMUNITY)
Admission: EM | Admit: 2016-10-24 | Discharge: 2016-10-25 | Disposition: A | Discharge status: Home / Self Care | Payer: 59 | Attending: Emergency Medicine | Admitting: Emergency Medicine

## 2016-10-24 ENCOUNTER — Encounter (HOSPITAL_COMMUNITY): Payer: Self-pay | Admitting: *Deleted

## 2016-10-24 DIAGNOSIS — Z87891 Personal history of nicotine dependence: Secondary | ICD-10-CM | POA: Diagnosis not present

## 2016-10-24 DIAGNOSIS — I10 Essential (primary) hypertension: Secondary | ICD-10-CM | POA: Insufficient documentation

## 2016-10-24 DIAGNOSIS — Z79899 Other long term (current) drug therapy: Secondary | ICD-10-CM | POA: Insufficient documentation

## 2016-10-24 DIAGNOSIS — R1032 Left lower quadrant pain: Secondary | ICD-10-CM | POA: Diagnosis not present

## 2016-10-24 DIAGNOSIS — Z791 Long term (current) use of non-steroidal anti-inflammatories (NSAID): Secondary | ICD-10-CM | POA: Insufficient documentation

## 2016-10-24 LAB — URINALYSIS, ROUTINE W REFLEX MICROSCOPIC
BILIRUBIN URINE: NEGATIVE
GLUCOSE, UA: NEGATIVE mg/dL
Hgb urine dipstick: NEGATIVE
KETONES UR: NEGATIVE mg/dL
LEUKOCYTES UA: NEGATIVE
NITRITE: NEGATIVE
PH: 5 (ref 5.0–8.0)
PROTEIN: NEGATIVE mg/dL
Specific Gravity, Urine: 1.023 (ref 1.005–1.030)

## 2016-10-24 LAB — COMPREHENSIVE METABOLIC PANEL
ALBUMIN: 4.3 g/dL (ref 3.6–5.1)
ALK PHOS: 63 U/L (ref 40–115)
ALT: 9 U/L (ref 9–46)
AST: 17 U/L (ref 10–35)
BUN: 23 mg/dL (ref 7–25)
CO2: 27 mmol/L (ref 20–31)
Calcium: 8.8 mg/dL (ref 8.6–10.3)
Chloride: 104 mmol/L (ref 98–110)
Creat: 1 mg/dL (ref 0.70–1.33)
Glucose, Bld: 85 mg/dL (ref 65–99)
Potassium: 4.1 mmol/L (ref 3.5–5.3)
Sodium: 138 mmol/L (ref 135–146)
TOTAL PROTEIN: 6.8 g/dL (ref 6.1–8.1)
Total Bilirubin: 0.4 mg/dL (ref 0.2–1.2)

## 2016-10-24 LAB — HIV ANTIBODY (ROUTINE TESTING W REFLEX): HIV 1&2 Ab, 4th Generation: NONREACTIVE

## 2016-10-24 NOTE — ED Triage Notes (Addendum)
Pt complains of LLQ/left back pain radiating to his left testicle for the past 2-3 hours. Pt states the pain started while he was walking around today. Pt states he has had similar pain in the past when he had kidney stones.   Pt states he has a different sensation while urinating and has darker urine.

## 2016-10-25 ENCOUNTER — Emergency Department (HOSPITAL_COMMUNITY): Payer: 59

## 2016-10-25 ENCOUNTER — Encounter (HOSPITAL_COMMUNITY): Payer: Self-pay

## 2016-10-25 LAB — I-STAT CHEM 8, ED
BUN: 29 mg/dL — AB (ref 6–20)
CHLORIDE: 102 mmol/L (ref 101–111)
CREATININE: 1.1 mg/dL (ref 0.61–1.24)
Calcium, Ion: 1.17 mmol/L (ref 1.15–1.40)
GLUCOSE: 85 mg/dL (ref 65–99)
HCT: 40 % (ref 39.0–52.0)
Hemoglobin: 13.6 g/dL (ref 13.0–17.0)
POTASSIUM: 3.7 mmol/L (ref 3.5–5.1)
Sodium: 138 mmol/L (ref 135–145)
TCO2: 25 mmol/L (ref 0–100)

## 2016-10-25 MED ORDER — TRAMADOL HCL 50 MG PO TABS: 50.0000 mg | ORAL_TABLET | Freq: Four times a day (QID) | ORAL | 0 refills | Status: DC | PRN

## 2016-10-25 MED ORDER — IOPAMIDOL (ISOVUE-300) INJECTION 61%
100.0000 mL | Freq: Once | INTRAVENOUS | Status: AC | PRN
  Administered 2016-10-25: 100 mL via INTRAVENOUS

## 2016-10-25 MED ORDER — IOPAMIDOL (ISOVUE-300) INJECTION 61%
INTRAVENOUS | Status: AC
  Administered 2016-10-25: 100 mL via INTRAVENOUS
  Filled 2016-10-25: qty 100

## 2016-10-25 MED ORDER — FAMOTIDINE IN NACL 20-0.9 MG/50ML-% IV SOLN
20.0000 mg | Freq: Once | INTRAVENOUS | Status: AC
  Administered 2016-10-25: 20 mg via INTRAVENOUS
  Filled 2016-10-25: qty 50

## 2016-10-25 MED ORDER — DIPHENHYDRAMINE HCL 50 MG/ML IJ SOLN
25.0000 mg | Freq: Once | INTRAMUSCULAR | Status: AC
  Administered 2016-10-25: 25 mg via INTRAVENOUS
  Filled 2016-10-25: qty 1

## 2016-10-25 MED ORDER — METHYLPREDNISOLONE SODIUM SUCC 125 MG IJ SOLR
125.0000 mg | Freq: Once | INTRAMUSCULAR | Status: AC
  Administered 2016-10-25: 125 mg via INTRAVENOUS
  Filled 2016-10-25: qty 2

## 2016-10-25 NOTE — ED Provider Notes (Signed)
[facility]-EMERGENCY DEPT Provider Note   CSN: [id number] Arrival date & time: 10/24/16  2013     History   Chief Complaint Chief Complaint  Patient presents with  . Groin Pain  . Back Pain    HPI [name] is a 61 y.o. male.  61 year old male presents to the emergency department for evaluation of left lower back pain radiating to his left lower quadrant and left testicle. Symptoms have been worsening over the past 2-3 hours, though he reports similar pain in his back beginning 2 weeks ago. Pain is worse with ambulation and unrelieved by meloxicam. Patient notes similar pain in the past with kidney stones. He has had some nausea without vomiting. No fevers, dysuria, hematuria, bowel changes, scrotal swelling, penile discharge. No history of abdominal surgeries.   The history is provided by the patient. No language interpreter was used.    Past Medical History:  Diagnosis Date  . Allergic rhinitis, cause unspecified 10/26/2010  . Allergy   . DDD (degenerative disc disease), cervical 10/26/2010  . DDD (degenerative disc disease), lumbar 10/26/2010  . Depression   . Generalized headaches   . GERD (gastroesophageal reflux disease)   . HTN (hypertension) 10/26/2010  . Hypertension   . Kidney stones     Patient Active Problem List   Diagnosis Date Noted  . Pelvic pain 08/24/2016  . Left cervical radiculopathy 05/20/2015  . Anxiety state 05/20/2015  . Back pain 03/18/2012  . GERD (gastroesophageal reflux disease) 10/26/2010  . Allergic rhinitis, cause unspecified 10/26/2010  . HTN (hypertension) 10/26/2010  . Kidney stones 10/26/2010  . DDD (degenerative disc disease), cervical 10/26/2010  . DDD (degenerative disc disease), lumbar 10/26/2010  . Chest pain 10/26/2010  . Left shoulder pain 10/26/2010  . Encounter for well adult exam with abnormal findings 10/26/2010    Past Surgical History:  Procedure Laterality Date  . MOUTH SURGERY  at 61 yo after horse accident        Home Medications    Prior to Admission medications   Medication Sig Start Date End Date Taking? Authorizing Provider  amLODipine (NORVASC) 10 MG tablet TAKE ONE TABLET BY MOUTH DAILY 09/21/16  Yes Corwin Levins, MD  citalopram (CELEXA) 20 MG tablet TAKE 1 TABLET BY MOUTH EVERY DAY 09/12/16  Yes Corwin Levins, MD  gabapentin (NEURONTIN) 100 MG capsule TAKE TWO CAPSULES BY MOUTH THREE TIMES A DAY 06/27/16  Yes Corwin Levins, MD  meloxicam (MOBIC) 15 MG tablet Take 1 tablet (15 mg total) by mouth daily. 08/16/16  Yes Corwin Levins, MD  methocarbamol (ROBAXIN) 500 MG tablet Take 500 mg by mouth as needed for muscle spasms.   Yes [provider]  omeprazole (PRILOSEC) 20 MG capsule Take 1 capsule (20 mg total) by mouth daily as needed (for heartburn). 08/16/16  Yes Corwin Levins, MD  ALPRAZolam Prudy Feeler) 0.5 MG tablet Take 1 tablet (0.5 mg total) by mouth 2 (two) times daily as needed. for anxiety Patient not taking: Reported on 10/24/2016 10/04/15   Corwin Levins, MD  aspirin 81 MG EC tablet Take 1 tablet (81 mg total) by mouth daily. Swallow whole. Patient not taking: Reported on 10/24/2016 03/18/12   Corwin Levins, MD  doxycycline (VIBRA-TABS) 100 MG tablet Take 1 tablet (100 mg total) by mouth 2 (two) times daily. Patient not taking: Reported on 10/24/2016 08/24/16   Corwin Levins, MD  naproxen (NAPROSYN) 500 MG tablet Take 1 tablet (500 mg total) by mouth  2 (two) times daily with a meal. Patient not taking: Reported on 10/24/2016 05/20/15   Corwin Levins, MD  traMADol (ULTRAM) 50 MG tablet Take 1 tablet (50 mg total) by mouth every 6 (six) hours as needed for severe pain. 10/25/16   Antony Madura, PA-C    Family History Family History  Problem Relation Age of Onset  . Hyperlipidemia Other   . Heart disease Other   . Stroke Other   . Hypertension Other   . Diabetes Other   . Cancer Other        prostate cancer  . Diabetes Other   . Alcohol abuse Other   . Cancer Other        breast  cancer  . Stroke Other   . Colon cancer Neg Hx   . Esophageal cancer Neg Hx   . Pancreatic cancer Neg Hx   . Rectal cancer Neg Hx   . Stomach cancer Neg Hx     Social History Social History  Substance Use Topics  . Smoking status: Former Smoker    Quit date: 03/20/2014  . Smokeless tobacco: Never Used  . Alcohol use 3.6 oz/week    6 Cans of beer per week     Allergies   Isovue [iopamidol] and Sulfa antibiotics   Review of Systems Review of Systems Ten systems reviewed and are negative for acute change, except as noted in the HPI.    Physical Exam Updated Vital Signs BP 129/90 (BP Location: Right Arm)   Pulse 69   Temp (!) 97.1 F (36.2 C) (Oral)   Resp 18   SpO2 98%   Physical Exam  Constitutional: He is oriented to person, place, and time. He appears well-developed and well-nourished. No distress.  Nontoxic appearing; pleasant.  HENT:  Head: Normocephalic and atraumatic.  Eyes: Conjunctivae and EOM are normal. No scleral icterus.  Neck: Normal range of motion.  Cardiovascular: Normal rate, regular rhythm and intact distal pulses.   Pulmonary/Chest: Effort normal. No respiratory distress. He has no wheezes.  Respirations even and unlabored  Abdominal: Soft. He exhibits no distension. There is tenderness. There is no guarding. Hernia confirmed negative in the right inguinal area and confirmed negative in the left inguinal area.    Genitourinary: Penis normal. Right testis shows no mass and no swelling. Right testis is descended. Left testis shows no mass and no swelling. Left testis is descended. Circumcised.     Genitourinary Comments: Exam chaperoned by Porfirio Mylar, nurse tech.  Musculoskeletal: Normal range of motion.       Back:  Neurological: He is alert and oriented to person, place, and time. He exhibits normal muscle tone. Coordination normal.  Ambulatory with steady gait.  Skin: Skin is warm and dry. No rash noted. He is not diaphoretic. No erythema. No  pallor.  Psychiatric: He has a normal mood and affect. His behavior is normal.  Nursing note and vitals reviewed.    ED Treatments / Results  Labs (all labs ordered are listed, but only abnormal results are displayed) Labs Reviewed  I-STAT CHEM 8, ED - Abnormal; Notable for the following:       Result Value   BUN 29 (*)    All other components within normal limits  URINALYSIS, ROUTINE W REFLEX MICROSCOPIC    EKG  EKG Interpretation None       Radiology Ct Abdomen Pelvis W Contrast  Result Date: 10/25/2016 CLINICAL DATA:  Acute onset of left lower quadrant abdominal pain and  back pain. Left testicular pain. Initial encounter. EXAM: CT ABDOMEN AND PELVIS WITH CONTRAST TECHNIQUE: Multidetector CT imaging of the abdomen and pelvis was performed using the standard protocol following bolus administration of intravenous contrast. CONTRAST:  100 mL of Isovue 300 IV contrast COMPARISON:  CTA of the chest and abdomen performed 02/26/2015 FINDINGS: Lower chest: Minimal scarring is noted at the lung bases. The visualized portions of the mediastinum are unremarkable. Hepatobiliary: The liver is unremarkable in appearance. The gallbladder is unremarkable in appearance. The common bile duct remains normal in caliber. Pancreas: The pancreas is within normal limits. Spleen: The spleen is unremarkable in appearance. Adrenals/Urinary Tract: The adrenal glands are unremarkable in appearance. Tiny nonobstructing bilateral renal stones measure up to 2 mm in size. Tiny bilateral renal cysts are seen. There is no hydronephrosis. No obstructing ureteral stones are identified. No perinephric stranding is appreciated. 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 The stomach is otherwise unremarkable. The small bowel is within normal limits. The appendix is normal in caliber, without evidence of appendicitis. Mild diverticulosis is noted along the proximal sigmoid colon, without evidence of diverticulitis.  Vascular/Lymphatic: Scattered calcification is seen along the abdominal aorta and its branches. The abdominal aorta is otherwise grossly unremarkable. The inferior vena cava is grossly unremarkable. No retroperitoneal lymphadenopathy is seen. No pelvic sidewall lymphadenopathy is identified. Reproductive: The bladder is mildly distended and grossly remarkable. The prostate remains normal in size. A right-sided hydrocele is noted. Other: No additional soft tissue abnormalities are seen. Musculoskeletal: No acute osseous abnormalities are identified. Multilevel vacuum phenomenon and disc space narrowing are noted along the lumbar spine, with mild endplate sclerosis. The visualized musculature is unremarkable in appearance. IMPRESSION: 1. No acute abnormality seen to explain the patient's symptoms. 2. Right-sided hydrocele noted. 3. Gastric varices noted, somewhat more prominent than in 2016. Would correlate for any associated symptoms, and consider endoscopy if deemed clinically appropriate. 4. Tiny nonobstructing bilateral renal stones measure up to 2 mm in size. Tiny bilateral renal cysts seen. 5. Scattered aortic atherosclerosis. 6. Mild diverticulosis at the proximal sigmoid colon, without evidence of diverticulitis. 7. Mild degenerative change along the lumbar spine. Electronically Signed   By: Roanna RaiderJeffery  Chang M.D.   On: 10/25/2016 01:48    Procedures Procedures (including critical care time)  Medications Ordered in ED Medications  iopamidol (ISOVUE-300) 61 % injection 100 mL (100 mLs Intravenous Contrast Given 10/25/16 0122)  diphenhydrAMINE (BENADRYL) injection 25 mg (25 mg Intravenous Given 10/25/16 0150)  methylPREDNISolone sodium succinate (SOLU-MEDROL) 125 mg/2 mL injection 125 mg (125 mg Intravenous Given 10/25/16 0149)  famotidine (PEPCID) IVPB 20 mg premix (0 mg Intravenous Stopped 10/25/16 0251)    2:00 AM Benadryl, Pepcid, and Solumedrol ordered for reaction to IV contrast dye.  3:00  AM Patient reassessed. Resting comfortably. Denies difficulty swallowing and SOB. No urticaria or angioedema noted.   Initial Impression / Assessment and Plan / ED Course  I have reviewed the triage vital signs and the nursing notes.  Pertinent labs & imaging results that were available during my care of the patient were reviewed by me and considered in my medical decision making (see chart for details).     61 year old male presents to the emergency department for left low back pain radiating towards his left lower quadrant and left testicle. Pain is reproducible on palpation during exam. He also notes aggravation of symptoms with ambulation. Patient is neurovascularly intact. He is ambulatory without difficulty. CT scan obtained to evaluate for possible hernia given location  of symptoms. CT reveals no acute abnormality to explain the patient's pain.  Suspect musculoskeletal etiology/groin strain. Will continue with supportive management and refer to PCP for follow-up. I do not believe further emergent workup is indicated at this time. Return precautions discussed and provided. Patient discharged in stable condition with no unaddressed concerns.   Final Clinical Impressions(s) / ED Diagnoses   Final diagnoses:  Groin pain, left    New Prescriptions Discharge Medication List as of 10/25/2016  3:10 AM    START taking these medications   Details  traMADol (ULTRAM) 50 MG tablet Take 1 tablet (50 mg total) by mouth every 6 (six) hours as needed for severe pain., Starting Thu 10/25/2016, Print         Antony Madura, PA-C 10/25/16 4098    Nicanor Alcon, April, MD 10/25/16 1191

## 2016-10-30 ENCOUNTER — Encounter (INDEPENDENT_AMBULATORY_CARE_PROVIDER_SITE_OTHER): Payer: 59 | Admitting: *Deleted

## 2016-10-30 VITALS — BP 146/85 | HR 78 | Temp 97.4°F | Wt 166.8 lb

## 2016-10-30 DIAGNOSIS — N200 Calculus of kidney: Secondary | ICD-10-CM

## 2016-10-30 DIAGNOSIS — Z006 Encounter for examination for normal comparison and control in clinical research program: Secondary | ICD-10-CM

## 2016-10-30 NOTE — Progress Notes (Signed)
Subjective:   Chief complaint: here for CPE for HPTN study, had recent kidney stones and still with some suprapubic pain    Patient ID: Mason Morales, male    DOB: [birth year], 61 y.o.   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 He is doing well and without many complaints. He was seen in the ED recently for kidney stones on July 5th. He had flank pain and pain in his suprapubic area. He has residual pain in suprapubic area now. He sees Dr. Jonny Ruiz for PCP at [facility]. He is not on PrEP currently.  He has tattoo on arm but not on buttocks (where it could interfere with interpretation of potential injection site reactinos)    Past Medical History:  Diagnosis Date  . Allergic rhinitis, cause unspecified 10/26/2010  . Allergy   . DDD (degenerative disc disease), cervical 10/26/2010  . DDD (degenerative disc disease), lumbar 10/26/2010  . Depression   . Generalized headaches   . GERD (gastroesophageal reflux disease)   . HTN (hypertension) 10/26/2010  . Hypertension   . Kidney stones     Past Surgical History:  Procedure Laterality Date  . MOUTH SURGERY  at 61 yo after horse accident    Family History  Problem Relation Age of Onset  . Hyperlipidemia Other   . Heart disease Other   . Stroke Other   . Hypertension Other   . Diabetes Other   . Cancer Other        prostate cancer  . Diabetes Other   . Alcohol abuse Other   . Cancer Other        breast cancer  . Stroke Other   . Colon cancer Neg Hx   . Esophageal cancer Neg Hx   . Pancreatic cancer Neg Hx   . Rectal cancer Neg Hx   . Stomach cancer Neg Hx       Social History   Social History  . Marital status: Single    Spouse name: N/A  . Number of children: N/A  . Years of education: 104   Occupational History  . [employer] Audiological scientist   Social History Main Topics  . Smoking status: Former Smoker    Quit date: 03/20/2014  . Smokeless tobacco: Never Used  . Alcohol  use 3.6 oz/week    6 Cans of beer per week  . Drug use: Yes    Types: Marijuana     Comment: THC-daily  . Sexual activity: Not on file   Other Topics Concern  . Not on file   Social History Narrative  . No narrative on file    Allergies  Allergen Reactions  . Isovue [Iopamidol] Hives and Itching  . Sulfa Antibiotics Nausea And Vomiting     Current Outpatient Prescriptions:  .  ALPRAZolam (XANAX) 0.5 MG tablet, Take 1 tablet (0.5 mg total) by mouth 2 (two) times daily as needed. for anxiety (Patient not taking: Reported on 10/24/2016), Disp: 60 tablet, Rfl: 1 .  amLODipine (NORVASC) 10 MG tablet, TAKE ONE TABLET BY MOUTH DAILY, Disp: 30 tablet, Rfl: 11 .  aspirin 81 MG EC tablet, Take 1 tablet (81 mg total) by mouth daily. Swallow whole. (Patient not taking: Reported on 10/24/2016), Disp: 30 tablet, Rfl: 12 .  citalopram (CELEXA) 20 MG tablet, TAKE 1 TABLET BY MOUTH EVERY DAY, Disp: 90 tablet, Rfl: 2 .  doxycycline (VIBRA-TABS) 100 MG tablet, Take  1 tablet (100 mg total) by mouth 2 (two) times daily. (Patient not taking: Reported on 10/24/2016), Disp: 42 tablet, Rfl: 0 .  gabapentin (NEURONTIN) 100 MG capsule, TAKE TWO CAPSULES BY MOUTH THREE TIMES A DAY, Disp: 180 capsule, Rfl: 2 .  meloxicam (MOBIC) 15 MG tablet, Take 1 tablet (15 mg total) by mouth daily., Disp: 30 tablet, Rfl: 4 .  methocarbamol (ROBAXIN) 500 MG tablet, Take 500 mg by mouth as needed for muscle spasms., Disp: , Rfl:  .  naproxen (NAPROSYN) 500 MG tablet, Take 1 tablet (500 mg total) by mouth 2 (two) times daily with a meal. (Patient not taking: Reported on 10/24/2016), Disp: 60 tablet, Rfl: 2 .  omeprazole (PRILOSEC) 20 MG capsule, Take 1 capsule (20 mg total) by mouth daily as needed (for heartburn)., Disp: 30 capsule, Rfl: 4 .  traMADol (ULTRAM) 50 MG tablet, Take 1 tablet (50 mg total) by mouth every 6 (six) hours as needed for severe pain., Disp: 15 tablet, Rfl: 0    Review of Systems  Constitutional: Negative for  chills and fever.  HENT: Negative for congestion and sore throat.   Eyes: Negative for photophobia.  Respiratory: Negative for cough, shortness of breath and wheezing.   Cardiovascular: Negative for chest pain, palpitations and leg swelling.  Gastrointestinal: Positive for abdominal pain. Negative for blood in stool, constipation, diarrhea, nausea and vomiting.  Genitourinary: Negative for dysuria, flank pain and hematuria.  Musculoskeletal: Negative for back pain and myalgias.  Skin: Negative for rash.  Neurological: Negative for dizziness, weakness and headaches.  Hematological: Does not bruise/bleed easily.  Psychiatric/Behavioral: Negative for suicidal ideas.       Objective:   Physical Exam  Constitutional: He is oriented to person, place, and time. He appears well-developed and well-nourished. No distress.  HENT:  Head: Normocephalic and atraumatic.  Mouth/Throat: Uvula is midline, oropharynx is clear and moist and mucous membranes are normal. No oropharyngeal exudate.  Eyes: Conjunctivae and EOM are normal. Pupils are equal, round, and reactive to light. No scleral icterus.  Neck: Normal range of motion. Neck supple. No JVD present. No tracheal deviation present. No thyromegaly present.  Cardiovascular: Normal rate and regular rhythm.  Exam reveals no gallop and no friction rub.   No murmur heard. Pulmonary/Chest: Effort normal and breath sounds normal. No respiratory distress. He has no wheezes.  Abdominal: Soft. Bowel sounds are normal. He exhibits no distension and no mass. There is no hepatosplenomegaly. There is tenderness in the suprapubic area. There is no rigidity, no rebound, no guarding and no CVA tenderness.  Musculoskeletal: He exhibits no edema or tenderness.  Lymphadenopathy:       Head (right side): No submental, no submandibular, no tonsillar, no posterior auricular and no occipital adenopathy present.       Head (left side): No submental, no submandibular, no  tonsillar, no preauricular, no posterior auricular and no occipital adenopathy present.    He has no cervical adenopathy.  Neurological: He is alert and oriented to person, place, and time. He has normal strength. He displays no tremor. No cranial nerve deficit or sensory deficit. He exhibits normal muscle tone. He displays no seizure activity. Coordination normal. GCS eye subscore is 4. GCS verbal subscore is 5. GCS motor subscore is 6.  Skin: Skin is warm and dry. No rash noted. He is not diaphoretic. No erythema. No pallor.  Psychiatric: He has a normal mood and affect. His behavior is normal. Judgment and thought content normal.  Tattoo on right arm of turtle and another animal     Assessment & Plan:    Normal CPE with exception of his residual suprapubic tenderness related to his recent kidney stones.  I will review EKG and labs.

## 2016-10-30 NOTE — Addendum Note (Signed)
Addended by: Maury DusASNOIT, Keiland Pickering H on: 10/30/2016 01:00 PM   Modules accepted: Orders

## 2016-10-30 NOTE — Progress Notes (Signed)
Study: A Phase 2b/3 Double Blind Safety and Efficacy Study of Injectable Cabotegravir compared to Daily Oral Tenofovir Disoproxil Fumarate/Emtricitabine (TDF/FTC), For Pre-Exposure Prophylaxis in HIV-Uninfected Cisgender Men and Transgender Women who have sex with Men.  Medication: Investigational Injectable Cabotegravir/placebo compared to Truvada/placebo. Duration: Around 4 years.  Mason ScrapeJohnnie is here for screening visit. Recently seen in [facility] for (L) groin and back pain. Dx with kidney stone. Seen today by Dr. Daiva EvesVan Morales for CPE. Had EKG: QTcB 416ms and HIV RNA drawn. Entry visit scheduled for 7/19 pending lab results.

## 2016-11-01 LAB — HIV-1 RNA QUANT-NO REFLEX-BLD
HIV 1 RNA QUANT: NOT DETECTED {copies}/mL
HIV-1 RNA QUANT, LOG: NOT DETECTED {Log_copies}/mL

## 2016-11-08 ENCOUNTER — Encounter: Payer: 59 | Admitting: *Deleted

## 2016-11-08 VITALS — BP 152/84 | HR 57 | Temp 97.3°F | Wt 166.2 lb

## 2016-11-08 DIAGNOSIS — Z006 Encounter for examination for normal comparison and control in clinical research program: Secondary | ICD-10-CM

## 2016-11-08 LAB — CBC WITH DIFFERENTIAL/PLATELET
BASOS ABS: 0 {cells}/uL (ref 0–200)
Basophils Relative: 0 %
EOS PCT: 2 %
Eosinophils Absolute: 104 cells/uL (ref 15–500)
HCT: 45.7 % (ref 38.5–50.0)
Hemoglobin: 15.2 g/dL (ref 13.2–17.1)
LYMPHS PCT: 55 %
Lymphs Abs: 2860 cells/uL (ref 850–3900)
MCH: 30.3 pg (ref 27.0–33.0)
MCHC: 33.3 g/dL (ref 32.0–36.0)
MCV: 91 fL (ref 80.0–100.0)
MONOS PCT: 7 %
MPV: 9.8 fL (ref 7.5–12.5)
Monocytes Absolute: 364 cells/uL (ref 200–950)
NEUTROS PCT: 36 %
Neutro Abs: 1872 cells/uL (ref 1500–7800)
PLATELETS: 185 10*3/uL (ref 140–400)
RBC: 5.02 MIL/uL (ref 4.20–5.80)
RDW: 13.6 % (ref 11.0–15.0)
WBC: 5.2 10*3/uL (ref 3.8–10.8)

## 2016-11-08 LAB — LIPID PANEL
CHOLESTEROL: 168 mg/dL (ref <200)
HDL: 53 mg/dL (ref >40)
LDL Cholesterol: 91 mg/dL (ref <100)
TRIGLYCERIDES: 119 mg/dL (ref <150)
Total CHOL/HDL Ratio: 3.2 Ratio (ref <5.0)
VLDL: 24 mg/dL (ref <30)

## 2016-11-08 LAB — LIPASE: LIPASE: 39 U/L (ref 7–60)

## 2016-11-08 LAB — URINALYSIS, ROUTINE W REFLEX MICROSCOPIC
BILIRUBIN URINE: NEGATIVE
Glucose, UA: NEGATIVE
Hgb urine dipstick: NEGATIVE
KETONES UR: NEGATIVE
Leukocytes, UA: NEGATIVE
NITRITE: NEGATIVE
PH: 7.5 (ref 5.0–8.0)
Protein, ur: NEGATIVE
SPECIFIC GRAVITY, URINE: 1.012 (ref 1.001–1.035)

## 2016-11-08 LAB — COMPREHENSIVE METABOLIC PANEL
ALBUMIN: 4.1 g/dL (ref 3.6–5.1)
ALT: 11 U/L (ref 9–46)
AST: 19 U/L (ref 10–35)
Alkaline Phosphatase: 69 U/L (ref 40–115)
BUN: 15 mg/dL (ref 7–25)
CHLORIDE: 103 mmol/L (ref 98–110)
CO2: 26 mmol/L (ref 20–31)
CREATININE: 1.08 mg/dL (ref 0.70–1.33)
Calcium: 9 mg/dL (ref 8.6–10.3)
GLUCOSE: 88 mg/dL (ref 65–99)
Potassium: 4.2 mmol/L (ref 3.5–5.3)
SODIUM: 140 mmol/L (ref 135–146)
Total Bilirubin: 0.5 mg/dL (ref 0.2–1.2)
Total Protein: 6.7 g/dL (ref 6.1–8.1)

## 2016-11-08 LAB — AMYLASE: Amylase: 72 U/L (ref 21–101)

## 2016-11-08 LAB — HEPATITIS B SURFACE ANTIBODY,QUALITATIVE: Hep B S Ab: REACTIVE — AB

## 2016-11-08 LAB — HEPATITIS B CORE ANTIBODY, IGM: Hep B C IgM: NONREACTIVE

## 2016-11-08 LAB — CK: Total CK: 116 U/L (ref 44–196)

## 2016-11-08 LAB — PHOSPHORUS: Phosphorus: 2.6 mg/dL (ref 2.5–4.5)

## 2016-11-08 MED ORDER — EMTRICITABINE-TENOFOVIR DF 200-300 MG PO TABS: ORAL_TABLET | ORAL | 11 refills | Status: DC

## 2016-11-08 MED ORDER — STUDY - INVESTIGATIONAL MEDICATION: 99 refills | Status: DC

## 2016-11-08 NOTE — Progress Notes (Signed)
Study: A Phase 2b/3 Double Blind Safety and Efficacy Study of Injectable Cabotegravir compared to Daily Oral Tenofovir Disoproxil Fumarate/Emtricitabine (TDF/FTC), For Pre-Exposure Prophylaxis in HIV-Uninfected Cisgender Men and Transgender Women who have sex with Men.  Medication: Investigational Injectable Cabotegravir/placebo compared to Truvada/placebo. Duration: Around 4 years.  Dayton ScrapeJohnnie is here for entry visit. After confirming eligibility lab work was obtained. 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 Reviewed proper dosing of medication along with potential side effects. Agreed to call with any questions or concerns. Adherence counseling provided. Plans to start study medication today. Next visit scheduled for 8/1 @ 9:00am.

## 2016-11-09 LAB — RPR: RPR Ser Ql: REACTIVE — AB

## 2016-11-09 LAB — HIV ANTIBODY (ROUTINE TESTING W REFLEX): HIV: NONREACTIVE

## 2016-11-09 LAB — RPR TITER

## 2016-11-09 LAB — FLUORESCENT TREPONEMAL AB(FTA)-IGG-BLD: FLUORESCENT TREPONEMAL ABS: REACTIVE — AB

## 2016-11-09 LAB — GC/CHLAMYDIA PROBE AMP
CT PROBE, AMP APTIMA: NOT DETECTED
GC PROBE AMP APTIMA: NOT DETECTED

## 2016-11-10 LAB — CT/NG RNA, TMA RECTAL
Chlamydia Trachomatis RNA: NOT DETECTED
NEISSERIA GONORRHOEAE RNA: NOT DETECTED

## 2016-11-21 ENCOUNTER — Encounter (INDEPENDENT_AMBULATORY_CARE_PROVIDER_SITE_OTHER): Payer: Self-pay | Admitting: *Deleted

## 2016-11-21 VITALS — BP 149/82 | HR 57 | Temp 97.3°F | Wt 167.2 lb

## 2016-11-21 DIAGNOSIS — Z006 Encounter for examination for normal comparison and control in clinical research program: Secondary | ICD-10-CM

## 2016-11-21 LAB — CBC WITH DIFFERENTIAL/PLATELET
BASOS ABS: 0 {cells}/uL (ref 0–200)
Basophils Relative: 0 %
EOS PCT: 2 %
Eosinophils Absolute: 104 cells/uL (ref 15–500)
HCT: 45.2 % (ref 38.5–50.0)
HEMOGLOBIN: 15.4 g/dL (ref 13.2–17.1)
LYMPHS ABS: 2652 {cells}/uL (ref 850–3900)
LYMPHS PCT: 51 %
MCH: 30.9 pg (ref 27.0–33.0)
MCHC: 34.1 g/dL (ref 32.0–36.0)
MCV: 90.8 fL (ref 80.0–100.0)
MPV: 9.8 fL (ref 7.5–12.5)
Monocytes Absolute: 364 cells/uL (ref 200–950)
Monocytes Relative: 7 %
NEUTROS PCT: 40 %
Neutro Abs: 2080 cells/uL (ref 1500–7800)
Platelets: 182 10*3/uL (ref 140–400)
RBC: 4.98 MIL/uL (ref 4.20–5.80)
RDW: 13.7 % (ref 11.0–15.0)
WBC: 5.2 10*3/uL (ref 3.8–10.8)

## 2016-11-21 LAB — LIPASE: LIPASE: 116 U/L — AB (ref 7–60)

## 2016-11-21 LAB — AMYLASE: Amylase: 108 U/L — ABNORMAL HIGH (ref 21–101)

## 2016-11-21 LAB — COMPREHENSIVE METABOLIC PANEL
ALBUMIN: 4.2 g/dL (ref 3.6–5.1)
ALT: 11 U/L (ref 9–46)
AST: 20 U/L (ref 10–35)
Alkaline Phosphatase: 73 U/L (ref 40–115)
BUN: 15 mg/dL (ref 7–25)
CALCIUM: 9.3 mg/dL (ref 8.6–10.3)
CHLORIDE: 102 mmol/L (ref 98–110)
CO2: 27 mmol/L (ref 20–31)
CREATININE: 1.22 mg/dL (ref 0.70–1.33)
Glucose, Bld: 83 mg/dL (ref 65–99)
Potassium: 4 mmol/L (ref 3.5–5.3)
SODIUM: 138 mmol/L (ref 135–146)
Total Bilirubin: 0.3 mg/dL (ref 0.2–1.2)
Total Protein: 6.8 g/dL (ref 6.1–8.1)

## 2016-11-21 LAB — HIV ANTIBODY (ROUTINE TESTING W REFLEX): HIV: NONREACTIVE

## 2016-11-21 LAB — PHOSPHORUS: PHOSPHORUS: 3 mg/dL (ref 2.5–4.5)

## 2016-11-21 LAB — CK: Total CK: 123 U/L (ref 44–196)

## 2016-11-21 NOTE — Progress Notes (Signed)
Study: A Phase 2b/3 Double Blind Safety and Efficacy Study of Injectable Cabotegravir compared to Daily Oral Tenofovir Disoproxil Fumarate/Emtricitabine (TDF/FTC), For Pre-Exposure Prophylaxis in HIV-Uninfected Cisgender Men and Transgender Women who have sex with Men.  Medication: Investigational Injectable Cabotegravir/placebo compared to Truvada/placebo. Duration: Around 4 years.  Mason Morales is here for week 2 visit. No new complaints or concerns verbalized. His adherence has been 100%. Rapid HIV non-reactive. Declined condoms/lube today. Next visit scheduled for 12/05/16 at 9:00am.

## 2016-11-28 ENCOUNTER — Ambulatory Visit: Payer: 59 | Admitting: Internal Medicine

## 2016-12-05 ENCOUNTER — Encounter (INDEPENDENT_AMBULATORY_CARE_PROVIDER_SITE_OTHER): Payer: Self-pay | Admitting: *Deleted

## 2016-12-05 VITALS — BP 150/87 | HR 60 | Temp 97.3°F | Wt 167.0 lb

## 2016-12-05 DIAGNOSIS — Z006 Encounter for examination for normal comparison and control in clinical research program: Secondary | ICD-10-CM

## 2016-12-05 LAB — CBC WITH DIFFERENTIAL/PLATELET
BASOS PCT: 0 %
Basophils Absolute: 0 cells/uL (ref 0–200)
EOS ABS: 90 {cells}/uL (ref 15–500)
Eosinophils Relative: 2 %
HCT: 43.2 % (ref 38.5–50.0)
HEMOGLOBIN: 14.6 g/dL (ref 13.2–17.1)
LYMPHS PCT: 55 %
Lymphs Abs: 2475 cells/uL (ref 850–3900)
MCH: 30.3 pg (ref 27.0–33.0)
MCHC: 33.8 g/dL (ref 32.0–36.0)
MCV: 89.6 fL (ref 80.0–100.0)
MONO ABS: 270 {cells}/uL (ref 200–950)
MPV: 9.3 fL (ref 7.5–12.5)
Monocytes Relative: 6 %
NEUTROS PCT: 37 %
Neutro Abs: 1665 cells/uL (ref 1500–7800)
Platelets: 191 10*3/uL (ref 140–400)
RBC: 4.82 MIL/uL (ref 4.20–5.80)
RDW: 13.7 % (ref 11.0–15.0)
WBC: 4.5 10*3/uL (ref 3.8–10.8)

## 2016-12-05 LAB — COMPREHENSIVE METABOLIC PANEL
ALBUMIN: 4 g/dL (ref 3.6–5.1)
ALK PHOS: 69 U/L (ref 40–115)
ALT: 10 U/L (ref 9–46)
AST: 18 U/L (ref 10–35)
BILIRUBIN TOTAL: 0.3 mg/dL (ref 0.2–1.2)
BUN: 15 mg/dL (ref 7–25)
CO2: 27 mmol/L (ref 20–32)
CREATININE: 1.13 mg/dL (ref 0.70–1.33)
Calcium: 9 mg/dL (ref 8.6–10.3)
Chloride: 102 mmol/L (ref 98–110)
GLUCOSE: 92 mg/dL (ref 65–99)
Potassium: 3.8 mmol/L (ref 3.5–5.3)
SODIUM: 137 mmol/L (ref 135–146)
Total Protein: 6.5 g/dL (ref 6.1–8.1)

## 2016-12-05 LAB — PHOSPHORUS: Phosphorus: 2.9 mg/dL (ref 2.5–4.5)

## 2016-12-05 LAB — AMYLASE: Amylase: 71 U/L (ref 21–101)

## 2016-12-05 LAB — CK: Total CK: 117 U/L (ref 44–196)

## 2016-12-05 LAB — LIPASE: LIPASE: 60 U/L (ref 7–60)

## 2016-12-05 LAB — HIV ANTIBODY (ROUTINE TESTING W REFLEX): HIV 1&2 Ab, 4th Generation: NONREACTIVE

## 2016-12-05 NOTE — Progress Notes (Signed)
Study: A Phase 2b/3 Double Blind Safety and Efficacy Study of Injectable Cabotegravir compared to Daily Oral Tenofovir Disoproxil Fumarate/Emtricitabine (TDF/FTC), For Pre-Exposure Prophylaxis in HIV-Uninfected Cisgender Men and Transgender Women who have sex with Men.  Medication: Investigational Injectable Cabotegravir/placebo compared to Truvada/placebo. Duration: Around 4 years.  Mason Morales is here for week 4 visit. Complaining of (R) sided lower back pain which started a few days ago. Plans to follow up with his PCP if discomfort continues or increases. No other complaints or concerns verbalized. Excellent adherence with study medication. Rapid HIV non-reactive. Will return next week for his first injection on study.

## 2016-12-12 ENCOUNTER — Encounter (INDEPENDENT_AMBULATORY_CARE_PROVIDER_SITE_OTHER): Payer: Self-pay | Admitting: *Deleted

## 2016-12-12 VITALS — BP 124/71 | HR 61 | Temp 97.5°F | Wt 168.2 lb

## 2016-12-12 DIAGNOSIS — Z006 Encounter for examination for normal comparison and control in clinical research program: Secondary | ICD-10-CM

## 2016-12-12 NOTE — Progress Notes (Signed)
Mason Morales is here for his week 5 visit for Study: A Phase 2b/3 Double Blind Safety and Efficacy Study of Injectable Cabotegravir compared to Daily Oral Tenofovir Disoproxil Fumarate/Emtricitabine (TDF/FTC), For Pre-Exposure Prophylaxis in HIV-Uninfected Cisgender Men and Transgender Women who have sex with Men.  Medication: Investigational Injectable Cabotegravir/placebo compared to Truvada/placebo. Duration: Around 4 years.  He has been having nasal drainage and sinus pressure past week and feels like he has a sinus infection. No fever or body aches. He took zyrtec last night but hasn't seen it help any and will probably call his MD today about it. I did tell him it was okay to take OTC cold or sinus medication. He received his first study med injection in his rt buttock today without problem. He was instructed to call for significant injection site pain or other problems related to it. He returned all his meds and will only resume the Truvada/placebo daily now. He took his last oral study meds today at 8am. He will be returning next week for followup.

## 2016-12-13 LAB — HIV ANTIBODY (ROUTINE TESTING W REFLEX): HIV: NONREACTIVE

## 2016-12-14 ENCOUNTER — Other Ambulatory Visit (INDEPENDENT_AMBULATORY_CARE_PROVIDER_SITE_OTHER): Payer: 59

## 2016-12-14 ENCOUNTER — Encounter: Payer: Self-pay | Admitting: Internal Medicine

## 2016-12-14 ENCOUNTER — Ambulatory Visit (INDEPENDENT_AMBULATORY_CARE_PROVIDER_SITE_OTHER): Payer: 59 | Admitting: Internal Medicine

## 2016-12-14 VITALS — BP 144/82 | HR 71 | Temp 97.7°F | Resp 16 | Ht 69.0 in | Wt 167.0 lb

## 2016-12-14 DIAGNOSIS — Z23 Encounter for immunization: Secondary | ICD-10-CM

## 2016-12-14 DIAGNOSIS — J309 Allergic rhinitis, unspecified: Secondary | ICD-10-CM

## 2016-12-14 DIAGNOSIS — Z0001 Encounter for general adult medical examination with abnormal findings: Secondary | ICD-10-CM

## 2016-12-14 DIAGNOSIS — I10 Essential (primary) hypertension: Secondary | ICD-10-CM | POA: Diagnosis not present

## 2016-12-14 DIAGNOSIS — J019 Acute sinusitis, unspecified: Secondary | ICD-10-CM | POA: Insufficient documentation

## 2016-12-14 DIAGNOSIS — F411 Generalized anxiety disorder: Secondary | ICD-10-CM | POA: Diagnosis not present

## 2016-12-14 DIAGNOSIS — R350 Frequency of micturition: Secondary | ICD-10-CM

## 2016-12-14 DIAGNOSIS — R102 Pelvic and perineal pain: Secondary | ICD-10-CM

## 2016-12-14 LAB — LIPID PANEL
CHOL/HDL RATIO: 3
CHOLESTEROL: 140 mg/dL (ref 0–200)
HDL: 43.4 mg/dL (ref >39.00)
LDL CALC: 75 mg/dL (ref 0–99)
NonHDL: 96.34
TRIGLYCERIDES: 108 mg/dL (ref 0.0–149.0)
VLDL: 21.6 mg/dL (ref 0.0–40.0)

## 2016-12-14 LAB — BASIC METABOLIC PANEL
BUN: 13 mg/dL (ref 6–23)
CALCIUM: 9.6 mg/dL (ref 8.4–10.5)
CO2: 32 mEq/L (ref 19–32)
CREATININE: 1 mg/dL (ref 0.40–1.50)
Chloride: 102 mEq/L (ref 96–112)
GFR: 98.31 mL/min (ref >60.00)
GLUCOSE: 85 mg/dL (ref 70–99)
POTASSIUM: 4.2 meq/L (ref 3.5–5.1)
Sodium: 140 mEq/L (ref 135–145)

## 2016-12-14 LAB — URINALYSIS, ROUTINE W REFLEX MICROSCOPIC
Bilirubin Urine: NEGATIVE
KETONES UR: NEGATIVE
LEUKOCYTES UA: NEGATIVE
NITRITE: NEGATIVE
Specific Gravity, Urine: 1.015 (ref 1.000–1.030)
Total Protein, Urine: NEGATIVE
URINE GLUCOSE: NEGATIVE
Urobilinogen, UA: 0.2 (ref 0.0–1.0)
WBC UA: NONE SEEN (ref 0–?)
pH: 6.5 (ref 5.0–8.0)

## 2016-12-14 LAB — HEPATIC FUNCTION PANEL
ALBUMIN: 4.4 g/dL (ref 3.5–5.2)
ALT: 12 U/L (ref 0–53)
AST: 18 U/L (ref 0–37)
Alkaline Phosphatase: 73 U/L (ref 39–117)
BILIRUBIN TOTAL: 0.4 mg/dL (ref 0.2–1.2)
Bilirubin, Direct: 0.1 mg/dL (ref 0.0–0.3)
Total Protein: 7.2 g/dL (ref 6.0–8.3)

## 2016-12-14 LAB — CBC WITH DIFFERENTIAL/PLATELET
Basophils Absolute: 0 10*3/uL (ref 0.0–0.1)
Basophils Relative: 0.8 % (ref 0.0–3.0)
EOS ABS: 0.2 10*3/uL (ref 0.0–0.7)
EOS PCT: 3.8 % (ref 0.0–5.0)
HEMATOCRIT: 44.6 % (ref 39.0–52.0)
HEMOGLOBIN: 14.7 g/dL (ref 13.0–17.0)
LYMPHS PCT: 38.6 % (ref 12.0–46.0)
Lymphs Abs: 2.1 10*3/uL (ref 0.7–4.0)
MCHC: 32.9 g/dL (ref 30.0–36.0)
MCV: 93.1 fl (ref 78.0–100.0)
MONO ABS: 0.4 10*3/uL (ref 0.1–1.0)
Monocytes Relative: 7.3 % (ref 3.0–12.0)
Neutro Abs: 2.7 10*3/uL (ref 1.4–7.7)
Neutrophils Relative %: 49.5 % (ref 43.0–77.0)
Platelets: 197 10*3/uL (ref 150.0–400.0)
RBC: 4.79 Mil/uL (ref 4.22–5.81)
RDW: 13.8 % (ref 11.5–15.5)
WBC: 5.4 10*3/uL (ref 4.0–10.5)

## 2016-12-14 LAB — PSA: PSA: 0.57 ng/mL (ref 0.10–4.00)

## 2016-12-14 LAB — TSH: TSH: 1.06 u[IU]/mL (ref 0.35–4.50)

## 2016-12-14 MED ORDER — LEVOFLOXACIN 500 MG PO TABS: 500.0000 mg | ORAL_TABLET | Freq: Every day | ORAL | 0 refills | Status: AC

## 2016-12-14 NOTE — Assessment & Plan Note (Signed)
Mild elevated, likely reactive, o/w stable overall by history and exam, recent data reviewed with pt, and pt to continue medical treatment as before,  to f/u any worsening symptoms or concerns BP Readings from Last 3 Encounters:  12/14/16 (!) 144/82  12/12/16 124/71  12/05/16 (!) 150/87

## 2016-12-14 NOTE — Assessment & Plan Note (Signed)
stable overall by history and exam, and pt to continue medical treatment as before,  to f/u any worsening symptoms or concerns 

## 2016-12-14 NOTE — Progress Notes (Signed)
Subjective:    Patient ID: Mason Morales, male    DOB: [birth year], 61 y.o.   MRN: [id number]  HPI  Here for wellness and f/u;  Overall doing ok;  Pt denies Chest pain, worsening SOB, DOE, wheezing, orthopnea, PND, worsening LE edema, palpitations, dizziness or syncope.  Pt denies neurological change such as new headache, facial or extremity weakness.  Pt denies polydipsia, polyuria, or low sugar symptoms. Pt states overall good compliance with treatment and medications, good tolerability, and has been trying to follow appropriate diet.  Pt denies worsening depressive symptoms, suicidal ideation or panic. No fever, night sweats, wt loss, loss of appetite, or other constitutional symptoms.  Pt states good ability with ADL's, has low fall risk, home safety reviewed and adequate, no other significant changes in hearing or vision, and only occasionally active with exercise. Also, incidentally  Here with 2-3 days acute onset fever, facial pain, pressure, headache, general weakness and malaise, and greenish d/c, with mild ST and cough  Does have several wks ongoing nasal allergy symptoms with clearish congestion, itch and sneezing, without fever, pain, ST, cough, swelling or wheezing. Past Medical History:  Diagnosis Date  . Allergic rhinitis, cause unspecified 10/26/2010  . Allergy   . DDD (degenerative disc disease), cervical 10/26/2010  . DDD (degenerative disc disease), lumbar 10/26/2010  . Depression   . Generalized headaches   . GERD (gastroesophageal reflux disease)   . HTN (hypertension) 10/26/2010  . Hypertension   . Kidney stones    Past Surgical History:  Procedure Laterality Date  . MOUTH SURGERY  at 61 yo after horse accident    reports that he quit smoking about 2 years ago. He has never used smokeless tobacco. He reports that he drinks about 3.6 oz of alcohol per week . He reports that he uses drugs, including Marijuana. family history includes Alcohol abuse in his other; Cancer in his  other and other; Diabetes in his other and other; Heart disease in his other; Hyperlipidemia in his other; Hypertension in his other; Stroke in his other and other. Allergies  Allergen Reactions  . Isovue [Iopamidol] Hives and Itching  . Sulfa Antibiotics Nausea And Vomiting   Current Outpatient Prescriptions on File Prior to Visit  Medication Sig Dispense Refill  . amLODipine (NORVASC) 10 MG tablet TAKE ONE TABLET BY MOUTH DAILY 30 tablet 11  . aspirin 81 MG EC tablet Take 1 tablet (81 mg total) by mouth daily. Swallow whole. 30 tablet 12  . citalopram (CELEXA) 20 MG tablet TAKE 1 TABLET BY MOUTH EVERY DAY 90 tablet 2  . emtricitabine-tenofovir (TRUVADA) 200-300 MG tablet Patient on [id number] study. This may be placebo. DO NOT FILL. This is a study provided medication. 30 tablet 11  . gabapentin (NEURONTIN) 100 MG capsule TAKE TWO CAPSULES BY MOUTH THREE TIMES A DAY 180 capsule 2  . Investigational - Study Medication Cabotegravir/placebo 30mg  PO Additional study details: Will switch to injectable at week 5 1 each PRN  . meloxicam (MOBIC) 15 MG tablet Take 1 tablet (15 mg total) by mouth daily. 30 tablet 4  . methocarbamol (ROBAXIN) 500 MG tablet Take 500 mg by mouth as needed for muscle spasms.    Marland Kitchen omeprazole (PRILOSEC) 20 MG capsule Take 1 capsule (20 mg total) by mouth daily as needed (for heartburn). 30 capsule 4  . traMADol (ULTRAM) 50 MG tablet Take 1 tablet (50 mg total) by mouth every 6 (six) hours as needed for severe pain. 15 tablet 0  . [  DISCONTINUED] pantoprazole (PROTONIX) 20 MG tablet Take 1 tablet (20 mg total) by mouth daily. (Patient not taking: Reported on 05/05/2015) 30 tablet 0   No current facility-administered medications on file prior to visit.    Review of Systems  Constitutional: Negative for other unusual diaphoresis or sweats HENT: Negative for ear discharge or swelling Eyes: Negative for other worsening visual disturbances Respiratory: Negative for stridor or  other swelling  Gastrointestinal: Negative for worsening distension or other blood Genitourinary: Negative for retention or other urinary change Musculoskeletal: Negative for other MSK pain or swelling Skin: Negative for color change or other new lesions Neurological: Negative for worsening tremors and other numbness  Psychiatric/Behavioral: Negative for worsening agitation or other fatigue All other system neg per pt    Objective:   Physical Exam BP (!) 144/82 (BP Location: Left Arm, Patient Position: Sitting, Cuff Size: Normal)   Pulse 71   Temp 97.7 F (36.5 C) (Oral)   Resp 16   Ht 5\' 9"  (1.753 m)   Wt 167 lb (75.8 kg)   SpO2 97%   BMI 24.66 kg/m  VS noted, mild ill Constitutional: Pt is oriented to person, place, and time. Appears well-developed and well-nourished, in no significant distress and comfortable Head: Normocephalic and atraumatic  Eyes: Conjunctivae and EOM are normal. Pupils are equal, round, and reactive to light Right Ear: External ear normal without discharge Left Ear: External ear normal without discharge Nose: Nose without discharge or deformity Bilat tm's with mild erythema.  Max sinus areas mild tender.  Pharynx with mild erythema, no exudate Mouth/Throat: Oropharynx is without other ulcerations and moist  Neck: Normal range of motion. Neck supple. No JVD present. No tracheal deviation present or significant neck LA or mass Cardiovascular: Normal rate, regular rhythm, normal heart sounds and intact distal pulses.   Pulmonary/Chest: WOB normal and breath sounds without rales or wheezing  Abdominal: Soft. Bowel sounds are normal. NT. No HSM  Musculoskeletal: Normal range of motion. Exhibits no edema Lymphadenopathy: Has no other cervical adenopathy.  Neurological: Pt is alert and oriented to person, place, and time. Pt has normal reflexes. No cranial nerve deficit. Motor grossly intact, Gait intact Skin: Skin is warm and dry. No rash noted or new  ulcerations Psychiatric:  Has mild nervous mood and affect. Behavior is normal without agitation, not depressed affect No other exam findings  Lab Results  Component Value Date   WBC 4.5 12/05/2016   HGB 14.6 12/05/2016   HCT 43.2 12/05/2016   PLT 191 12/05/2016   GLUCOSE 92 12/05/2016   CHOL 168 11/08/2016   TRIG 119 11/08/2016   HDL 53 11/08/2016   LDLDIRECT 89.0 12/30/2015   LDLCALC 91 11/08/2016   ALT 10 12/05/2016   AST 18 12/05/2016   NA 137 12/05/2016   K 3.8 12/05/2016   CL 102 12/05/2016   CREATININE 1.13 12/05/2016   BUN 15 12/05/2016   CO2 27 12/05/2016   TSH 0.84 12/30/2015   PSA 0.66 12/30/2015       Assessment & Plan:

## 2016-12-14 NOTE — Assessment & Plan Note (Addendum)
Mild to mod, for antibx course,  to f/u any worsening symptoms or concerns  In addition to the time spent performing CPE, I spent an additional 15 minutes face to face,in which greater than 50% of this time was spent in counseling and coordination of care for patient's illness as documented, including the differential dx, tx, further evaluation and management of acute sinus infection, allergic rhinitis, HTN, and anxiety

## 2016-12-14 NOTE — Patient Instructions (Addendum)
You had the flu shot today., and the Tdap tetanus shot  Please take all new medication as prescribed  - the antibiotic  You can also take Delsym OTC for cough, and/or Mucinex (or it's generic off brand) for congestion, and tylenol as needed for pain.  Please continue all other medications as before, and refills have been done if requested.  Please have the pharmacy call with any other refills you may need.  Please continue your efforts at being more active, low cholesterol diet, and weight control.  You are otherwise up to date with prevention measures today.  Please keep your appointments with your specialists as you may have planned  Please go to the LAB in the Basement (turn left off the elevator) for the tests to be done today  You will be contacted by phone if any changes need to be made immediately.  Otherwise, you will receive a letter about your results with an explanation, but please check with MyChart first.  Please remember to sign up for MyChart if you have not done so, as this will be important to you in the future with finding out test results, communicating by private email, and scheduling acute appointments online when needed.  Please return in 1 year for your yearly visit, or sooner if needed, with Lab testing done 3-5 days before

## 2016-12-14 NOTE — Assessment & Plan Note (Signed)
Ok for otc allegra prn,  to f/u any worsening symptoms or concerns 

## 2016-12-14 NOTE — Assessment & Plan Note (Signed)

## 2016-12-15 LAB — URINE CULTURE: Organism ID, Bacteria: NO GROWTH

## 2016-12-20 ENCOUNTER — Encounter (INDEPENDENT_AMBULATORY_CARE_PROVIDER_SITE_OTHER): Payer: 59 | Admitting: *Deleted

## 2016-12-20 VITALS — BP 117/62 | HR 66 | Temp 98.3°F | Wt 165.5 lb

## 2016-12-20 DIAGNOSIS — Z006 Encounter for examination for normal comparison and control in clinical research program: Secondary | ICD-10-CM

## 2016-12-20 LAB — CBC WITH DIFFERENTIAL/PLATELET
BASOS PCT: 1 %
Basophils Absolute: 42 cells/uL (ref 0–200)
EOS PCT: 2 %
Eosinophils Absolute: 84 cells/uL (ref 15–500)
HCT: 42 % (ref 38.5–50.0)
HEMOGLOBIN: 13.8 g/dL (ref 13.2–17.1)
LYMPHS ABS: 1932 {cells}/uL (ref 850–3900)
Lymphocytes Relative: 46 %
MCH: 29.9 pg (ref 27.0–33.0)
MCHC: 32.9 g/dL (ref 32.0–36.0)
MCV: 90.9 fL (ref 80.0–100.0)
MONOS PCT: 9 %
MPV: 9.6 fL (ref 7.5–12.5)
Monocytes Absolute: 378 cells/uL (ref 200–950)
NEUTROS ABS: 1764 {cells}/uL (ref 1500–7800)
Neutrophils Relative %: 42 %
PLATELETS: 212 10*3/uL (ref 140–400)
RBC: 4.62 MIL/uL (ref 4.20–5.80)
RDW: 14.1 % (ref 11.0–15.0)
WBC: 4.2 10*3/uL (ref 3.8–10.8)

## 2016-12-20 NOTE — Progress Notes (Signed)
Study: A Phase 2b/3 Double Blind Safety and Efficacy Study of Injectable Cabotegravir compared to Daily Oral Tenofovir Disoproxil Fumarate/Emtricitabine (TDF/FTC), For Pre-Exposure Prophylaxis in HIV-Uninfected Cisgender Men and Transgender Women who have sex with Men.  Medication: Investigational Injectable Cabotegravir/placebo compared to Truvada/placebo. Duration: Around 4 years.  Mason Morales is here for week 6. E denies any injection site reactions. He was seen for a sinus infection and treated. All symptoms except for slight congestion have resolved. He has about 5 more days of abx. HIV rapid confirmed non-reactive. He received $50 gift card for visit. Next appt scheduled for 9/20.

## 2016-12-21 LAB — CK: Total CK: 121 U/L (ref 44–196)

## 2016-12-21 LAB — COMPREHENSIVE METABOLIC PANEL WITH GFR
ALT: 9 U/L (ref 9–46)
AST: 17 U/L (ref 10–35)
Albumin: 4.2 g/dL (ref 3.6–5.1)
Alkaline Phosphatase: 71 U/L (ref 40–115)
BUN: 18 mg/dL (ref 7–25)
CO2: 21 mmol/L (ref 20–32)
Calcium: 9.1 mg/dL (ref 8.6–10.3)
Chloride: 104 mmol/L (ref 98–110)
Creat: 1.29 mg/dL (ref 0.70–1.33)
Glucose, Bld: 91 mg/dL (ref 65–99)
Potassium: 3.5 mmol/L (ref 3.5–5.3)
Sodium: 138 mmol/L (ref 135–146)
Total Bilirubin: 0.5 mg/dL (ref 0.2–1.2)
Total Protein: 6.8 g/dL (ref 6.1–8.1)

## 2016-12-21 LAB — LIPASE: Lipase: 29 U/L (ref 7–60)

## 2016-12-21 LAB — HIV ANTIBODY (ROUTINE TESTING W REFLEX): HIV 1&2 Ab, 4th Generation: NONREACTIVE

## 2016-12-21 LAB — AMYLASE: AMYLASE: 59 U/L (ref 21–101)

## 2016-12-21 LAB — PHOSPHORUS: Phosphorus: 2.3 mg/dL — ABNORMAL LOW (ref 2.5–4.5)

## 2016-12-31 ENCOUNTER — Other Ambulatory Visit: Payer: Self-pay | Admitting: Internal Medicine

## 2016-12-31 NOTE — Telephone Encounter (Signed)
This is not normally a refillable chronic medication  Please consider ROV

## 2016-12-31 NOTE — Telephone Encounter (Signed)
Is this refill appropriate? Please advise 

## 2017-01-10 ENCOUNTER — Encounter (INDEPENDENT_AMBULATORY_CARE_PROVIDER_SITE_OTHER): Payer: 59 | Admitting: *Deleted

## 2017-01-10 VITALS — BP 139/86 | HR 60 | Temp 97.9°F | Wt 164.0 lb

## 2017-01-10 DIAGNOSIS — Z006 Encounter for examination for normal comparison and control in clinical research program: Secondary | ICD-10-CM

## 2017-01-11 LAB — PHOSPHORUS: Phosphorus: 3.2 mg/dL (ref 2.5–4.5)

## 2017-01-11 LAB — AMYLASE: AMYLASE: 62 U/L (ref 21–101)

## 2017-01-11 LAB — CBC WITH DIFFERENTIAL/PLATELET
Basophils Absolute: 19 cells/uL (ref 0–200)
Basophils Relative: 0.5 %
Eosinophils Absolute: 80 cells/uL (ref 15–500)
Eosinophils Relative: 2.1 %
HCT: 40.3 % (ref 38.5–50.0)
Hemoglobin: 13.8 g/dL (ref 13.2–17.1)
Lymphs Abs: 1930 cells/uL (ref 850–3900)
MCH: 30.4 pg (ref 27.0–33.0)
MCHC: 34.2 g/dL (ref 32.0–36.0)
MCV: 88.8 fL (ref 80.0–100.0)
MPV: 10.1 fL (ref 7.5–12.5)
Monocytes Relative: 8.7 %
NEUTROS ABS: 1440 {cells}/uL — AB (ref 1500–7800)
Neutrophils Relative %: 37.9 %
PLATELETS: 194 10*3/uL (ref 140–400)
RBC: 4.54 10*6/uL (ref 4.20–5.80)
RDW: 13.2 % (ref 11.0–15.0)
Total Lymphocyte: 50.8 %
WBC mixed population: 331 cells/uL (ref 200–950)
WBC: 3.8 10*3/uL (ref 3.8–10.8)

## 2017-01-11 LAB — COMPREHENSIVE METABOLIC PANEL
AG Ratio: 1.6 (calc) (ref 1.0–2.5)
ALT: 8 U/L — AB (ref 9–46)
AST: 16 U/L (ref 10–35)
Albumin: 4.1 g/dL (ref 3.6–5.1)
Alkaline phosphatase (APISO): 71 U/L (ref 40–115)
BUN: 18 mg/dL (ref 7–25)
CALCIUM: 9.1 mg/dL (ref 8.6–10.3)
CO2: 25 mmol/L (ref 20–32)
Chloride: 104 mmol/L (ref 98–110)
Creat: 1.03 mg/dL (ref 0.70–1.33)
GLUCOSE: 96 mg/dL (ref 65–99)
Globulin: 2.5 g/dL (calc) (ref 1.9–3.7)
Potassium: 3.5 mmol/L (ref 3.5–5.3)
SODIUM: 136 mmol/L (ref 135–146)
TOTAL PROTEIN: 6.6 g/dL (ref 6.1–8.1)
Total Bilirubin: 0.4 mg/dL (ref 0.2–1.2)

## 2017-01-11 LAB — LIPASE: LIPASE: 40 U/L (ref 7–60)

## 2017-01-11 LAB — CK: Total CK: 109 U/L (ref 44–196)

## 2017-01-11 LAB — HIV ANTIBODY (ROUTINE TESTING W REFLEX): HIV: NONREACTIVE

## 2017-01-15 NOTE — Progress Notes (Signed)
Study: A Phase 2b/3 Double Blind Safety and Efficacy Study of Injectable Cabotegravir compared to Daily Oral Tenofovir Disoproxil Fumarate/Emtricitabine (TDF/FTC), For Pre-Exposure Prophylaxis in HIV-Uninfected Cisgender Men and Transgender Women who have sex with Men.  Medication: Investigational Injectable Cabotegravir/placebo compared to Truvada/placebo. Duration: Around 4 years.  Mason Morales is here for week 9.  After verifying the correct version I explained/reviewed version 2.0 informed consent in the language that he understood. Risk, benefits, responsibilities, and other options were reviewed. I answered his questions. Comprehension was assessed. He was given adequate time to consider his options. He verbalized understanding and signed the consent witnessed by me. I gave him a copy of his consent. HIV rapid confirmed non-reactive. He denies any new issues and assessment is unchanged. He returned study IP. Injection given in left gluteal muscle with no problems. Oral study IP was dispensed. He received $50 gift card for visit. NExt appointment scheduled for 9/27.

## 2017-01-17 ENCOUNTER — Encounter (INDEPENDENT_AMBULATORY_CARE_PROVIDER_SITE_OTHER): Payer: Self-pay | Admitting: *Deleted

## 2017-01-17 VITALS — BP 148/79 | HR 56 | Temp 97.3°F | Wt 167.5 lb

## 2017-01-17 DIAGNOSIS — Z006 Encounter for examination for normal comparison and control in clinical research program: Secondary | ICD-10-CM

## 2017-01-17 NOTE — Progress Notes (Signed)
Study: A Phase 2b/3 Double Blind Safety and Efficacy Study of Injectable Cabotegravir compared to Daily Oral Tenofovir Disoproxil Fumarate/Emtricitabine (TDF/FTC), For Pre-Exposure Prophylaxis in HIV-Uninfected Cisgender Men and Transgender Women who have sex with Men.  Medication: Investigational Injectable Cabotegravir/placebo compared to Truvada/placebo. Duration: Around 4 years.  Mason Morales is here for week 10 visit. Denies any problem after his last injection. States excellent adherence with his oral study medication. Rapid HIV non-reactive. No new complaints or concerns. Next visit scheduled for 03/06/17 at 9:00am.

## 2017-01-18 LAB — COMPREHENSIVE METABOLIC PANEL
AG Ratio: 1.6 (calc) (ref 1.0–2.5)
ALBUMIN MSPROF: 4.1 g/dL (ref 3.6–5.1)
ALT: 9 U/L (ref 9–46)
AST: 17 U/L (ref 10–35)
Alkaline phosphatase (APISO): 73 U/L (ref 40–115)
BILIRUBIN TOTAL: 0.3 mg/dL (ref 0.2–1.2)
BUN: 12 mg/dL (ref 7–25)
CO2: 28 mmol/L (ref 20–32)
CREATININE: 1.16 mg/dL (ref 0.70–1.33)
Calcium: 9 mg/dL (ref 8.6–10.3)
Chloride: 103 mmol/L (ref 98–110)
Globulin: 2.6 g/dL (calc) (ref 1.9–3.7)
Glucose, Bld: 103 mg/dL — ABNORMAL HIGH (ref 65–99)
POTASSIUM: 4.2 mmol/L (ref 3.5–5.3)
SODIUM: 140 mmol/L (ref 135–146)
TOTAL PROTEIN: 6.7 g/dL (ref 6.1–8.1)

## 2017-01-18 LAB — CBC WITH DIFFERENTIAL/PLATELET
Basophils Absolute: 19 cells/uL (ref 0–200)
Basophils Relative: 0.4 %
EOS PCT: 1.7 %
Eosinophils Absolute: 82 cells/uL (ref 15–500)
HCT: 42.2 % (ref 38.5–50.0)
Hemoglobin: 14.2 g/dL (ref 13.2–17.1)
Lymphs Abs: 2395 cells/uL (ref 850–3900)
MCH: 30 pg (ref 27.0–33.0)
MCHC: 33.6 g/dL (ref 32.0–36.0)
MCV: 89.2 fL (ref 80.0–100.0)
MONOS PCT: 6.9 %
MPV: 10 fL (ref 7.5–12.5)
NEUTROS PCT: 41.1 %
Neutro Abs: 1973 cells/uL (ref 1500–7800)
Platelets: 206 10*3/uL (ref 140–400)
RBC: 4.73 10*6/uL (ref 4.20–5.80)
RDW: 12.8 % (ref 11.0–15.0)
TOTAL LYMPHOCYTE: 49.9 %
WBC mixed population: 331 cells/uL (ref 200–950)
WBC: 4.8 10*3/uL (ref 3.8–10.8)

## 2017-01-18 LAB — AMYLASE: Amylase: 70 U/L (ref 21–101)

## 2017-01-18 LAB — LIPASE: Lipase: 43 U/L (ref 7–60)

## 2017-01-18 LAB — CK: CK TOTAL: 101 U/L (ref 44–196)

## 2017-01-18 LAB — PHOSPHORUS: Phosphorus: 2.8 mg/dL (ref 2.5–4.5)

## 2017-01-18 LAB — HIV ANTIBODY (ROUTINE TESTING W REFLEX): HIV: NONREACTIVE

## 2017-01-24 ENCOUNTER — Other Ambulatory Visit: Payer: Self-pay | Admitting: Internal Medicine

## 2017-02-16 ENCOUNTER — Other Ambulatory Visit: Payer: Self-pay | Admitting: Internal Medicine

## 2017-03-06 ENCOUNTER — Encounter (INDEPENDENT_AMBULATORY_CARE_PROVIDER_SITE_OTHER): Payer: Self-pay | Admitting: *Deleted

## 2017-03-06 VITALS — BP 111/63 | HR 73 | Temp 98.3°F | Wt 174.0 lb

## 2017-03-06 DIAGNOSIS — Z006 Encounter for examination for normal comparison and control in clinical research program: Secondary | ICD-10-CM

## 2017-03-06 NOTE — Progress Notes (Signed)
Study: A Phase 2b/3 Double Blind Safety and Efficacy Study of Injectable Cabotegravir compared to Daily Oral Tenofovir Disoproxil Fumarate/Emtricitabine (TDF/FTC), For Pre-Exposure Prophylaxis in HIV-Uninfected Cisgender Men and Transgender Women who have sex with Men.  Medication: Investigational Injectable Cabotegravir/placebo compared to Truvada/placebo. Duration: Around 4 years.  Mason Morales is here for week 17. He received a cortisone injection (02/12/17) for his chronic DDD. He denies any issues and/or changes. Assessment revealed no new issues. HIV rapid confirmed non-reactive. Questionnaires completed. He returned #35 pills of oral study product. Injection given in right gluteal muscle with no problems. He received $50 gift card for visit. Next appointment scheduled for 11/28.

## 2017-03-07 LAB — CBC WITH DIFFERENTIAL/PLATELET
BASOS PCT: 0.8 %
Basophils Absolute: 39 cells/uL (ref 0–200)
EOS ABS: 88 {cells}/uL (ref 15–500)
Eosinophils Relative: 1.8 %
HCT: 37.4 % — ABNORMAL LOW (ref 38.5–50.0)
HEMOGLOBIN: 13.1 g/dL — AB (ref 13.2–17.1)
Lymphs Abs: 2244 cells/uL (ref 850–3900)
MCH: 31.5 pg (ref 27.0–33.0)
MCHC: 35 g/dL (ref 32.0–36.0)
MCV: 89.9 fL (ref 80.0–100.0)
MPV: 10.1 fL (ref 7.5–12.5)
Monocytes Relative: 5.7 %
NEUTROS ABS: 2249 {cells}/uL (ref 1500–7800)
Neutrophils Relative %: 45.9 %
Platelets: 198 10*3/uL (ref 140–400)
RBC: 4.16 10*6/uL — ABNORMAL LOW (ref 4.20–5.80)
RDW: 13 % (ref 11.0–15.0)
Total Lymphocyte: 45.8 %
WBC: 4.9 10*3/uL (ref 3.8–10.8)
WBCMIX: 279 {cells}/uL (ref 200–950)

## 2017-03-07 LAB — HIV ANTIBODY (ROUTINE TESTING W REFLEX): HIV 1&2 Ab, 4th Generation: NONREACTIVE

## 2017-03-07 LAB — AMYLASE: Amylase: 79 U/L (ref 21–101)

## 2017-03-07 LAB — PHOSPHORUS: Phosphorus: 3 mg/dL (ref 2.5–4.5)

## 2017-03-07 LAB — COMPREHENSIVE METABOLIC PANEL
AG Ratio: 1.7 (calc) (ref 1.0–2.5)
ALBUMIN MSPROF: 3.9 g/dL (ref 3.6–5.1)
ALKALINE PHOSPHATASE (APISO): 72 U/L (ref 40–115)
ALT: 11 U/L (ref 9–46)
AST: 17 U/L (ref 10–35)
BILIRUBIN TOTAL: 0.3 mg/dL (ref 0.2–1.2)
BUN: 15 mg/dL (ref 7–25)
CHLORIDE: 105 mmol/L (ref 98–110)
CO2: 24 mmol/L (ref 20–32)
CREATININE: 1.2 mg/dL (ref 0.70–1.33)
Calcium: 8.9 mg/dL (ref 8.6–10.3)
GLOBULIN: 2.3 g/dL (ref 1.9–3.7)
Glucose, Bld: 129 mg/dL — ABNORMAL HIGH (ref 65–99)
POTASSIUM: 3.9 mmol/L (ref 3.5–5.3)
Sodium: 138 mmol/L (ref 135–146)
Total Protein: 6.2 g/dL (ref 6.1–8.1)

## 2017-03-07 LAB — CK: Total CK: 153 U/L (ref 44–196)

## 2017-03-07 LAB — LIPASE: LIPASE: 99 U/L — AB (ref 7–60)

## 2017-03-20 ENCOUNTER — Encounter (INDEPENDENT_AMBULATORY_CARE_PROVIDER_SITE_OTHER): Payer: Self-pay | Admitting: *Deleted

## 2017-03-20 VITALS — BP 121/76 | HR 65 | Temp 98.6°F | Wt 173.8 lb

## 2017-03-20 DIAGNOSIS — Z006 Encounter for examination for normal comparison and control in clinical research program: Secondary | ICD-10-CM

## 2017-03-20 NOTE — Progress Notes (Signed)
Study: A Phase 2b/3 Double Blind Safety and Efficacy Study of Injectable Cabotegravir compared to Daily Oral Tenofovir Disoproxil Fumarate/Emtricitabine (TDF/FTC), For Pre-Exposure Prophylaxis in HIV-Uninfected Cisgender Men and Transgender Women who have sex with Men.  Medication: Investigational Injectable Cabotegravir/placebo compared to Truvada/placebo. Duration: Around 4 years.  Mason ScrapeJohnnie is here for week 19. He denies any injection site reaction. He reports no changes since last study visit. HIV rapid confirmed non-reactive. Questionnaire completed. He received $50 gift card for visit. Next appointment scheduled for 05/02/17.

## 2017-03-21 LAB — CBC WITH DIFFERENTIAL/PLATELET
BASOS ABS: 21 {cells}/uL (ref 0–200)
BASOS PCT: 0.5 %
EOS PCT: 1.9 %
Eosinophils Absolute: 80 cells/uL (ref 15–500)
HCT: 39.8 % (ref 38.5–50.0)
HEMOGLOBIN: 13.7 g/dL (ref 13.2–17.1)
Lymphs Abs: 2050 cells/uL (ref 850–3900)
MCH: 30.8 pg (ref 27.0–33.0)
MCHC: 34.4 g/dL (ref 32.0–36.0)
MCV: 89.4 fL (ref 80.0–100.0)
MONOS PCT: 8.3 %
MPV: 9.6 fL (ref 7.5–12.5)
NEUTROS ABS: 1701 {cells}/uL (ref 1500–7800)
Neutrophils Relative %: 40.5 %
PLATELETS: 212 10*3/uL (ref 140–400)
RBC: 4.45 10*6/uL (ref 4.20–5.80)
RDW: 13 % (ref 11.0–15.0)
TOTAL LYMPHOCYTE: 48.8 %
WBC mixed population: 349 cells/uL (ref 200–950)
WBC: 4.2 10*3/uL (ref 3.8–10.8)

## 2017-03-21 LAB — COMPREHENSIVE METABOLIC PANEL
AG Ratio: 1.5 (calc) (ref 1.0–2.5)
ALKALINE PHOSPHATASE (APISO): 75 U/L (ref 40–115)
ALT: 10 U/L (ref 9–46)
AST: 19 U/L (ref 10–35)
Albumin: 4 g/dL (ref 3.6–5.1)
BUN: 22 mg/dL (ref 7–25)
CHLORIDE: 107 mmol/L (ref 98–110)
CO2: 22 mmol/L (ref 20–32)
CREATININE: 1.31 mg/dL (ref 0.70–1.33)
Calcium: 9.1 mg/dL (ref 8.6–10.3)
GLOBULIN: 2.7 g/dL (ref 1.9–3.7)
GLUCOSE: 94 mg/dL (ref 65–99)
Potassium: 3.7 mmol/L (ref 3.5–5.3)
Sodium: 138 mmol/L (ref 135–146)
Total Bilirubin: 0.4 mg/dL (ref 0.2–1.2)
Total Protein: 6.7 g/dL (ref 6.1–8.1)

## 2017-03-21 LAB — PHOSPHORUS: PHOSPHORUS: 3.2 mg/dL (ref 2.5–4.5)

## 2017-03-21 LAB — HIV ANTIBODY (ROUTINE TESTING W REFLEX): HIV: NONREACTIVE

## 2017-03-21 LAB — LIPASE: LIPASE: 70 U/L — AB (ref 7–60)

## 2017-03-21 LAB — CK: CK TOTAL: 369 U/L — AB (ref 44–196)

## 2017-03-21 LAB — AMYLASE: AMYLASE: 71 U/L (ref 21–101)

## 2017-03-26 ENCOUNTER — Encounter: Payer: Self-pay | Admitting: Internal Medicine

## 2017-03-26 ENCOUNTER — Ambulatory Visit (INDEPENDENT_AMBULATORY_CARE_PROVIDER_SITE_OTHER): Payer: 59 | Admitting: Internal Medicine

## 2017-03-26 VITALS — BP 132/82 | HR 73 | Temp 98.1°F | Ht 69.0 in | Wt 172.0 lb

## 2017-03-26 DIAGNOSIS — M5412 Radiculopathy, cervical region: Secondary | ICD-10-CM | POA: Diagnosis not present

## 2017-03-26 DIAGNOSIS — M5416 Radiculopathy, lumbar region: Secondary | ICD-10-CM | POA: Insufficient documentation

## 2017-03-26 DIAGNOSIS — I10 Essential (primary) hypertension: Secondary | ICD-10-CM | POA: Diagnosis not present

## 2017-03-26 MED ORDER — TRAMADOL HCL 50 MG PO TABS: 50.0000 mg | ORAL_TABLET | Freq: Four times a day (QID) | ORAL | 0 refills | Status: DC | PRN

## 2017-03-26 MED ORDER — PREDNISONE 10 MG PO TABS: ORAL_TABLET | ORAL | 0 refills | Status: DC

## 2017-03-26 NOTE — Patient Instructions (Addendum)
Please take all new medication as prescribed - the tramadol and prednisone  Please continue all other medications as before, including the muscle relaxer and gabapentin  Please have the pharmacy call with any other refills you may need.  Please keep your appointments with your specialists as you may have planned  You will be contacted regarding the referral for: Lower back MRI (LS Spine MRI), and Dr Shon BatonBrooks

## 2017-03-26 NOTE — Progress Notes (Signed)
Subjective:    Patient ID: Mason Morales, male    DOB: 05/19/1955, 61 y.o.   MRN: [id number]  HPI  Here with c/o mod to severe worsening acute left LBP x 4 wks with radiation to the distal LLE with pain and numbness, slight gait change and leg dragging  No falls.  Nohting seems to make better or worse,.  Pt denies bowel or bladder change, fever, wt loss,  Pt denies chest pain, increased sob or doe, wheezing, orthopnea, PND, increased LE swelling, palpitations, dizziness or syncope.  Also recently had ESI to left neck per Dr Minna Merrittsamos/ortho which helped for a few days, still has numbness and some trace left arm weakness today.  Has longer hx of right sided radicular pain recently overall improved and not present today  Pt denies other new neurological symptoms such as new headache, or facial or extremity weakness or numbness.   Pt denies polydipsia, polyuria Past Medical History:  Diagnosis Date  . Allergic rhinitis, cause unspecified 10/26/2010  . Allergy   . DDD (degenerative disc disease), cervical 10/26/2010  . DDD (degenerative disc disease), lumbar 10/26/2010  . Depression   . Generalized headaches   . GERD (gastroesophageal reflux disease)   . HTN (hypertension) 10/26/2010  . Hypertension   . Kidney stones    Past Surgical History:  Procedure Laterality Date  . MOUTH SURGERY  at 61 yo after horse accident    reports that he quit smoking about 3 years ago. he has never used smokeless tobacco. He reports that he drinks about 3.6 oz of alcohol per week. He reports that he uses drugs. Drug: Marijuana. family history includes Alcohol abuse in his other; Cancer in his other and other; Diabetes in his other and other; Heart disease in his other; Hyperlipidemia in his other; Hypertension in his other; Stroke in his other and other. Allergies  Allergen Reactions  . Isovue [Iopamidol] Hives and Itching  . Sulfa Antibiotics Nausea And Vomiting   Current Outpatient Medications on File Prior to Visit    Medication Sig Dispense Refill  . amLODipine (NORVASC) 10 MG tablet TAKE ONE TABLET BY MOUTH DAILY 30 tablet 11  . aspirin 81 MG EC tablet Take 1 tablet (81 mg total) by mouth daily. Swallow whole. 30 tablet 12  . citalopram (CELEXA) 20 MG tablet TAKE 1 TABLET BY MOUTH EVERY DAY 90 tablet 2  . emtricitabine-tenofovir (TRUVADA) 200-300 MG tablet Patient on [id number] study. This may be placebo. DO NOT FILL. This is a study provided medication. 30 tablet 11  . gabapentin (NEURONTIN) 100 MG capsule TAKE TWO CAPSULES BY MOUTH THREE TIMES A DAY 180 capsule 3  . Investigational - Study Medication Cabotegravir/placebo 30mg  PO Additional study details: Will switch to injectable at week 5 1 each PRN  . meloxicam (MOBIC) 15 MG tablet TAKE ONE TABLET BY MOUTH DAILY 30 tablet 10  . methocarbamol (ROBAXIN) 500 MG tablet Take 500 mg by mouth as needed for muscle spasms.    Marland Kitchen. omeprazole (PRILOSEC) 20 MG capsule Take 1 capsule (20 mg total) by mouth daily as needed (for heartburn). 30 capsule 4  . [DISCONTINUED] pantoprazole (PROTONIX) 20 MG tablet Take 1 tablet (20 mg total) by mouth daily. (Patient not taking: Reported on 05/05/2015) 30 tablet 0   No current facility-administered medications on file prior to visit.    Review of Systems  Constitutional: Negative for other unusual diaphoresis or sweats HENT: Negative for ear discharge or swelling Eyes: Negative for other worsening  visual disturbances Respiratory: Negative for stridor or other swelling  Gastrointestinal: Negative for worsening distension or other blood Genitourinary: Negative for retention or other urinary change Musculoskeletal: Negative for other MSK pain or swelling Skin: Negative for color change or other new lesions Neurological: Negative for worsening tremors and other numbness  Psychiatric/Behavioral: Negative for worsening agitation or other fatigue All other system neg per pt    Objective:   Physical Exam BP 132/82   Pulse 73    Temp 98.1 F (36.7 C) (Oral)   Ht 5\' 9"  (1.753 m)   Wt 172 lb (78 kg)   SpO2 99%   BMI 25.40 kg/m  VS noted, not ill appaering Constitutional: Pt appears in NAD HENT: Head: NCAT.  Right Ear: External ear normal.  Left Ear: External ear normal.  Eyes: . Pupils are equal, round, and reactive to light. Conjunctivae and EOM are normal Nose: without d/c or deformity Neck: Neck supple. Gross normal ROM Cardiovascular: Normal rate and regular rhythm.   Pulmonary/Chest: Effort normal and breath sounds without rales or wheezing.  Neurological: Pt is alert. At baseline orientation, motor intact except LLE 4/5 and LUE trace distal weakness only, sens intact except decreased to distal LLE  Skin: Skin is warm. No rashes, other new lesions, no LE edema Psychiatric: Pt behavior is normal without agitation  No other exam findings    Assessment & Plan:

## 2017-03-27 ENCOUNTER — Telehealth: Payer: Self-pay

## 2017-03-27 NOTE — Telephone Encounter (Signed)
Approved 02/25/17-03/27/18  Determination sent to scan

## 2017-03-28 NOTE — Assessment & Plan Note (Signed)
stable overall by history and exam, recent data reviewed with pt, and pt to continue medical treatment as before,  to f/u any worsening symptoms or concerns BP Readings from Last 3 Encounters:  03/26/17 132/82  03/20/17 121/76  03/06/17 111/63

## 2017-03-28 NOTE — Assessment & Plan Note (Signed)
With very mild neuro change I suspect, will hold on imaging for now, but should also f/u ortho and tx as above

## 2017-03-28 NOTE — Assessment & Plan Note (Signed)
New worsening and persistent despite conservative management, + neuro change on exam, for tramaodl prn, predpac asd, MRI LS spine, continue gabapentin, will need ortho f/u post MRI

## 2017-04-04 ENCOUNTER — Other Ambulatory Visit: Payer: Self-pay | Admitting: Internal Medicine

## 2017-04-18 ENCOUNTER — Encounter: Payer: Self-pay | Admitting: Internal Medicine

## 2017-05-02 ENCOUNTER — Encounter: Payer: 59 | Admitting: *Deleted

## 2017-05-02 VITALS — BP 120/73 | HR 68 | Temp 98.3°F | Wt 172.0 lb

## 2017-05-02 DIAGNOSIS — Z006 Encounter for examination for normal comparison and control in clinical research program: Secondary | ICD-10-CM

## 2017-05-03 LAB — COMPREHENSIVE METABOLIC PANEL
AG Ratio: 1.6 (calc) (ref 1.0–2.5)
ALBUMIN MSPROF: 4.2 g/dL (ref 3.6–5.1)
ALT: 11 U/L (ref 9–46)
AST: 17 U/L (ref 10–35)
Alkaline phosphatase (APISO): 79 U/L (ref 40–115)
BUN: 19 mg/dL (ref 7–25)
CALCIUM: 8.7 mg/dL (ref 8.6–10.3)
CO2: 25 mmol/L (ref 20–32)
CREATININE: 0.99 mg/dL (ref 0.70–1.33)
Chloride: 106 mmol/L (ref 98–110)
GLUCOSE: 96 mg/dL (ref 65–99)
Globulin: 2.6 g/dL (calc) (ref 1.9–3.7)
Potassium: 3.8 mmol/L (ref 3.5–5.3)
SODIUM: 138 mmol/L (ref 135–146)
TOTAL PROTEIN: 6.8 g/dL (ref 6.1–8.1)
Total Bilirubin: 0.4 mg/dL (ref 0.2–1.2)

## 2017-05-03 LAB — CK: CK TOTAL: 140 U/L (ref 44–196)

## 2017-05-03 LAB — CBC WITH DIFFERENTIAL/PLATELET
BASOS PCT: 0.8 %
Basophils Absolute: 32 cells/uL (ref 0–200)
EOS PCT: 1.8 %
Eosinophils Absolute: 72 cells/uL (ref 15–500)
HCT: 41.7 % (ref 38.5–50.0)
Hemoglobin: 14.3 g/dL (ref 13.2–17.1)
Lymphs Abs: 2208 cells/uL (ref 850–3900)
MCH: 29.7 pg (ref 27.0–33.0)
MCHC: 34.3 g/dL (ref 32.0–36.0)
MCV: 86.7 fL (ref 80.0–100.0)
MPV: 10 fL (ref 7.5–12.5)
Monocytes Relative: 7.1 %
NEUTROS PCT: 35.1 %
Neutro Abs: 1404 cells/uL — ABNORMAL LOW (ref 1500–7800)
PLATELETS: 210 10*3/uL (ref 140–400)
RBC: 4.81 10*6/uL (ref 4.20–5.80)
RDW: 12.8 % (ref 11.0–15.0)
TOTAL LYMPHOCYTE: 55.2 %
WBC mixed population: 284 cells/uL (ref 200–950)
WBC: 4 10*3/uL (ref 3.8–10.8)

## 2017-05-03 LAB — LIPASE: LIPASE: 45 U/L (ref 7–60)

## 2017-05-03 LAB — PHOSPHORUS: Phosphorus: 2.5 mg/dL (ref 2.5–4.5)

## 2017-05-03 LAB — AMYLASE: AMYLASE: 65 U/L (ref 21–101)

## 2017-05-03 LAB — HIV ANTIBODY (ROUTINE TESTING W REFLEX): HIV 1&2 Ab, 4th Generation: NONREACTIVE

## 2017-05-16 ENCOUNTER — Encounter (INDEPENDENT_AMBULATORY_CARE_PROVIDER_SITE_OTHER): Payer: Self-pay | Admitting: *Deleted

## 2017-05-16 VITALS — BP 142/81 | HR 59 | Temp 97.5°F | Wt 171.5 lb

## 2017-05-16 DIAGNOSIS — Z006 Encounter for examination for normal comparison and control in clinical research program: Secondary | ICD-10-CM

## 2017-05-16 LAB — COMPREHENSIVE METABOLIC PANEL
AG Ratio: 1.4 (calc) (ref 1.0–2.5)
ALT: 10 U/L (ref 9–46)
AST: 18 U/L (ref 10–35)
Albumin: 4.2 g/dL (ref 3.6–5.1)
Alkaline phosphatase (APISO): 85 U/L (ref 40–115)
BILIRUBIN TOTAL: 0.4 mg/dL (ref 0.2–1.2)
BUN: 19 mg/dL (ref 7–25)
CALCIUM: 9.4 mg/dL (ref 8.6–10.3)
CO2: 25 mmol/L (ref 20–32)
Chloride: 105 mmol/L (ref 98–110)
Creat: 1.28 mg/dL (ref 0.70–1.33)
Globulin: 2.9 g/dL (calc) (ref 1.9–3.7)
Glucose, Bld: 97 mg/dL (ref 65–99)
Potassium: 4.1 mmol/L (ref 3.5–5.3)
SODIUM: 138 mmol/L (ref 135–146)
TOTAL PROTEIN: 7.1 g/dL (ref 6.1–8.1)

## 2017-05-16 LAB — CBC WITH DIFFERENTIAL/PLATELET
BASOS ABS: 32 {cells}/uL (ref 0–200)
Basophils Relative: 0.7 %
EOS PCT: 1.6 %
Eosinophils Absolute: 72 cells/uL (ref 15–500)
HEMATOCRIT: 43.3 % (ref 38.5–50.0)
Hemoglobin: 14.8 g/dL (ref 13.2–17.1)
LYMPHS ABS: 2700 {cells}/uL (ref 850–3900)
MCH: 30.1 pg (ref 27.0–33.0)
MCHC: 34.2 g/dL (ref 32.0–36.0)
MCV: 88 fL (ref 80.0–100.0)
MONOS PCT: 5.6 %
MPV: 9.8 fL (ref 7.5–12.5)
NEUTROS PCT: 32.1 %
Neutro Abs: 1445 cells/uL — ABNORMAL LOW (ref 1500–7800)
Platelets: 191 10*3/uL (ref 140–400)
RBC: 4.92 10*6/uL (ref 4.20–5.80)
RDW: 12.9 % (ref 11.0–15.0)
Total Lymphocyte: 60 %
WBC mixed population: 252 cells/uL (ref 200–950)
WBC: 4.5 10*3/uL (ref 3.8–10.8)

## 2017-05-16 LAB — LIPASE: Lipase: 37 U/L (ref 7–60)

## 2017-05-16 LAB — AMYLASE: AMYLASE: 63 U/L (ref 21–101)

## 2017-05-16 LAB — PHOSPHORUS: Phosphorus: 2.6 mg/dL (ref 2.5–4.5)

## 2017-05-16 LAB — CK: CK TOTAL: 114 U/L (ref 44–196)

## 2017-05-16 LAB — HIV ANTIBODY (ROUTINE TESTING W REFLEX): HIV: NONREACTIVE

## 2017-05-16 NOTE — Progress Notes (Signed)
Study: A Phase 2b/3 Double Blind Safety and Efficacy Study of Injectable Cabotegravir compared to Daily Oral Tenofovir Disoproxil Fumarate/Emtricitabine (TDF/FTC), For Pre-Exposure Prophylaxis in HIV-Uninfected Cisgender Men and Transgender Women who have sex with Men.  Medication: Investigational Injectable Cabotegravir/placebo compared to Truvada/placebo. Duration: Around 4 years.  Mason ScrapeJohnnie is here for week 27 visit. Denies any injection site reaction after his last injection. No new problems or medications. States that he has been very adherent with his oral study medication. Rapid HIV non-reactive. Next visit scheduled for 06/27/17 at 9:00am.

## 2017-06-27 ENCOUNTER — Encounter (INDEPENDENT_AMBULATORY_CARE_PROVIDER_SITE_OTHER): Payer: Self-pay | Admitting: *Deleted

## 2017-06-27 VITALS — BP 147/70 | HR 59 | Temp 98.0°F | Wt 167.2 lb

## 2017-06-27 DIAGNOSIS — Z006 Encounter for examination for normal comparison and control in clinical research program: Secondary | ICD-10-CM

## 2017-06-27 NOTE — Progress Notes (Signed)
Study: A Phase 2b/3 Double Blind Safety and Efficacy Study of Injectable Cabotegravir compared to Daily Oral Tenofovir Disoproxil Fumarate/Emtricitabine (TDF/FTC), For Pre-Exposure Prophylaxis in HIV-Uninfected Cisgender Men and Transgender Women who have sex with Men.  Medication: Investigational Injectable Cabotegravir/placebo compared to Truvada/placebo. Duration: Around 4 years.  Mason ScrapeJohnnie is here for week 33 visit. No new complaints or concerns. States that he feels he did miss a few doses of his oral study medication. Returned #39 pills making his adherence 92%. Rapid HIV non-reactive. Cabotegravir/placebo injection given (R) glute. Site unremarkable. He will return in 2 weeks for his next study visit.

## 2017-06-28 LAB — COMPREHENSIVE METABOLIC PANEL
AG Ratio: 1.4 (calc) (ref 1.0–2.5)
ALKALINE PHOSPHATASE (APISO): 92 U/L (ref 40–115)
ALT: 9 U/L (ref 9–46)
AST: 16 U/L (ref 10–35)
Albumin: 4.4 g/dL (ref 3.6–5.1)
BILIRUBIN TOTAL: 0.6 mg/dL (ref 0.2–1.2)
BUN: 15 mg/dL (ref 7–25)
CALCIUM: 9.7 mg/dL (ref 8.6–10.3)
CO2: 27 mmol/L (ref 20–32)
CREATININE: 1.3 mg/dL (ref 0.70–1.33)
Chloride: 102 mmol/L (ref 98–110)
Globulin: 3.1 g/dL (calc) (ref 1.9–3.7)
Glucose, Bld: 106 mg/dL — ABNORMAL HIGH (ref 65–99)
POTASSIUM: 4.2 mmol/L (ref 3.5–5.3)
Sodium: 139 mmol/L (ref 135–146)
Total Protein: 7.5 g/dL (ref 6.1–8.1)

## 2017-06-28 LAB — CBC WITH DIFFERENTIAL/PLATELET
BASOS ABS: 31 {cells}/uL (ref 0–200)
Basophils Relative: 0.5 %
Eosinophils Absolute: 49 cells/uL (ref 15–500)
Eosinophils Relative: 0.8 %
HEMATOCRIT: 46.5 % (ref 38.5–50.0)
Hemoglobin: 16 g/dL (ref 13.2–17.1)
LYMPHS ABS: 3416 {cells}/uL (ref 850–3900)
MCH: 30.1 pg (ref 27.0–33.0)
MCHC: 34.4 g/dL (ref 32.0–36.0)
MCV: 87.6 fL (ref 80.0–100.0)
MPV: 10.3 fL (ref 7.5–12.5)
Monocytes Relative: 6.5 %
NEUTROS PCT: 36.2 %
Neutro Abs: 2208 cells/uL (ref 1500–7800)
PLATELETS: 236 10*3/uL (ref 140–400)
RBC: 5.31 10*6/uL (ref 4.20–5.80)
RDW: 13 % (ref 11.0–15.0)
TOTAL LYMPHOCYTE: 56 %
WBC: 6.1 10*3/uL (ref 3.8–10.8)
WBCMIX: 397 {cells}/uL (ref 200–950)

## 2017-06-28 LAB — C. TRACHOMATIS/N. GONORRHOEAE RNA
C. trachomatis RNA, TMA: NOT DETECTED
N. gonorrhoeae RNA, TMA: NOT DETECTED

## 2017-06-28 LAB — LIPASE: LIPASE: 32 U/L (ref 7–60)

## 2017-06-28 LAB — AMYLASE: AMYLASE: 78 U/L (ref 21–101)

## 2017-06-28 LAB — RPR TITER

## 2017-06-28 LAB — PHOSPHORUS: PHOSPHORUS: 3.3 mg/dL (ref 2.5–4.5)

## 2017-06-28 LAB — FLUORESCENT TREPONEMAL AB(FTA)-IGG-BLD: Fluorescent Treponemal ABS: REACTIVE — AB

## 2017-06-28 LAB — HIV ANTIBODY (ROUTINE TESTING W REFLEX): HIV 1&2 Ab, 4th Generation: NONREACTIVE

## 2017-06-28 LAB — RPR: RPR Ser Ql: REACTIVE — AB

## 2017-06-28 LAB — CK: CK TOTAL: 83 U/L (ref 44–196)

## 2017-06-29 LAB — CT/NG RNA, TMA RECTAL
CHLAMYDIA TRACHOMATIS RNA: NOT DETECTED
Neisseria Gonorrhoeae RNA: NOT DETECTED

## 2017-07-01 ENCOUNTER — Other Ambulatory Visit: Payer: Self-pay | Admitting: Internal Medicine

## 2017-07-11 ENCOUNTER — Encounter (INDEPENDENT_AMBULATORY_CARE_PROVIDER_SITE_OTHER): Payer: Self-pay | Admitting: *Deleted

## 2017-07-11 VITALS — BP 141/83 | HR 60 | Temp 97.6°F | Wt 161.2 lb

## 2017-07-11 DIAGNOSIS — Z006 Encounter for examination for normal comparison and control in clinical research program: Secondary | ICD-10-CM

## 2017-07-11 NOTE — Progress Notes (Signed)
Study: A Phase 2b/3 Double Blind Safety and Efficacy Study of Injectable Cabotegravir compared to Daily Oral Tenofovir Disoproxil Fumarate/Emtricitabine (TDF/FTC), For Pre-Exposure Prophylaxis in HIV-Uninfected Cisgender Men and Transgender Women who have sex with Men.  Medication: Investigational Injectable Cabotegravir/placebo compared to Truvada/placebo. Duration: Around 4 years.  Mason Morales is here for week 43 visit. Denied any problems with his last injection. Rapid HIV non-reactive. No new issues or concerns verbalized. He will return in May for his next injection visit.

## 2017-07-12 LAB — COMPREHENSIVE METABOLIC PANEL
AG RATIO: 1.5 (calc) (ref 1.0–2.5)
ALT: 8 U/L — ABNORMAL LOW (ref 9–46)
AST: 17 U/L (ref 10–35)
Albumin: 4.3 g/dL (ref 3.6–5.1)
Alkaline phosphatase (APISO): 81 U/L (ref 40–115)
BILIRUBIN TOTAL: 0.5 mg/dL (ref 0.2–1.2)
BUN: 17 mg/dL (ref 7–25)
CALCIUM: 9.3 mg/dL (ref 8.6–10.3)
CHLORIDE: 104 mmol/L (ref 98–110)
CO2: 28 mmol/L (ref 20–32)
Creat: 1.19 mg/dL (ref 0.70–1.33)
GLOBULIN: 2.8 g/dL (ref 1.9–3.7)
Glucose, Bld: 79 mg/dL (ref 65–99)
Potassium: 4 mmol/L (ref 3.5–5.3)
Sodium: 139 mmol/L (ref 135–146)
Total Protein: 7.1 g/dL (ref 6.1–8.1)

## 2017-07-12 LAB — CBC WITH DIFFERENTIAL/PLATELET
BASOS PCT: 0.4 %
Basophils Absolute: 20 cells/uL (ref 0–200)
EOS ABS: 39 {cells}/uL (ref 15–500)
Eosinophils Relative: 0.8 %
HEMATOCRIT: 42.8 % (ref 38.5–50.0)
HEMOGLOBIN: 14.6 g/dL (ref 13.2–17.1)
LYMPHS ABS: 2773 {cells}/uL (ref 850–3900)
MCH: 30.2 pg (ref 27.0–33.0)
MCHC: 34.1 g/dL (ref 32.0–36.0)
MCV: 88.6 fL (ref 80.0–100.0)
MONOS PCT: 7.7 %
MPV: 9.9 fL (ref 7.5–12.5)
NEUTROS ABS: 1691 {cells}/uL (ref 1500–7800)
Neutrophils Relative %: 34.5 %
Platelets: 220 10*3/uL (ref 140–400)
RBC: 4.83 10*6/uL (ref 4.20–5.80)
RDW: 13 % (ref 11.0–15.0)
Total Lymphocyte: 56.6 %
WBC: 4.9 10*3/uL (ref 3.8–10.8)
WBCMIX: 377 {cells}/uL (ref 200–950)

## 2017-07-12 LAB — HIV ANTIBODY (ROUTINE TESTING W REFLEX): HIV 1&2 Ab, 4th Generation: NONREACTIVE

## 2017-07-12 LAB — PHOSPHORUS: Phosphorus: 3.6 mg/dL (ref 2.5–4.5)

## 2017-07-12 LAB — AMYLASE: Amylase: 75 U/L (ref 21–101)

## 2017-07-12 LAB — LIPASE: LIPASE: 38 U/L (ref 7–60)

## 2017-07-12 LAB — CK: Total CK: 101 U/L (ref 44–196)

## 2017-07-27 ENCOUNTER — Other Ambulatory Visit: Payer: Self-pay

## 2017-07-27 ENCOUNTER — Encounter (HOSPITAL_COMMUNITY): Payer: Self-pay | Admitting: *Deleted

## 2017-07-27 ENCOUNTER — Emergency Department (HOSPITAL_COMMUNITY)
Admission: EM | Admit: 2017-07-27 | Discharge: 2017-07-27 | Disposition: A | Discharge status: Home / Self Care | Payer: 59 | Attending: Emergency Medicine | Admitting: Emergency Medicine

## 2017-07-27 DIAGNOSIS — I10 Essential (primary) hypertension: Secondary | ICD-10-CM | POA: Insufficient documentation

## 2017-07-27 DIAGNOSIS — Z79899 Other long term (current) drug therapy: Secondary | ICD-10-CM | POA: Insufficient documentation

## 2017-07-27 DIAGNOSIS — Z87891 Personal history of nicotine dependence: Secondary | ICD-10-CM | POA: Insufficient documentation

## 2017-07-27 DIAGNOSIS — Z7982 Long term (current) use of aspirin: Secondary | ICD-10-CM | POA: Insufficient documentation

## 2017-07-27 DIAGNOSIS — F410 Panic disorder [episodic paroxysmal anxiety] without agoraphobia: Secondary | ICD-10-CM

## 2017-07-27 LAB — I-STAT CHEM 8, ED
BUN: 12 mg/dL (ref 6–20)
CALCIUM ION: 1.15 mmol/L (ref 1.15–1.40)
Chloride: 103 mmol/L (ref 101–111)
Creatinine, Ser: 1.1 mg/dL (ref 0.61–1.24)
GLUCOSE: 91 mg/dL (ref 65–99)
HCT: 41 % (ref 39.0–52.0)
HEMOGLOBIN: 13.9 g/dL (ref 13.0–17.0)
Potassium: 3.8 mmol/L (ref 3.5–5.1)
Sodium: 138 mmol/L (ref 135–145)
TCO2: 23 mmol/L (ref 22–32)

## 2017-07-27 LAB — I-STAT TROPONIN, ED: TROPONIN I, POC: 0 ng/mL (ref 0.00–0.08)

## 2017-07-27 NOTE — Discharge Instructions (Addendum)
Restart citalopram. If you would like to stop it, you must taper it down. Follow up with family doctor. Return if worsening.

## 2017-07-27 NOTE — ED Provider Notes (Signed)
[facility] EMERGENCY DEPARTMENT Provider Note   CSN: [id number] Arrival date & time: 07/27/17  0208     History   Chief Complaint Chief Complaint  Patient presents with  . Anxiety    HPI Mason Morales is a 62 y.o. male.  HPI Mason Morales is a 62 y.o. male presents to emergency department complaining of anxiety.  Patient states that he was at home when suddenly started feeling short of breath, had some chest tightness, states he felt "scared, I was afraid to close my eyes and go to sleep."  Symptoms began 11 PM last night.  He states that he has been on Celexa for the last 3 years, and decided to stop taking it 4 days ago.  He states he was feeling jittery yesterday and he did take Celexa last night, approximately 1 hour before his symptoms began.  He states his symptoms lasted about an hour, and he decided to come to get checked out.  He denies any prior cardiac history.  He states he is feeling much better now.  States he feels like he was panicking and anxious and thinks he had a panic attack. No hx of the same but does have anxiety  Past Medical History:  Diagnosis Date  . Allergic rhinitis, cause unspecified 10/26/2010  . Allergy   . DDD (degenerative disc disease), cervical 10/26/2010  . DDD (degenerative disc disease), lumbar 10/26/2010  . Depression   . Generalized headaches   . GERD (gastroesophageal reflux disease)   . HTN (hypertension) 10/26/2010  . Hypertension   . Kidney stones     Patient Active Problem List   Diagnosis Date Noted  . Left lumbar radiculopathy 03/26/2017  . Acute sinus infection 12/14/2016  . Pelvic pain 08/24/2016  . Left cervical radiculopathy 05/20/2015  . Anxiety state 05/20/2015  . Back pain 03/18/2012  . GERD (gastroesophageal reflux disease) 10/26/2010  . Allergic rhinitis 10/26/2010  . HTN (hypertension) 10/26/2010  . Kidney stones 10/26/2010  . DDD (degenerative disc disease), cervical 10/26/2010  . DDD  (degenerative disc disease), lumbar 10/26/2010  . Chest pain 10/26/2010  . Left shoulder pain 10/26/2010  . Encounter for well adult exam with abnormal findings 10/26/2010    Past Surgical History:  Procedure Laterality Date  . MOUTH SURGERY  at 62 yo after horse accident        Home Medications    Prior to Admission medications   Medication Sig Start Date End Date Taking? Authorizing Provider  amLODipine (NORVASC) 10 MG tablet TAKE ONE TABLET BY MOUTH DAILY 09/21/16   Corwin Levins, MD  aspirin 81 MG EC tablet Take 1 tablet (81 mg total) by mouth daily. Swallow whole. 03/18/12   Corwin Levins, MD  citalopram (CELEXA) 20 MG tablet TAKE 1 TABLET BY MOUTH EVERY DAY 09/12/16   Corwin Levins, MD  emtricitabine-tenofovir (TRUVADA) 200-300 MG tablet Patient on [id number] study. This may be placebo. DO NOT FILL. This is a study provided medication. 11/08/16   Randall Hiss, MD  gabapentin (NEURONTIN) 100 MG capsule TAKE TWO CAPSULES BY MOUTH THREE TIMES A DAY 07/01/17   Corwin Levins, MD  Investigational - Study Medication Cabotegravir/placebo 30mg  PO Additional study details: Will switch to injectable at week 5 11/08/16   Daiva Eves, Lisette Grinder, MD  meloxicam [facility]) 15 MG tablet TAKE ONE TABLET BY MOUTH DAILY 02/16/17   Corwin Levins, MD  methocarbamol (ROBAXIN) 500 MG tablet Take 500 mg  by mouth as needed for muscle spasms.    [provider]  omeprazole (PRILOSEC) 20 MG capsule Take 1 capsule (20 mg total) by mouth daily as needed (for heartburn). 08/16/16   Corwin Levins, MD  predniSONE (DELTASONE) 10 MG tablet 3 tabs by mouth per day for 3 days,2tabs per day for 3 days,1tab per day for 3 days 03/26/17   Corwin Levins, MD  traMADol (ULTRAM) 50 MG tablet Take 1 tablet (50 mg total) by mouth every 6 (six) hours as needed for severe pain. 03/26/17   Corwin Levins, MD  pantoprazole (PROTONIX) 20 MG tablet Take 1 tablet (20 mg total) by mouth daily. Patient not taking: Reported on 05/05/2015  02/26/15 05/09/15  Arby Barrette, MD    Family History Family History  Problem Relation Age of Onset  . Hyperlipidemia Other   . Heart disease Other   . Stroke Other   . Hypertension Other   . Diabetes Other   . Cancer Other        prostate cancer  . Diabetes Other   . Alcohol abuse Other   . Cancer Other        breast cancer  . Stroke Other   . Colon cancer Neg Hx   . Esophageal cancer Neg Hx   . Pancreatic cancer Neg Hx   . Rectal cancer Neg Hx   . Stomach cancer Neg Hx     Social History Social History   Tobacco Use  . Smoking status: Former Smoker    Last attempt to quit: 03/20/2014    Years since quitting: 3.3  . Smokeless tobacco: Never Used  Substance Use Topics  . Alcohol use: Yes    Alcohol/week: 3.6 oz    Types: 6 Cans of beer per week  . Drug use: Yes    Types: Marijuana    Comment: THC-daily     Allergies   Isovue [iopamidol] and Sulfa antibiotics   Review of Systems Review of Systems  Constitutional: Negative for chills and fever.  Respiratory: Positive for chest tightness and shortness of breath. Negative for cough.   Cardiovascular: Negative for chest pain, palpitations and leg swelling.  Gastrointestinal: Negative for abdominal distention, abdominal pain, diarrhea, nausea and vomiting.  Genitourinary: Negative for dysuria, frequency, hematuria and urgency.  Musculoskeletal: Negative for arthralgias, myalgias, neck pain and neck stiffness.  Skin: Negative for rash.  Allergic/Immunologic: Negative for immunocompromised state.  Neurological: Positive for dizziness and light-headedness. Negative for weakness, numbness and headaches.  Psychiatric/Behavioral: The patient is nervous/anxious.      Physical Exam Updated Vital Signs BP (!) 167/103 (BP Location: Right Arm)   Pulse (!) 56   Temp 98 F (36.7 C) (Oral)   Resp 18   Ht 5\' 9"  (1.753 m)   Wt 74.8 kg (165 lb)   SpO2 96%   BMI 24.37 kg/m   Physical Exam  Constitutional: He  appears well-developed and well-nourished. No distress.  HENT:  Head: Normocephalic and atraumatic.  Eyes: Conjunctivae are normal.  Neck: Neck supple.  Cardiovascular: Normal rate, regular rhythm and normal heart sounds.  Pulmonary/Chest: Effort normal. No respiratory distress. He has no wheezes. He has no rales.  Abdominal: Soft. Bowel sounds are normal. He exhibits no distension. There is no tenderness. There is no rebound.  Musculoskeletal: He exhibits no edema.  Neurological: He is alert.  Skin: Skin is warm and dry.  Nursing note and vitals reviewed.    ED Treatments / Results  Labs (  all labs ordered are listed, but only abnormal results are displayed) Labs Reviewed  I-STAT TROPONIN, ED  I-STAT CHEM 8, ED    EKG None  Radiology No results found.  Procedures Procedures (including critical care time)  Medications Ordered in ED Medications - No data to display   Initial Impression / Assessment and Plan / ED Course  I have reviewed the triage vital signs and the nursing notes.  Pertinent labs & imaging results that were available during my care of the patient were reviewed by me and considered in my medical decision making (see chart for details).     Patient with episode of anxiety, had associated chest tightness and shortness of breath, all symptoms now resolved.  No history of the same, although states he does have anxiety.  Stopped Celexa 4 days ago, but did have a dose last night.  At this time symptom-free.  Will get EKG and troponin.  We discussed at length tapering his Celexa or continuing taking medication.  He states he really has no reason why he stopped it just stated that he was tired of taking all his medications.  7:05 AM Labs unremarkable. Troponin negative. I do not think he needs 2nd trop since symptoms began 10 hrs ago.  Pt still asymptomatic. Plan to dc home with close outpatient follow up. Discussed better blood pressure control, he has not had his  medications last night or this morning. Return precautions discussed.   Vitals:   07/27/17 0215 07/27/17 0600 07/27/17 0601  BP: (!) 143/83 (!) 167/103 (!) 167/103  Pulse: 72 60 (!) 56  Resp: 16  18  Temp: 98 F (36.7 C)    TempSrc: Oral    SpO2: 100% 96% 96%  Weight: 74.8 kg (165 lb)    Height: 5\' 9"  (1.753 m)       Final Clinical Impressions(s) / ED Diagnoses   Final diagnoses:  Panic attack    ED Discharge Orders    None       Jaynie CrumbleKirichenko, Saben Donigan, PA-C 07/27/17 0708    Dione BoozeGlick, David, MD 07/27/17 [phone]

## 2017-07-27 NOTE — ED Triage Notes (Signed)
The pt has had anxiety  Attacks for 3 days he tried to stop some of his meds and he thinks he was withdrawing from them.  He started taking the medicine again today.  At present calm no distress

## 2017-08-07 ENCOUNTER — Encounter (HOSPITAL_COMMUNITY): Payer: Self-pay

## 2017-08-07 ENCOUNTER — Emergency Department (HOSPITAL_COMMUNITY): Payer: Self-pay

## 2017-08-07 ENCOUNTER — Other Ambulatory Visit: Payer: Self-pay

## 2017-08-07 ENCOUNTER — Emergency Department (HOSPITAL_COMMUNITY)
Admission: EM | Admit: 2017-08-07 | Discharge: 2017-08-07 | Disposition: A | Discharge status: Home / Self Care | Payer: Self-pay | Attending: Emergency Medicine | Admitting: Emergency Medicine

## 2017-08-07 DIAGNOSIS — W19XXXA Unspecified fall, initial encounter: Secondary | ICD-10-CM

## 2017-08-07 DIAGNOSIS — Z79899 Other long term (current) drug therapy: Secondary | ICD-10-CM | POA: Insufficient documentation

## 2017-08-07 DIAGNOSIS — M7918 Myalgia, other site: Secondary | ICD-10-CM | POA: Insufficient documentation

## 2017-08-07 DIAGNOSIS — Y929 Unspecified place or not applicable: Secondary | ICD-10-CM | POA: Insufficient documentation

## 2017-08-07 DIAGNOSIS — W01198A Fall on same level from slipping, tripping and stumbling with subsequent striking against other object, initial encounter: Secondary | ICD-10-CM | POA: Insufficient documentation

## 2017-08-07 DIAGNOSIS — S0990XA Unspecified injury of head, initial encounter: Secondary | ICD-10-CM | POA: Insufficient documentation

## 2017-08-07 DIAGNOSIS — Z87891 Personal history of nicotine dependence: Secondary | ICD-10-CM | POA: Insufficient documentation

## 2017-08-07 DIAGNOSIS — Y998 Other external cause status: Secondary | ICD-10-CM | POA: Insufficient documentation

## 2017-08-07 DIAGNOSIS — I10 Essential (primary) hypertension: Secondary | ICD-10-CM | POA: Insufficient documentation

## 2017-08-07 DIAGNOSIS — R52 Pain, unspecified: Secondary | ICD-10-CM

## 2017-08-07 DIAGNOSIS — Z7982 Long term (current) use of aspirin: Secondary | ICD-10-CM | POA: Insufficient documentation

## 2017-08-07 DIAGNOSIS — Y9352 Activity, horseback riding: Secondary | ICD-10-CM | POA: Insufficient documentation

## 2017-08-07 DIAGNOSIS — R1011 Right upper quadrant pain: Secondary | ICD-10-CM | POA: Insufficient documentation

## 2017-08-07 DIAGNOSIS — R1031 Right lower quadrant pain: Secondary | ICD-10-CM | POA: Insufficient documentation

## 2017-08-07 LAB — URINALYSIS, ROUTINE W REFLEX MICROSCOPIC
Bilirubin Urine: NEGATIVE
GLUCOSE, UA: NEGATIVE mg/dL
Ketones, ur: NEGATIVE mg/dL
LEUKOCYTES UA: NEGATIVE
NITRITE: NEGATIVE
PH: 5 (ref 5.0–8.0)
Protein, ur: NEGATIVE mg/dL
SPECIFIC GRAVITY, URINE: 1.018 (ref 1.005–1.030)
Squamous Epithelial / LPF: NONE SEEN

## 2017-08-07 LAB — CBC
HEMATOCRIT: 39.9 % (ref 39.0–52.0)
HEMOGLOBIN: 13.6 g/dL (ref 13.0–17.0)
MCH: 31.4 pg (ref 26.0–34.0)
MCHC: 34.1 g/dL (ref 30.0–36.0)
MCV: 92.1 fL (ref 78.0–100.0)
Platelets: 189 10*3/uL (ref 150–400)
RBC: 4.33 MIL/uL (ref 4.22–5.81)
RDW: 13.3 % (ref 11.5–15.5)
WBC: 6.4 10*3/uL (ref 4.0–10.5)

## 2017-08-07 LAB — HEPATIC FUNCTION PANEL
ALT: 15 U/L — AB (ref 17–63)
AST: 25 U/L (ref 15–41)
Albumin: 3.8 g/dL (ref 3.5–5.0)
Alkaline Phosphatase: 82 U/L (ref 38–126)
BILIRUBIN DIRECT: 0.1 mg/dL (ref 0.1–0.5)
BILIRUBIN INDIRECT: 0.6 mg/dL (ref 0.3–0.9)
TOTAL PROTEIN: 7.2 g/dL (ref 6.5–8.1)
Total Bilirubin: 0.7 mg/dL (ref 0.3–1.2)

## 2017-08-07 LAB — BASIC METABOLIC PANEL
ANION GAP: 10 (ref 5–15)
BUN: 17 mg/dL (ref 6–20)
CALCIUM: 8.9 mg/dL (ref 8.9–10.3)
CO2: 21 mmol/L — ABNORMAL LOW (ref 22–32)
Chloride: 102 mmol/L (ref 101–111)
Creatinine, Ser: 1.23 mg/dL (ref 0.61–1.24)
Glucose, Bld: 104 mg/dL — ABNORMAL HIGH (ref 65–99)
Potassium: 3.7 mmol/L (ref 3.5–5.1)
Sodium: 133 mmol/L — ABNORMAL LOW (ref 135–145)

## 2017-08-07 LAB — I-STAT TROPONIN, ED: TROPONIN I, POC: 0 ng/mL (ref 0.00–0.08)

## 2017-08-07 MED ORDER — ACETAMINOPHEN 325 MG PO TABS: 650.0000 mg | ORAL_TABLET | Freq: Four times a day (QID) | ORAL | 0 refills | Status: DC | PRN

## 2017-08-07 MED ORDER — IOPAMIDOL (ISOVUE-300) INJECTION 61%
INTRAVENOUS | Status: AC
  Filled 2017-08-07: qty 100

## 2017-08-07 MED ORDER — SODIUM CHLORIDE 0.9 % IV BOLUS
500.0000 mL | Freq: Once | INTRAVENOUS | Status: AC
  Administered 2017-08-07: 500 mL via INTRAVENOUS

## 2017-08-07 MED ORDER — IOPAMIDOL (ISOVUE-300) INJECTION 61%
100.0000 mL | Freq: Once | INTRAVENOUS | Status: AC | PRN
Start: 2017-08-07 — End: 2017-08-07
  Administered 2017-08-07: 100 mL via INTRAVENOUS

## 2017-08-07 MED ORDER — DIPHENHYDRAMINE HCL 50 MG/ML IJ SOLN
50.0000 mg | Freq: Once | INTRAMUSCULAR | Status: AC
  Administered 2017-08-07: 50 mg via INTRAVENOUS
  Filled 2017-08-07: qty 1

## 2017-08-07 MED ORDER — HYDROCORTISONE NA SUCCINATE PF 250 MG IJ SOLR: 200.0000 mg | Freq: Once | INTRAMUSCULAR | Status: DC

## 2017-08-07 MED ORDER — HYDROCORTISONE NA SUCCINATE PF 100 MG IJ SOLR
200.0000 mg | Freq: Once | INTRAMUSCULAR | Status: AC
  Administered 2017-08-07: 200 mg via INTRAVENOUS
  Filled 2017-08-07: qty 4

## 2017-08-07 MED ORDER — CYCLOBENZAPRINE HCL 5 MG PO TABS: 5.0000 mg | ORAL_TABLET | Freq: Two times a day (BID) | ORAL | 0 refills | Status: DC | PRN

## 2017-08-07 MED ORDER — DIPHENHYDRAMINE HCL 50 MG/ML IJ SOLN: 25.0000 mg | Freq: Once | INTRAMUSCULAR | Status: DC

## 2017-08-07 MED ORDER — ACETAMINOPHEN 325 MG PO TABS
650.0000 mg | ORAL_TABLET | Freq: Once | ORAL | Status: AC
  Administered 2017-08-07: 650 mg via ORAL
  Filled 2017-08-07: qty 2

## 2017-08-07 NOTE — ED Notes (Signed)
Patient transported to CT 

## 2017-08-07 NOTE — ED Notes (Signed)
Pt able to ambulate slowly to wheelchair.

## 2017-08-07 NOTE — ED Triage Notes (Signed)
Patient here for a fall off a horse this evening, able to walk after incident.  Drove himself to hospital.  Pain noted with movement at this time.  Unsure of any loss of consciousness.  Took tramadol PTA.  A&Ox4.  Complains of chest and back pain.

## 2017-08-07 NOTE — ED Provider Notes (Signed)
[facility] EMERGENCY DEPARTMENT Provider Note   CSN: [id number] Arrival date & time: 08/07/17  0026     History   Chief Complaint Chief Complaint  Patient presents with  . Fall    HPI Benn A Uzelac is a 62 y.o. male.  HPI   Pt is a 62 year old male with history of degenerative disc disease, hypertension, who presents the ED today to be evaluated after he jumped off of a horse around 6:54 PM last night.  Patient states that he was training a horse when the horse began to turn around in circles, he was concerned that he was going to get thrown off so he jumped off of horse.  States that he hit his head and also hit his tailbone on the ground.  He denies any loss consciousness.  Denies any headaches, vision changes, lightheadedness, dizziness, numbness or weakness to his bilateral upper or lower extremities.  Denies any urinary or bowel incontinence.  Denies any urinary retention.  Was initially complaining of bilateral lower anterior rib pain upon arrival to the ED however states that had resolved since then.  He denies any shortness of breath.  He is now endorsing right upper quadrant right lower quadrant and right flank pain that is 5/10 at rest and 10/10 with movement.  States it feels like a pulling pain.  He has not taken any medications for his symptoms.  He is denying any neck or upper back pain.  Denies any pain to his bilateral upper or lower extremities.  No nausea, vomiting, diarrhea, consultation.  Past Medical History:  Diagnosis Date  . Allergic rhinitis, cause unspecified 10/26/2010  . Allergy   . DDD (degenerative disc disease), cervical 10/26/2010  . DDD (degenerative disc disease), lumbar 10/26/2010  . Depression   . Generalized headaches   . GERD (gastroesophageal reflux disease)   . HTN (hypertension) 10/26/2010  . Hypertension   . Kidney stones     Patient Active Problem List   Diagnosis Date Noted  . Left lumbar radiculopathy 03/26/2017  .  Acute sinus infection 12/14/2016  . Pelvic pain 08/24/2016  . Left cervical radiculopathy 05/20/2015  . Anxiety state 05/20/2015  . Back pain 03/18/2012  . GERD (gastroesophageal reflux disease) 10/26/2010  . Allergic rhinitis 10/26/2010  . HTN (hypertension) 10/26/2010  . Kidney stones 10/26/2010  . DDD (degenerative disc disease), cervical 10/26/2010  . DDD (degenerative disc disease), lumbar 10/26/2010  . Chest pain 10/26/2010  . Left shoulder pain 10/26/2010  . Encounter for well adult exam with abnormal findings 10/26/2010    Past Surgical History:  Procedure Laterality Date  . MOUTH SURGERY  at 62 yo after horse accident        Home Medications    Prior to Admission medications   Medication Sig Start Date End Date Taking? Authorizing Provider  amLODipine (NORVASC) 10 MG tablet TAKE ONE TABLET BY MOUTH DAILY 09/21/16  Yes Corwin Levins, MD  aspirin 81 MG EC tablet Take 1 tablet (81 mg total) by mouth daily. Swallow whole. Patient taking differently: Take 81 mg by mouth once a week. Swallow whole. 03/18/12  Yes Corwin Levins, MD  citalopram (CELEXA) 20 MG tablet TAKE 1 TABLET BY MOUTH EVERY DAY 09/12/16  Yes Corwin Levins, MD  emtricitabine-tenofovir (TRUVADA) 200-300 MG tablet Patient on [id number] study. This may be placebo. DO NOT FILL. This is a study provided medication. 11/08/16  Yes Daiva Eves, Lisette Grinder, MD  gabapentin (NEURONTIN) 100 MG capsule  TAKE TWO CAPSULES BY MOUTH THREE TIMES A DAY 07/01/17  Yes Corwin Levins, MD  meloxicam [facility]) 15 MG tablet TAKE ONE TABLET BY MOUTH DAILY 02/16/17  Yes Corwin Levins, MD  methocarbamol (ROBAXIN) 500 MG tablet Take 500 mg by mouth as needed for muscle spasms.   Yes [provider]  omeprazole (PRILOSEC) 20 MG capsule Take 1 capsule (20 mg total) by mouth daily as needed (for heartburn). 08/16/16  Yes Corwin Levins, MD  traMADol (ULTRAM) 50 MG tablet Take 1 tablet (50 mg total) by mouth every 6 (six) hours as needed for severe  pain. 03/26/17  Yes Corwin Levins, MD  acetaminophen (TYLENOL) 325 MG tablet Take 2 tablets (650 mg total) by mouth every 6 (six) hours as needed. Do not take more than 4000mg  of tylenol per day 08/07/17   Daizy Outen S, PA-C  cyclobenzaprine (FLEXERIL) 5 MG tablet Take 1 tablet (5 mg total) by mouth 2 (two) times daily as needed for muscle spasms. 08/07/17   Sherise Geerdes S, PA-C    Family History Family History  Problem Relation Age of Onset  . Hyperlipidemia Other   . Heart disease Other   . Stroke Other   . Hypertension Other   . Diabetes Other   . Cancer Other        prostate cancer  . Diabetes Other   . Alcohol abuse Other   . Cancer Other        breast cancer  . Stroke Other   . Colon cancer Neg Hx   . Esophageal cancer Neg Hx   . Pancreatic cancer Neg Hx   . Rectal cancer Neg Hx   . Stomach cancer Neg Hx     Social History Social History   Tobacco Use  . Smoking status: Former Smoker    Last attempt to quit: 03/20/2014    Years since quitting: 3.3  . Smokeless tobacco: Never Used  Substance Use Topics  . Alcohol use: Yes    Alcohol/week: 3.6 oz    Types: 6 Cans of beer per week  . Drug use: Yes    Types: Marijuana    Comment: THC-daily     Allergies   Isovue [iopamidol] and Sulfa antibiotics   Review of Systems Review of Systems  Constitutional: Negative for fever.  HENT:       No nasal pain  Eyes: Negative for visual disturbance.  Respiratory: Negative for shortness of breath.   Cardiovascular: Positive for chest pain (resolved).  Gastrointestinal: Positive for abdominal pain. Negative for constipation, diarrhea, nausea and vomiting.  Genitourinary: Positive for flank pain. Negative for hematuria.  Musculoskeletal: Positive for back pain. Negative for neck pain.  Skin: Negative for wound.  Neurological: Negative for dizziness, weakness, light-headedness, numbness and headaches.     Physical Exam Updated Vital Signs BP (!) 141/86   Pulse  66   Temp 97.8 F (36.6 C) (Oral)   Resp 16   Ht 5\' 9"  (1.753 m)   Wt 74.8 kg (165 lb)   SpO2 100%   BMI 24.37 kg/m   Physical Exam  Constitutional: He appears well-developed and well-nourished.  Mild distress  HENT:  Head: Normocephalic and atraumatic.  Right Ear: External ear normal.  Left Ear: External ear normal.  Mouth/Throat: Oropharynx is clear and moist.  No battle signs, no raccoons eyes, no rhinorrhea.  Eyes: Pupils are equal, round, and reactive to light. Conjunctivae and EOM are normal.  Neck: Normal range of  motion. Neck supple.  Cardiovascular: Normal rate, regular rhythm, normal heart sounds and intact distal pulses.  No murmur heard. Pulmonary/Chest: Effort normal and breath sounds normal. No respiratory distress. He has no wheezes.  No tenderness to the chest or posterior ribs bilaterally  Abdominal: Soft.  Right CVA tenderness without overlying ecchymosis or skin changes, right upper quadrant and right lower quadrant tenderness.  No rigidity.  Positive guarding.  Bowel sounds are normal.  Musculoskeletal: He exhibits no edema.  Patient has tenderness to the sacral spine.  No cervical, thoracic, or lumbar midline tenderness.  No paraspinous muscle tenderness.  Neurological: He is alert.  Mental Status:  Alert, thought content appropriate, able to give a coherent history. Speech fluent without evidence of aphasia. Able to follow 2 step commands without difficulty.  Cranial Nerves:  II:  pupils equal, round, reactive to light III,IV, VI: ptosis not present, extra-ocular motions intact bilaterally  V,VII: smile symmetric, facial light touch sensation equal VIII: hearing grossly normal to voice  X: uvula elevates symmetrically  XI: bilateral shoulder shrug symmetric and strong XII: midline tongue extension without fassiculations Motor:  Normal tone. 5/5 strength of BUE and BLE major muscle groups including strong and equal grip strength and dorsiflexion/plantar  flexion Sensory: light touch normal in all extremities. CV: 2+ radial and DP/PT pulses  Skin: Skin is warm and dry.  No ecchymosis to the abdomen or back  Psychiatric: He has a normal mood and affect.  Nursing note and vitals reviewed.    ED Treatments / Results  Labs (all labs ordered are listed, but only abnormal results are displayed) Labs Reviewed  BASIC METABOLIC PANEL - Abnormal; Notable for the following components:      Result Value   Sodium 133 (*)    CO2 21 (*)    Glucose, Bld 104 (*)    All other components within normal limits  URINALYSIS, ROUTINE W REFLEX MICROSCOPIC - Abnormal; Notable for the following components:   Hgb urine dipstick LARGE (*)    Bacteria, UA RARE (*)    All other components within normal limits  HEPATIC FUNCTION PANEL - Abnormal; Notable for the following components:   ALT 15 (*)    All other components within normal limits  CBC  I-STAT TROPONIN, ED    EKG None  Radiology Dg Chest 2 View  Result Date: 08/07/2017 CLINICAL DATA:  Larey Seat from horse with chest pain EXAM: CHEST - 2 VIEW COMPARISON:  08/26/2015 FINDINGS: The heart size and mediastinal contours are within normal limits. Both lungs are clear. Degenerative osteophytes of the spine. IMPRESSION: No active cardiopulmonary disease. Electronically Signed   By: Jasmine Pang M.D.   On: 08/07/2017 01:25   Ct Head Wo Contrast  Result Date: 08/07/2017 CLINICAL DATA:  Status post fall off horse, with concern for head or cervical spine injury. EXAM: CT HEAD WITHOUT CONTRAST CT CERVICAL SPINE WITHOUT CONTRAST TECHNIQUE: Multidetector CT imaging of the head and cervical spine was performed following the standard protocol without intravenous contrast. Multiplanar CT image reconstructions of the cervical spine were also generated. COMPARISON:  MRI of the cervical spine performed 05/27/2015 FINDINGS: CT HEAD FINDINGS Brain: No evidence of acute infarction, hemorrhage, hydrocephalus, extra-axial  collection or mass lesion/mass effect. 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 The posterior fossa, including the cerebellum, brainstem and fourth ventricle, is within normal limits. The third and lateral ventricles, and basal ganglia are unremarkable in appearance. No mass effect or midline shift is  seen. Vascular: No hyperdense vessel or unexpected calcification. Skull: There is no evidence of fracture; visualized osseous structures are unremarkable in appearance. Sinuses/Orbits: The orbits are within normal limits. The paranasal sinuses and mastoid air cells are well-aerated. Other: No significant soft tissue abnormalities are seen. CT CERVICAL SPINE FINDINGS Alignment: Normal. Skull base and vertebrae: No acute fracture. No primary bone lesion or focal pathologic process. Soft tissues and spinal canal: No prevertebral fluid or swelling. No visible canal hematoma. Disc levels: Minimal intervertebral disc space narrowing is noted at the lower cervical spine. Scattered anterior and posterior disc osteophyte complexes are noted along the cervical spine. Upper chest: The thyroid gland is unremarkable in appearance. The visualized lung apices are clear. Other: No additional soft tissue abnormalities are seen. IMPRESSION: 1. No evidence of traumatic intracranial injury or fracture. 2. No evidence of fracture or subluxation along the cervical spine. 3. 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 4. Minimal degenerative change along the cervical spine. Electronically Signed   By: Roanna Raider M.D.   On: 08/07/2017 01:51   Ct Cervical Spine Wo Contrast  Result Date: 08/07/2017 CLINICAL DATA:  Status post fall off horse, with concern for head or cervical spine injury. EXAM: CT HEAD WITHOUT CONTRAST CT CERVICAL SPINE WITHOUT CONTRAST TECHNIQUE: Multidetector CT imaging of the head and cervical spine was performed following the standard protocol without intravenous contrast. Multiplanar CT  image reconstructions of the cervical spine were also generated. COMPARISON:  MRI of the cervical spine performed 05/27/2015 FINDINGS: CT HEAD FINDINGS Brain: No evidence of acute infarction, hemorrhage, hydrocephalus, extra-axial collection or mass lesion/mass effect. 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 The posterior fossa, including the cerebellum, brainstem and fourth ventricle, is within normal limits. The third and lateral ventricles, and basal ganglia are unremarkable in appearance. No mass effect or midline shift is seen. Vascular: No hyperdense vessel or unexpected calcification. Skull: There is no evidence of fracture; visualized osseous structures are unremarkable in appearance. Sinuses/Orbits: The orbits are within normal limits. The paranasal sinuses and mastoid air cells are well-aerated. Other: No significant soft tissue abnormalities are seen. CT CERVICAL SPINE FINDINGS Alignment: Normal. Skull base and vertebrae: No acute fracture. No primary bone lesion or focal pathologic process. Soft tissues and spinal canal: No prevertebral fluid or swelling. No visible canal hematoma. Disc levels: Minimal intervertebral disc space narrowing is noted at the lower cervical spine. Scattered anterior and posterior disc osteophyte complexes are noted along the cervical spine. Upper chest: The thyroid gland is unremarkable in appearance. The visualized lung apices are clear. Other: No additional soft tissue abnormalities are seen. IMPRESSION: 1. No evidence of traumatic intracranial injury or fracture. 2. No evidence of fracture or subluxation along the cervical spine. 3. 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 4. Minimal degenerative change along the cervical spine. Electronically Signed   By: Roanna Raider M.D.   On: 08/07/2017 01:51   Ct Abdomen Pelvis W Contrast  Result Date: 08/07/2017 CLINICAL DATA:  RIGHT lower quadrant pain after falling off a horse, blunt abdominal  trauma with microscopic hematuria, history kidney stones, hypertension GERD, former smoker EXAM: CT ABDOMEN AND PELVIS WITH CONTRAST TECHNIQUE: Multidetector CT imaging of the abdomen and pelvis was performed using the standard protocol following bolus administration of intravenous contrast. Sagittal and coronal MPR images reconstructed from axial data set. CONTRAST:  ISOVUE-300 IOPAMIDOL (ISOVUE-300) INJECTION 61% IV. No oral contrast was administered. COMPARISON:  10/25/2016 FINDINGS: Lower chest:  Lung bases clear Hepatobiliary: Gallbladder and liver normal appearance Pancreas: Normal appearance Spleen: Normal appearance Adrenals/Urinary Tract: Tiny LEFT renal cyst. Adrenal glands, kidneys, ureters, and bladder otherwise normal appearance. No renal injury or perinephric stranding. Stomach/Bowel: Normal appendix. Stomach and bowel loops normal appearance. Vascular/Lymphatic: Vascular structures unremarkable. No adenopathy. Reproductive: Prostate gland and seminal vesicles unremarkable Other: No free air or free fluid. No hernia or inflammatory process. No obvious hematomas. Musculoskeletal: Scattered degenerative disc disease changes lumbar spine. No acute osseous findings. Central acquired spinal stenosis at L4-L5, multifactorial. IMPRESSION: No acute intra-abdominal or intrapelvic injuries identified. Degenerative disc disease changes lumbar spine with central acquired spinal stenosis at L4-L5. Electronically Signed   By: Ulyses Southward M.D.   On: 08/07/2017 11:58   Dg Hips Bilat With Pelvis 3-4 Views  Result Date: 08/07/2017 CLINICAL DATA:  Larey Seat from horse EXAM: DG HIP (WITH OR WITHOUT PELVIS) 3-4V BILAT COMPARISON:  None. FINDINGS: Pubic symphysis is intact. No fracture or malalignment. SI joints are non widened. There are mild degenerative changes of both hips IMPRESSION: No acute osseous abnormality. Electronically Signed   By: Jasmine Pang M.D.   On: 08/07/2017 01:27    Procedures Procedures  (including critical care time)  Medications Ordered in ED Medications  diphenhydrAMINE (BENADRYL) injection 50 mg (50 mg Intravenous Given 08/07/17 0917)  hydrocortisone sodium succinate (SOLU-CORTEF) 100 MG injection 200 mg (200 mg Intravenous Given 08/07/17 0636)  sodium chloride 0.9 % bolus 500 mL (0 mLs Intravenous Stopped 08/07/17 0757)  acetaminophen (TYLENOL) tablet 650 mg (650 mg Oral Given 08/07/17 0729)  iopamidol (ISOVUE-300) 61 % injection 100 mL (100 mLs Intravenous Contrast Given 08/07/17 1127)     Initial Impression / Assessment and Plan / ED Course  I have reviewed the triage vital signs and the nursing notes.  Pertinent labs & imaging results that were available during my care of the patient were reviewed by me and considered in my medical decision making (see chart for details).   11:17 PM Discussed case with radiologist, Dr. Molli Posey, who states that pt can still completed ct abd/pelvis with contrast. Dr Molli Posey called back and states he will contact CT at pt is almost out of the 5 hour window of not being able getting his CT scan.   11:29 PM Pt in CT.  Final Clinical Impressions(s) / ED Diagnoses   Final diagnoses:  Fall, initial encounter  Injury of head, initial encounter  Musculoskeletal pain   Patient presenting after fall from horse.  His vital signs are stable.  He is in mild distress on exam.  Prior to my seeing the patient labs and chest x-ray, bilat hips/pelvis xray, head CT, CT cervical spine were ordered.  Chest x-ray without active cardiopulmonary disease, no evidence of pneumothorax or rib fractures.  Bilateral hip x-ray with pelvis without osseous abnormality.  Head CT negative for hemorrhage or other acute intracranial abnormality.  CT cervical spine without evidence of fracture or subluxation along the cervical spine.  On my exam patient states that his bilateral anterior rib pain has resolved.  Denies any shortness of breath.  Is endorsing right upper  quadrant right lower quadrant and right CVA tenderness.  He has a contrast allergy.  Will premedicate and obtain CT abdomen pelvis to further evaluate abdominal pain.  Lab work with mild hyponatremia and mild Lee low bicarb.  Normal creatinine.  CBC without leukocytosis or anemia.  UA with hematuria no evidence of infection.  Troponin negative.  Hepatic version within normal limits.  ECG with normal sinus rhythm heart rate 77.  PVCs noted.  Otherwise normal ECG with no ischemic changes.  CT abdomen pelvis did not show evidence of acute intra-abdominal or intra-heart pelvic injury.  Patient is still endorsing some pain to the abdomen, however repeat abdominal exam showed the reveals the abdomen is not surgical at this time.  No rigidity or rebound tenderness.  Patient still does have tenderness with guarding to the right lower quadrant and right flank.  Discussed that patient will likely have continued pain tomorrow will send him home with Flexeril and Tylenol for pain.  Advised not to work or drive or drink alcohol while taking Flexeril.  Patient understands the plan.  Advised him to follow-up with his PCP in 1 week for reevaluation.  Return precautions discussed for any new or worsening symptoms.  Patient understands plan and reasons to return.  All questions were answered.  ED Discharge Orders        Ordered    cyclobenzaprine (FLEXERIL) 5 MG tablet  2 times daily PRN     08/07/17 1317    acetaminophen (TYLENOL) 325 MG tablet  Every 6 hours PRN     08/07/17 1317       Vesta Wheeland S, PA-C 08/07/17 1731    Shon BatonHorton, Courtney F, MD 08/08/17 1320

## 2017-08-07 NOTE — ED Notes (Signed)
Pt returned from CT °

## 2017-08-07 NOTE — Discharge Instructions (Signed)
You were given a prescription for Flexeril which is a muscle relaxer.  You should not drive, work, or operate machinery while taking this medication as it can make you very drowsy.  You were also given a prescription for Tylenol which can take every 6 hours for your pain.  You should follow-up with your primary care doctor in 1 week for reevaluation and he should return to the ER for any persistent or worsening abdominal pain, chest pain, shortness of breath, or any new or worsening symptoms.

## 2017-08-07 NOTE — ED Notes (Signed)
Patient said he was riding the horse and the horse was getting to wild so he jumped off.  Landed on back and then hit his head.  Complains of bilateral hip pain as well.

## 2017-08-22 ENCOUNTER — Encounter (INDEPENDENT_AMBULATORY_CARE_PROVIDER_SITE_OTHER): Payer: Self-pay | Admitting: *Deleted

## 2017-08-22 VITALS — BP 164/91 | HR 61 | Temp 97.4°F | Wt 165.5 lb

## 2017-08-22 DIAGNOSIS — Z006 Encounter for examination for normal comparison and control in clinical research program: Secondary | ICD-10-CM

## 2017-08-22 LAB — CBC WITH DIFFERENTIAL/PLATELET
BASOS PCT: 0.8 %
Basophils Absolute: 38 cells/uL (ref 0–200)
EOS PCT: 1.5 %
Eosinophils Absolute: 71 cells/uL (ref 15–500)
HCT: 41.3 % (ref 38.5–50.0)
HEMOGLOBIN: 14.2 g/dL (ref 13.2–17.1)
Lymphs Abs: 2195 cells/uL (ref 850–3900)
MCH: 30.5 pg (ref 27.0–33.0)
MCHC: 34.4 g/dL (ref 32.0–36.0)
MCV: 88.8 fL (ref 80.0–100.0)
MONOS PCT: 7 %
MPV: 10 fL (ref 7.5–12.5)
NEUTROS ABS: 2068 {cells}/uL (ref 1500–7800)
Neutrophils Relative %: 44 %
Platelets: 217 10*3/uL (ref 140–400)
RBC: 4.65 10*6/uL (ref 4.20–5.80)
RDW: 13.1 % (ref 11.0–15.0)
TOTAL LYMPHOCYTE: 46.7 %
WBC mixed population: 329 cells/uL (ref 200–950)
WBC: 4.7 10*3/uL (ref 3.8–10.8)

## 2017-08-22 LAB — COMPREHENSIVE METABOLIC PANEL
AG Ratio: 1.6 (calc) (ref 1.0–2.5)
ALT: 8 U/L — ABNORMAL LOW (ref 9–46)
AST: 18 U/L (ref 10–35)
Albumin: 4.1 g/dL (ref 3.6–5.1)
Alkaline phosphatase (APISO): 142 U/L — ABNORMAL HIGH (ref 40–115)
BUN: 16 mg/dL (ref 7–25)
CHLORIDE: 105 mmol/L (ref 98–110)
CO2: 27 mmol/L (ref 20–32)
Calcium: 9.1 mg/dL (ref 8.6–10.3)
Creat: 1.28 mg/dL (ref 0.70–1.33)
GLOBULIN: 2.6 g/dL (ref 1.9–3.7)
GLUCOSE: 84 mg/dL (ref 65–99)
Potassium: 4 mmol/L (ref 3.5–5.3)
SODIUM: 140 mmol/L (ref 135–146)
TOTAL PROTEIN: 6.7 g/dL (ref 6.1–8.1)
Total Bilirubin: 0.4 mg/dL (ref 0.2–1.2)

## 2017-08-22 LAB — PHOSPHORUS: PHOSPHORUS: 3 mg/dL (ref 2.5–4.5)

## 2017-08-22 LAB — AMYLASE: Amylase: 68 U/L (ref 21–101)

## 2017-08-22 LAB — CK: Total CK: 127 U/L (ref 44–196)

## 2017-08-22 LAB — LIPASE: LIPASE: 41 U/L (ref 7–60)

## 2017-08-22 NOTE — Progress Notes (Signed)
Mason Morales is here for week 41 HPTN 083 visit. BP elevated this morning but states he did not take his BP medication this morning. Continues to have mild tenderness in his tailbone area from injury after falling from his horse. Rapid HIV non-reactive. Cabotegravir/placebo injection given (R) glute. Site unremarkable. Next study visit scheduled for 09/04/17.

## 2017-08-23 LAB — HIV ANTIBODY (ROUTINE TESTING W REFLEX): HIV 1&2 Ab, 4th Generation: NONREACTIVE

## 2017-09-01 ENCOUNTER — Other Ambulatory Visit: Payer: Self-pay

## 2017-09-01 ENCOUNTER — Emergency Department (HOSPITAL_COMMUNITY)
Admission: EM | Admit: 2017-09-01 | Discharge: 2017-09-01 | Disposition: A | Discharge status: Home / Self Care | Payer: 59 | Attending: Emergency Medicine | Admitting: Emergency Medicine

## 2017-09-01 ENCOUNTER — Encounter (HOSPITAL_COMMUNITY): Payer: Self-pay

## 2017-09-01 DIAGNOSIS — Z87891 Personal history of nicotine dependence: Secondary | ICD-10-CM | POA: Insufficient documentation

## 2017-09-01 DIAGNOSIS — Z79899 Other long term (current) drug therapy: Secondary | ICD-10-CM | POA: Insufficient documentation

## 2017-09-01 DIAGNOSIS — K649 Unspecified hemorrhoids: Secondary | ICD-10-CM

## 2017-09-01 DIAGNOSIS — Z7982 Long term (current) use of aspirin: Secondary | ICD-10-CM | POA: Insufficient documentation

## 2017-09-01 DIAGNOSIS — I10 Essential (primary) hypertension: Secondary | ICD-10-CM | POA: Insufficient documentation

## 2017-09-01 DIAGNOSIS — N3001 Acute cystitis with hematuria: Secondary | ICD-10-CM

## 2017-09-01 LAB — CBC WITH DIFFERENTIAL/PLATELET
Basophils Absolute: 0 10*3/uL (ref 0.0–0.1)
Basophils Relative: 1 %
EOS PCT: 1 %
Eosinophils Absolute: 0 10*3/uL (ref 0.0–0.7)
HCT: 45.6 % (ref 39.0–52.0)
Hemoglobin: 15.3 g/dL (ref 13.0–17.0)
LYMPHS ABS: 2.2 10*3/uL (ref 0.7–4.0)
LYMPHS PCT: 51 %
MCH: 30.5 pg (ref 26.0–34.0)
MCHC: 33.6 g/dL (ref 30.0–36.0)
MCV: 91 fL (ref 78.0–100.0)
Monocytes Absolute: 0.2 10*3/uL (ref 0.1–1.0)
Monocytes Relative: 6 %
Neutro Abs: 1.8 10*3/uL (ref 1.7–7.7)
Neutrophils Relative %: 41 %
PLATELETS: 187 10*3/uL (ref 150–400)
RBC: 5.01 MIL/uL (ref 4.22–5.81)
RDW: 12.9 % (ref 11.5–15.5)
WBC: 4.3 10*3/uL (ref 4.0–10.5)

## 2017-09-01 LAB — COMPREHENSIVE METABOLIC PANEL
ALBUMIN: 3.9 g/dL (ref 3.5–5.0)
ALT: 12 U/L — ABNORMAL LOW (ref 17–63)
AST: 21 U/L (ref 15–41)
Alkaline Phosphatase: 125 U/L (ref 38–126)
Anion gap: 9 (ref 5–15)
BUN: 19 mg/dL (ref 6–20)
CHLORIDE: 105 mmol/L (ref 101–111)
CO2: 25 mmol/L (ref 22–32)
Calcium: 9.4 mg/dL (ref 8.9–10.3)
Creatinine, Ser: 1.18 mg/dL (ref 0.61–1.24)
GFR calc Af Amer: >60 mL/min (ref >60)
GLUCOSE: 80 mg/dL (ref 65–99)
POTASSIUM: 3.3 mmol/L — AB (ref 3.5–5.1)
Sodium: 139 mmol/L (ref 135–145)
Total Bilirubin: 0.7 mg/dL (ref 0.3–1.2)
Total Protein: 6.7 g/dL (ref 6.5–8.1)

## 2017-09-01 LAB — URINALYSIS, ROUTINE W REFLEX MICROSCOPIC
BACTERIA UA: NONE SEEN
Bilirubin Urine: NEGATIVE
Glucose, UA: NEGATIVE mg/dL
KETONES UR: 20 mg/dL — AB
Leukocytes, UA: NEGATIVE
Nitrite: NEGATIVE
PROTEIN: 100 mg/dL — AB
Specific Gravity, Urine: 1.021 (ref 1.005–1.030)
pH: 6 (ref 5.0–8.0)

## 2017-09-01 LAB — POC OCCULT BLOOD, ED: Fecal Occult Bld: NEGATIVE

## 2017-09-01 MED ORDER — CEPHALEXIN 500 MG PO CAPS: 500.0000 mg | ORAL_CAPSULE | Freq: Four times a day (QID) | ORAL | 0 refills | Status: DC

## 2017-09-01 MED ORDER — DOCUSATE SODIUM 100 MG PO CAPS: 100.0000 mg | ORAL_CAPSULE | Freq: Two times a day (BID) | ORAL | 0 refills | Status: DC | PRN

## 2017-09-01 NOTE — ED Provider Notes (Signed)
[facility] EMERGENCY DEPARTMENT Provider Note   CSN: [id number] Arrival date & time: 09/01/17  1552    History   Chief Complaint Chief Complaint  Patient presents with  . Hematuria  . Rectal Pain    HPI Mason Morales is a 62 y.o. male with a history of hypertension, nephrolithiasis, DDD who presents the emergency department with complaints of hematuria that started today and rectal pain which has been ongoing for past 3 weeks status post injury.  Patient states that he noticed his urine appeared somewhat dark a few days ago, however today he noticed that it looked to have blood in it given red appearance.  He states he is having some associated urinary frequency, urgency, with minimal output in urination at times. Some dysuria as well. No other specific alleviating or aggravating factors.  Denies fever, chills, nausea, vomiting, abdominal pain, or testicular pain/swelling.  States he does not feel this is similar to previous kidney stones.  Regarding rectal pain.  Patient jumped off of a horse landing on his "tailbone" and hitting his head.  He was seen in the emergency department for the same day (08/07/2017).  He had fairly extensive work-up including CTs of the head, cervical spine, and abdomen/pelvis.  Bilateral hip x-rays and chest x-ray.  All imaging negative for acute traumatic injuries.  States he still having some discomfort to the tailbone region, he is additionally having rectal discomfort, he states "it feels like I have to push past something in my rectum when having a bowel movement".  He states that he has had decreased frequency in bowel movements, typically has 3 BMs per day, has been having 1 smaller BM daily recently. Denies blood in stool, diarrhea, or incontinence.  Denies numbness or weakness.  HPI  Past Medical History:  Diagnosis Date  . Allergic rhinitis, cause unspecified 10/26/2010  . Allergy   . DDD (degenerative disc disease), cervical  10/26/2010  . DDD (degenerative disc disease), lumbar 10/26/2010  . Depression   . Generalized headaches   . GERD (gastroesophageal reflux disease)   . HTN (hypertension) 10/26/2010  . Hypertension   . Kidney stones     Patient Active Problem List   Diagnosis Date Noted  . Left lumbar radiculopathy 03/26/2017  . Acute sinus infection 12/14/2016  . Pelvic pain 08/24/2016  . Left cervical radiculopathy 05/20/2015  . Anxiety state 05/20/2015  . Back pain 03/18/2012  . GERD (gastroesophageal reflux disease) 10/26/2010  . Allergic rhinitis 10/26/2010  . HTN (hypertension) 10/26/2010  . Kidney stones 10/26/2010  . DDD (degenerative disc disease), cervical 10/26/2010  . DDD (degenerative disc disease), lumbar 10/26/2010  . Chest pain 10/26/2010  . Left shoulder pain 10/26/2010  . Encounter for well adult exam with abnormal findings 10/26/2010    Past Surgical History:  Procedure Laterality Date  . MOUTH SURGERY  at 62 yo after horse accident        Home Medications    Prior to Admission medications   Medication Sig Start Date End Date Taking? Authorizing Provider  acetaminophen (TYLENOL) 325 MG tablet Take 2 tablets (650 mg total) by mouth every 6 (six) hours as needed. Do not take more than  of tylenol per day 08/07/17   Couture, Cortni S, PA-C  amLODipine (NORVASC) 10 MG tablet TAKE ONE TABLET BY MOUTH DAILY 09/21/16   Corwin Levins, MD  aspirin 81 MG EC tablet Take 1 tablet (81 mg total) by mouth daily. Swallow whole. Patient taking differently:  Take 81 mg by mouth once a week. Swallow whole. 03/18/12   Corwin Levins, MD  citalopram (CELEXA) 20 MG tablet TAKE 1 TABLET BY MOUTH EVERY DAY 09/12/16   Corwin Levins, MD  cyclobenzaprine (FLEXERIL) 5 MG tablet Take 1 tablet (5 mg total) by mouth 2 (two) times daily as needed for muscle spasms. 08/07/17   Couture, Cortni S, PA-C  emtricitabine-tenofovir (TRUVADA) 200-300 MG tablet Patient on [id number] study. This may be placebo. DO NOT  FILL. This is a study provided medication. 11/08/16   Randall Hiss, MD  gabapentin (NEURONTIN) 100 MG capsule TAKE TWO CAPSULES BY MOUTH THREE TIMES A DAY 07/01/17   Corwin Levins, MD  meloxicam [facility]) 15 MG tablet TAKE ONE TABLET BY MOUTH DAILY 02/16/17   Corwin Levins, MD  methocarbamol (ROBAXIN) 500 MG tablet Take 500 mg by mouth as needed for muscle spasms.    [provider]  omeprazole (PRILOSEC) 20 MG capsule Take 1 capsule (20 mg total) by mouth daily as needed (for heartburn). 08/16/16   Corwin Levins, MD  traMADol (ULTRAM) 50 MG tablet Take 1 tablet (50 mg total) by mouth every 6 (six) hours as needed for severe pain. 03/26/17   Corwin Levins, MD    Family History Family History  Problem Relation Age of Onset  . Hyperlipidemia Other   . Heart disease Other   . Stroke Other   . Hypertension Other   . Diabetes Other   . Cancer Other        prostate cancer  . Diabetes Other   . Alcohol abuse Other   . Cancer Other        breast cancer  . Stroke Other   . Colon cancer Neg Hx   . Esophageal cancer Neg Hx   . Pancreatic cancer Neg Hx   . Rectal cancer Neg Hx   . Stomach cancer Neg Hx     Social History Social History   Tobacco Use  . Smoking status: Former Smoker    Last attempt to quit: 03/20/2014    Years since quitting: 3.4  . Smokeless tobacco: Never Used  Substance Use Topics  . Alcohol use: Yes    Alcohol/week: 3.6 oz    Types: 6 Cans of beer per week    Comment: occ  . Drug use: Yes    Types: Marijuana    Comment: THC-daily     Allergies   Isovue [iopamidol] and Sulfa antibiotics   Review of Systems Review of Systems  Constitutional: Negative for chills and fever.  Respiratory: Negative for shortness of breath.   Cardiovascular: Negative for chest pain.  Gastrointestinal: Positive for constipation and rectal pain. Negative for abdominal pain, blood in stool, diarrhea, nausea and vomiting.  Genitourinary: Positive for frequency,  hematuria and urgency. Negative for discharge, dysuria, scrotal swelling and testicular pain.  Musculoskeletal:       Positive for pain in tailbone region  Neurological: Negative for weakness and numbness.  All other systems reviewed and are negative.    Physical Exam Updated Vital Signs BP (!) 157/94   Pulse 72   Temp 98 F (36.7 C) (Oral)   Resp 16   Ht 5\' 9"  (1.753 m)   Wt 74.8 kg (165 lb)   SpO2 99%   BMI 24.37 kg/m   Physical Exam  Constitutional: He appears well-developed and well-nourished. No distress.  HENT:  Head: Normocephalic and atraumatic.  Eyes: Conjunctivae are normal. Right  eye exhibits no discharge. Left eye exhibits no discharge.  Cardiovascular: Normal rate and regular rhythm.  No murmur heard. Pulmonary/Chest: Breath sounds normal. No respiratory distress. He has no wheezes. He has no rales.  Abdominal: Soft. He exhibits no distension. There is tenderness (mild suprapubic and R groin region). There is no rebound, no guarding and no CVA tenderness. Hernia confirmed negative in the right inguinal area and confirmed negative in the left inguinal area.  Genitourinary: Rectal exam shows external hemorrhoid (two 1cm sized hemorrhoids present at 6:00 and 12:00, somewhat tender to palpation, do not appear thrombosed). Rectal exam shows no mass. Cremasteric reflex is present. Right testis shows no mass, no swelling and no tenderness. Left testis shows no mass, no swelling and no tenderness. Circumcised. No penile erythema or penile tenderness. No discharge found.  Genitourinary Comments: Patient uncomfortable with DRE, unable to perform thorough exam given clenching. No palpable masses or fluctuance in the rectal vault. RN Maralyn Sago present as chaperone.   Musculoskeletal:  No obvious deformity, appreciable swelling, erythema, ecchymosis, fluctuance, warmth, or induration. Back: Patient is diffusely tender in the lower lumbar region as well as the upper gluteal region.  No  point/focal bony tenderness.  Lymphadenopathy: No inguinal adenopathy noted on the right or left side.  Neurological: He is alert.  Clear speech.  Sensation grossly intact bilateral lower extremities.  5 out of 5 strength with plantar dorsiflexion bilaterally.  Skin: Skin is warm and dry. No rash noted.  Psychiatric: He has a normal mood and affect. His behavior is normal.  Nursing note and vitals reviewed.   ED Treatments / Results  Labs Results for orders placed or performed during the hospital encounter of 09/01/17  Urinalysis, Routine w reflex microscopic- may I&O cath if menses  Result Value Ref Range   Color, Urine AMBER (A) YELLOW   APPearance HAZY (A) CLEAR   Specific Gravity, Urine 1.021 1.005 - 1.030   pH 6.0 5.0 - 8.0   Glucose, UA NEGATIVE NEGATIVE mg/dL   Hgb urine dipstick LARGE (A) NEGATIVE   Bilirubin Urine NEGATIVE NEGATIVE   Ketones, ur 20 (A) NEGATIVE mg/dL   Protein, ur 161 (A) NEGATIVE mg/dL   Nitrite NEGATIVE NEGATIVE   Leukocytes, UA NEGATIVE NEGATIVE   RBC / HPF >50 (H) 0 - 5 RBC/hpf   WBC, UA 21-50 0 - 5 WBC/hpf   Bacteria, UA NONE SEEN NONE SEEN   Mucus PRESENT    Ca Oxalate Crys, UA PRESENT   Comprehensive metabolic panel  Result Value Ref Range   Sodium 139 135 - 145 mmol/L   Potassium 3.3 (L) 3.5 - 5.1 mmol/L   Chloride 105 101 - 111 mmol/L   CO2 25 22 - 32 mmol/L   Glucose, Bld 80 65 - 99 mg/dL   BUN 19 6 - 20 mg/dL   Creatinine, Ser 0.96 0.61 - 1.24 mg/dL   Calcium 9.4 8.9 - 04.5 mg/dL   Total Protein 6.7 6.5 - 8.1 g/dL   Albumin 3.9 3.5 - 5.0 g/dL   AST 21 15 - 41 U/L   ALT 12 (L) 17 - 63 U/L   Alkaline Phosphatase 125 38 - 126 U/L   Total Bilirubin 0.7 0.3 - 1.2 mg/dL   GFR calc non Af Amer >60 >60 mL/min   GFR calc Af Amer >60 >60 mL/min   Anion gap 9 5 - 15  CBC with Differential  Result Value Ref Range   WBC 4.3 4.0 - 10.5 K/uL  RBC 5.01 4.22 - 5.81 MIL/uL   Hemoglobin 15.3 13.0 - 17.0 g/dL   HCT 16.1 09.6 - 04.5 %   MCV 91.0  78.0 - 100.0 fL   MCH 30.5 26.0 - 34.0 pg   MCHC 33.6 30.0 - 36.0 g/dL   RDW 40.9 81.1 - 91.4 %   Platelets 187 150 - 400 K/uL   Neutrophils Relative % 41 %   Neutro Abs 1.8 1.7 - 7.7 K/uL   Lymphocytes Relative 51 %   Lymphs Abs 2.2 0.7 - 4.0 K/uL   Monocytes Relative 6 %   Monocytes Absolute 0.2 0.1 - 1.0 K/uL   Eosinophils Relative 1 %   Eosinophils Absolute 0.0 0.0 - 0.7 K/uL   Basophils Relative 1 %   Basophils Absolute 0.0 0.0 - 0.1 K/uL  POC occult blood, ED  Result Value Ref Range   Fecal Occult Bld NEGATIVE NEGATIVE   Dg Chest 2 View  Result Date: 08/07/2017 CLINICAL DATA:  Larey Seat from horse with chest pain EXAM: CHEST - 2 VIEW COMPARISON:  08/26/2015 FINDINGS: The heart size and mediastinal contours are within normal limits. Both lungs are clear. Degenerative osteophytes of the spine. IMPRESSION: No active cardiopulmonary disease. Electronically Signed   By: Jasmine Pang M.D.   On: 08/07/2017 01:25   Ct Head Wo Contrast  Result Date: 08/07/2017 CLINICAL DATA:  Status post fall off horse, with concern for head or cervical spine injury. EXAM: CT HEAD WITHOUT CONTRAST CT CERVICAL SPINE WITHOUT CONTRAST TECHNIQUE: Multidetector CT imaging of the head and cervical spine was performed following the standard protocol without intravenous contrast. Multiplanar CT image reconstructions of the cervical spine were also generated. COMPARISON:  MRI of the cervical spine performed 05/27/2015 FINDINGS: CT HEAD FINDINGS Brain: No evidence of acute infarction, hemorrhage, hydrocephalus, extra-axial collection or mass lesion/mass effect. 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 The posterior fossa, including the cerebellum, brainstem and fourth ventricle, is within normal limits. The third and lateral ventricles, and basal ganglia are unremarkable in appearance. No mass effect or midline shift is seen. Vascular: No hyperdense vessel or unexpected calcification. Skull: There is no  evidence of fracture; visualized osseous structures are unremarkable in appearance. Sinuses/Orbits: The orbits are within normal limits. The paranasal sinuses and mastoid air cells are well-aerated. Other: No significant soft tissue abnormalities are seen. CT CERVICAL SPINE FINDINGS Alignment: Normal. Skull base and vertebrae: No acute fracture. No primary bone lesion or focal pathologic process. Soft tissues and spinal canal: No prevertebral fluid or swelling. No visible canal hematoma. Disc levels: Minimal intervertebral disc space narrowing is noted at the lower cervical spine. Scattered anterior and posterior disc osteophyte complexes are noted along the cervical spine. Upper chest: The thyroid gland is unremarkable in appearance. The visualized lung apices are clear. Other: No additional soft tissue abnormalities are seen. IMPRESSION: 1. No evidence of traumatic intracranial injury or fracture. 2. No evidence of fracture or subluxation along the cervical spine. 3. 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 4. Minimal degenerative change along the cervical spine. Electronically Signed   By: Roanna Raider M.D.   On: 08/07/2017 01:51   Ct Cervical Spine Wo Contrast  Result Date: 08/07/2017 CLINICAL DATA:  Status post fall off horse, with concern for head or cervical spine injury. EXAM: CT HEAD WITHOUT CONTRAST CT CERVICAL SPINE WITHOUT CONTRAST TECHNIQUE: Multidetector CT imaging of the head and cervical spine was performed following the standard protocol without intravenous contrast. Multiplanar  CT image reconstructions of the cervical spine were also generated. COMPARISON:  MRI of the cervical spine performed 05/27/2015 FINDINGS: CT HEAD FINDINGS Brain: No evidence of acute infarction, hemorrhage, hydrocephalus, extra-axial collection or mass lesion/mass effect. 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 The posterior fossa, including the cerebellum, brainstem and fourth ventricle,  is within normal limits. The third and lateral ventricles, and basal ganglia are unremarkable in appearance. No mass effect or midline shift is seen. Vascular: No hyperdense vessel or unexpected calcification. Skull: There is no evidence of fracture; visualized osseous structures are unremarkable in appearance. Sinuses/Orbits: The orbits are within normal limits. The paranasal sinuses and mastoid air cells are well-aerated. Other: No significant soft tissue abnormalities are seen. CT CERVICAL SPINE FINDINGS Alignment: Normal. Skull base and vertebrae: No acute fracture. No primary bone lesion or focal pathologic process. Soft tissues and spinal canal: No prevertebral fluid or swelling. No visible canal hematoma. Disc levels: Minimal intervertebral disc space narrowing is noted at the lower cervical spine. Scattered anterior and posterior disc osteophyte complexes are noted along the cervical spine. Upper chest: The thyroid gland is unremarkable in appearance. The visualized lung apices are clear. Other: No additional soft tissue abnormalities are seen. IMPRESSION: 1. No evidence of traumatic intracranial injury or fracture. 2. No evidence of fracture or subluxation along the cervical spine. 3. 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 4. Minimal degenerative change along the cervical spine. Electronically Signed   By: Roanna Raider M.D.   On: 08/07/2017 01:51   Ct Abdomen Pelvis W Contrast  Result Date: 08/07/2017 CLINICAL DATA:  RIGHT lower quadrant pain after falling off a horse, blunt abdominal trauma with microscopic hematuria, history kidney stones, hypertension GERD, former smoker EXAM: CT ABDOMEN AND PELVIS WITH CONTRAST TECHNIQUE: Multidetector CT imaging of the abdomen and pelvis was performed using the standard protocol following bolus administration of intravenous contrast. Sagittal and coronal MPR images reconstructed from axial data set. CONTRAST:  ISOVUE-300 IOPAMIDOL (ISOVUE-300)  INJECTION 61% IV. No oral contrast was administered. COMPARISON:  10/25/2016 FINDINGS: Lower chest: Lung bases clear Hepatobiliary: Gallbladder and liver normal appearance Pancreas: Normal appearance Spleen: Normal appearance Adrenals/Urinary Tract: Tiny LEFT renal cyst. Adrenal glands, kidneys, ureters, and bladder otherwise normal appearance. No renal injury or perinephric stranding. Stomach/Bowel: Normal appendix. Stomach and bowel loops normal appearance. Vascular/Lymphatic: Vascular structures unremarkable. No adenopathy. Reproductive: Prostate gland and seminal vesicles unremarkable Other: No free air or free fluid. No hernia or inflammatory process. No obvious hematomas. Musculoskeletal: Scattered degenerative disc disease changes lumbar spine. No acute osseous findings. Central acquired spinal stenosis at L4-L5, multifactorial. IMPRESSION: No acute intra-abdominal or intrapelvic injuries identified. Degenerative disc disease changes lumbar spine with central acquired spinal stenosis at L4-L5. Electronically Signed   By: Ulyses Southward M.D.   On: 08/07/2017 11:58   Dg Hips Bilat With Pelvis 3-4 Views  Result Date: 08/07/2017 CLINICAL DATA:  Larey Seat from horse EXAM: DG HIP (WITH OR WITHOUT PELVIS) 3-4V BILAT COMPARISON:  None. FINDINGS: Pubic symphysis is intact. No fracture or malalignment. SI joints are non widened. There are mild degenerative changes of both hips IMPRESSION: No acute osseous abnormality. Electronically Signed   By: Jasmine Pang M.D.   On: 08/07/2017 01:27   EKG None  Radiology No results found.  Procedures Procedures (including critical care time)  Medications Ordered in ED Medications - No data to display   Initial Impression / Assessment and Plan / ED Course  I have  reviewed the triage vital signs and the nursing notes.  Pertinent labs & imaging results that were available during my care of the patient were reviewed by me and considered in my medical decision making (see  chart for details).  Patient presents with complaints of urinary symptoms that started today and rectal/lower back discomfort that has been ongoing for 3 weeks.  Patient nontoxic-appearing, in no apparent distress, initial vitals notable for tachycardia and hypertension, triage note does mention anxiety, tachycardia has normalized throughout remaining ER visit.  Hypertension has persisted, this is in the setting of patient having not taken his amlodipine today, he forgot to take it, do not suspect hypertensive emergency, patient aware of need for recheck.  Regarding rectal/lower back pain, patient with diffuse tenderness on exam to the lower back and central gluteal area.  He does have hemorrhoids on exam that do not appear thombosed.  No palpable mass or fluctuance to indicate abscess on DRE.  hemoccult negative.  Suspicious for constipation and subsequent hemorrhoids.  Dr Effie Shy recommends treat with stool softener, recommended sitz bath, and good hydration, I am in agreement with this.   Regarding urinary symptoms with mild suprapubic/groin tenderness on my exam, UA appears consistent with infection. Patient is afebrile, no CVA tenderness, no associated N/V to indicate pyelonephritis.  Will culture urine.  We will treat patient with Keflex.  Lab work reviewed.  No leukocytosis.  No anemia.  No significant electrolyte abnormalities, mild hypokalemia at 3.3, discussed diet supplementation with the patient.  No significant abnormalities in renal function or LFTs.  I discussed results, treatment plan, need for PCP follow-up, and return precautions with the patient. Provided opportunity for questions, patient confirmed understanding and is in agreement with plan.   Findings and plan of care discussed with supervising physician Dr. Effie Shy who personally evaluated and examined this patient and is in agreement with plan.   Vitals:   09/01/17 2130 09/01/17 2306  BP: (!) 153/99 133/90  Pulse: 72 67  Resp:   14  Temp:    SpO2: 97% 99%    Final Clinical Impressions(s) / ED Diagnoses   Final diagnoses:  Acute cystitis with hematuria  Hemorrhoids, unspecified hemorrhoid type    ED Discharge Orders        Ordered    cephALEXin (KEFLEX) 500 MG capsule  4 times daily     09/01/17 2302    docusate sodium (COLACE) 100 MG capsule  2 times daily PRN     09/01/17 2302       Ellenie Salome, Faith R, PA-C 09/02/17 0046    Mancel Bale, MD 09/02/17 [phone]

## 2017-09-01 NOTE — ED Provider Notes (Signed)
  Face-to-face evaluation   History: Patient presents for evaluation of low back pain, and hematuria.  Onset of low back pain after fall from horse 3 weeks ago.  Hematuria started 2 days ago.  Physical exam: Back mild low lumbar and sacral tenderness.  Abdomen nontender in the suprapubic region.  Mild right groin tenderness.  I reviewed the CT images, that were done on 08/07/2017 after the injury.  Medical screening examination/treatment/procedure(s) were conducted as a shared visit with non-physician practitioner(s) and myself.  I personally evaluated the patient during the encounter    Mancel Bale, MD 09/02/17 [phone]

## 2017-09-01 NOTE — ED Notes (Signed)
Bladder scan for 0 mL

## 2017-09-01 NOTE — Discharge Instructions (Addendum)
You were seen in the emergency department today for urinary symptoms and rectal pain.  Regarding your urinary symptoms it appears that you have a urinary tract infection.  We are sending you home with a prescription for Keflex, this is an antibiotic to treat the infection. Take this four times per day. Please take all of your antibiotics until finished. You may develop abdominal discomfort or diarrhea from the antibiotic.  You may help offset this with probiotics which you can buy at the store (ask your pharmacist if unable to find) or get probiotics in the form of eating yogurt. Do not eat or take the probiotics until 2 hours after your antibiotic. If you are unable to tolerate these side effects follow-up with your primary care provider or return to the emergency department.   If you begin to experience any blistering, rashes, swelling, or difficulty breathing seek medical care for evaluation of potentially more serious side effects.   Please be aware that this medication may interact with other medications you are taking, please be sure to discuss your medication list with your pharmacist.   We additionally saw that you have some hemorrhoids on exam. With your constipation we think this may be contributory to your discomfort. Utilize warm sitz baths. We are giving you a prescription for colace- take this twice per day as needed for constipation.   Again this medication has potential side effects and can interact with your other medicines so be sure to discuss this with your pharmacist.   Follow-up with your primary care provider in 3 days for reevaluation.  Return to the ER anytime for any new or worsening symptoms including but not limited to fever, worsening discomfort, inability to keep fluids down, or any other concerns.  Additionally your blood pressure was elevated in the emergency department today.  Be sure to follow-up with your primary care provider within 1 week for recheck of your blood  pressure.  Be sure to take your blood pressure medication as prescribed. Vitals:   09/01/17 2045 09/01/17 2130  BP: (!) 157/94 (!) 153/99  Pulse: 72 72  Resp: 16   Temp:    SpO2: 99% 97%

## 2017-09-01 NOTE — ED Triage Notes (Signed)
Pt endorses hematuria since this morning with burning. Pt also complains of rectal pain since falling off of a horse where he was seen here for 08/07/17. Pt appears very anxious in triage.

## 2017-09-02 ENCOUNTER — Other Ambulatory Visit: Payer: Self-pay | Admitting: Internal Medicine

## 2017-09-03 LAB — URINE CULTURE: Culture: <10000 — AB

## 2017-09-04 ENCOUNTER — Encounter (INDEPENDENT_AMBULATORY_CARE_PROVIDER_SITE_OTHER): Payer: 59

## 2017-09-04 VITALS — BP 137/81 | HR 67 | Temp 97.5°F | Wt 159.8 lb

## 2017-09-04 DIAGNOSIS — Z006 Encounter for examination for normal comparison and control in clinical research program: Secondary | ICD-10-CM

## 2017-09-04 NOTE — Progress Notes (Signed)
Participant here for safety visit following study injection. States all has been well other than the onset of constipation and what he believes to be related rectal pain as well as new onset hematuria for which he was seen in the ED on 5/12. He was diagnosed with hemorrhoids and cystitis. He started the keflex and states the hematuria has not been noted again since 5/13.  He is still tender from the horseback riding accident and taking associated pain meds when needed but much less frequently than immediately post.   He voiced concern for weight loss which has trended downward by 6.1kg since November 2018. He attributes stress from taking care of his elderly father as well as decreasing his food intake to decrease stools when he had the accident as it was painful to have a BM. We discussed healthy bowel patterns and he states that he will pick up some colace OTC and use as prescribed. He states that he is considering supplementing with ensure products and asked if that is advisable. I recommended he try especially if he feels that he is not able to get in enough food intake throughout the day. Patient verbalizes he will contact study site if he has any new issues or needs.

## 2017-09-05 LAB — CBC WITH DIFFERENTIAL/PLATELET
BASOS ABS: 20 {cells}/uL (ref 0–200)
Basophils Relative: 0.4 %
EOS ABS: 82 {cells}/uL (ref 15–500)
Eosinophils Relative: 1.6 %
HEMATOCRIT: 43.3 % (ref 38.5–50.0)
Hemoglobin: 14.9 g/dL (ref 13.2–17.1)
LYMPHS ABS: 2922 {cells}/uL (ref 850–3900)
MCH: 30.5 pg (ref 27.0–33.0)
MCHC: 34.4 g/dL (ref 32.0–36.0)
MCV: 88.5 fL (ref 80.0–100.0)
MPV: 9.7 fL (ref 7.5–12.5)
Monocytes Relative: 7 %
NEUTROS PCT: 33.7 %
Neutro Abs: 1719 cells/uL (ref 1500–7800)
PLATELETS: 199 10*3/uL (ref 140–400)
RBC: 4.89 10*6/uL (ref 4.20–5.80)
RDW: 12.7 % (ref 11.0–15.0)
Total Lymphocyte: 57.3 %
WBC mixed population: 357 cells/uL (ref 200–950)
WBC: 5.1 10*3/uL (ref 3.8–10.8)

## 2017-09-05 LAB — COMPREHENSIVE METABOLIC PANEL
AG Ratio: 1.4 (calc) (ref 1.0–2.5)
ALKALINE PHOSPHATASE (APISO): 132 U/L — AB (ref 40–115)
ALT: 10 U/L (ref 9–46)
AST: 16 U/L (ref 10–35)
Albumin: 4.2 g/dL (ref 3.6–5.1)
BUN: 13 mg/dL (ref 7–25)
CO2: 28 mmol/L (ref 20–32)
CREATININE: 1 mg/dL (ref 0.70–1.33)
Calcium: 9.3 mg/dL (ref 8.6–10.3)
Chloride: 104 mmol/L (ref 98–110)
Globulin: 2.9 g/dL (calc) (ref 1.9–3.7)
Glucose, Bld: 84 mg/dL (ref 65–99)
Potassium: 3.4 mmol/L — ABNORMAL LOW (ref 3.5–5.3)
Sodium: 141 mmol/L (ref 135–146)
Total Bilirubin: 0.5 mg/dL (ref 0.2–1.2)
Total Protein: 7.1 g/dL (ref 6.1–8.1)

## 2017-09-05 LAB — PHOSPHORUS: Phosphorus: 3.3 mg/dL (ref 2.5–4.5)

## 2017-09-05 LAB — LIPASE: LIPASE: 40 U/L (ref 7–60)

## 2017-09-05 LAB — HIV ANTIBODY (ROUTINE TESTING W REFLEX): HIV 1&2 Ab, 4th Generation: NONREACTIVE

## 2017-09-05 LAB — CK: CK TOTAL: 88 U/L (ref 44–196)

## 2017-09-05 LAB — AMYLASE: AMYLASE: 65 U/L (ref 21–101)

## 2017-10-15 ENCOUNTER — Encounter (INDEPENDENT_AMBULATORY_CARE_PROVIDER_SITE_OTHER): Payer: Self-pay | Admitting: *Deleted

## 2017-10-15 VITALS — BP 147/82 | HR 69 | Temp 97.9°F | Wt 161.8 lb

## 2017-10-15 DIAGNOSIS — Z006 Encounter for examination for normal comparison and control in clinical research program: Secondary | ICD-10-CM

## 2017-10-15 NOTE — Progress Notes (Signed)
Mason Morales is here for his week 49 HPTN visit. States that he has been having problems with constipation. Has tried laxatives with little relief. Plans to follow-up with his PCP. Returned #46 pills of oral study medication. Adherence at 81%. Reviewed strategies to help increase adherence. Currently using a pill box. Will try setting alarm on phone to assist with dose reminder. Denied any symptoms of acute HIV. Rapid HIV non-reactive. Cabotegravir/placebo injection given (R) buttock. Site unremarkable. He will return in 2 weeks for his next visit.

## 2017-10-16 LAB — CBC WITH DIFFERENTIAL/PLATELET
BASOS ABS: 31 {cells}/uL (ref 0–200)
Basophils Relative: 0.6 %
Eosinophils Absolute: 71 cells/uL (ref 15–500)
Eosinophils Relative: 1.4 %
HEMATOCRIT: 43.3 % (ref 38.5–50.0)
Hemoglobin: 14.7 g/dL (ref 13.2–17.1)
LYMPHS ABS: 2882 {cells}/uL (ref 850–3900)
MCH: 30.4 pg (ref 27.0–33.0)
MCHC: 33.9 g/dL (ref 32.0–36.0)
MCV: 89.6 fL (ref 80.0–100.0)
MONOS PCT: 4.9 %
MPV: 10.4 fL (ref 7.5–12.5)
NEUTROS PCT: 36.6 %
Neutro Abs: 1867 cells/uL (ref 1500–7800)
Platelets: 201 10*3/uL (ref 140–400)
RBC: 4.83 10*6/uL (ref 4.20–5.80)
RDW: 12.8 % (ref 11.0–15.0)
TOTAL LYMPHOCYTE: 56.5 %
WBC mixed population: 250 cells/uL (ref 200–950)
WBC: 5.1 10*3/uL (ref 3.8–10.8)

## 2017-10-16 LAB — COMPREHENSIVE METABOLIC PANEL
AG Ratio: 1.5 (calc) (ref 1.0–2.5)
ALT: 9 U/L (ref 9–46)
AST: 15 U/L (ref 10–35)
Albumin: 4.2 g/dL (ref 3.6–5.1)
Alkaline phosphatase (APISO): 93 U/L (ref 40–115)
BILIRUBIN TOTAL: 0.3 mg/dL (ref 0.2–1.2)
BUN: 11 mg/dL (ref 7–25)
CALCIUM: 9.4 mg/dL (ref 8.6–10.3)
CO2: 26 mmol/L (ref 20–32)
Chloride: 104 mmol/L (ref 98–110)
Creat: 1.17 mg/dL (ref 0.70–1.33)
Globulin: 2.8 g/dL (calc) (ref 1.9–3.7)
Glucose, Bld: 119 mg/dL — ABNORMAL HIGH (ref 65–99)
Potassium: 3.2 mmol/L — ABNORMAL LOW (ref 3.5–5.3)
SODIUM: 139 mmol/L (ref 135–146)
TOTAL PROTEIN: 7 g/dL (ref 6.1–8.1)

## 2017-10-16 LAB — HIV ANTIBODY (ROUTINE TESTING W REFLEX): HIV 1&2 Ab, 4th Generation: NONREACTIVE

## 2017-10-16 LAB — CK: CK TOTAL: 61 U/L (ref 44–196)

## 2017-10-16 LAB — AMYLASE: AMYLASE: 68 U/L (ref 21–101)

## 2017-10-16 LAB — LIPASE: Lipase: 46 U/L (ref 7–60)

## 2017-10-16 LAB — PHOSPHORUS: PHOSPHORUS: 3.1 mg/dL (ref 2.5–4.5)

## 2017-10-27 ENCOUNTER — Other Ambulatory Visit: Payer: Self-pay | Admitting: Internal Medicine

## 2017-10-29 ENCOUNTER — Encounter (INDEPENDENT_AMBULATORY_CARE_PROVIDER_SITE_OTHER): Payer: Self-pay | Admitting: *Deleted

## 2017-10-29 VITALS — BP 151/86 | HR 80 | Temp 97.6°F | Wt 160.8 lb

## 2017-10-29 DIAGNOSIS — Z006 Encounter for examination for normal comparison and control in clinical research program: Secondary | ICD-10-CM

## 2017-10-29 NOTE — Progress Notes (Signed)
Mason Morales is here for his week 51 HPTN 083 visit. Denied any injection site reactions. Continues to have problems with constipation. Has increase water intact and been eating yogurt with probiotics and fresh fruits. Feels like this has helped some. Will be starting a new job in a few weeks which he is excited about. He will return in August for his next injection visit.

## 2017-10-30 LAB — PHOSPHORUS: Phosphorus: 2.9 mg/dL (ref 2.5–4.5)

## 2017-10-30 LAB — LIPASE: LIPASE: 43 U/L (ref 7–60)

## 2017-10-30 LAB — COMPREHENSIVE METABOLIC PANEL
AG RATIO: 1.7 (calc) (ref 1.0–2.5)
ALBUMIN MSPROF: 4.5 g/dL (ref 3.6–5.1)
ALT: 8 U/L — AB (ref 9–46)
AST: 15 U/L (ref 10–35)
Alkaline phosphatase (APISO): 99 U/L (ref 40–115)
BILIRUBIN TOTAL: 0.6 mg/dL (ref 0.2–1.2)
BUN: 21 mg/dL (ref 7–25)
CHLORIDE: 103 mmol/L (ref 98–110)
CO2: 28 mmol/L (ref 20–32)
CREATININE: 1.24 mg/dL (ref 0.70–1.25)
Calcium: 9.5 mg/dL (ref 8.6–10.3)
Globulin: 2.7 g/dL (calc) (ref 1.9–3.7)
Glucose, Bld: 95 mg/dL (ref 65–99)
Potassium: 3.7 mmol/L (ref 3.5–5.3)
Sodium: 139 mmol/L (ref 135–146)
TOTAL PROTEIN: 7.2 g/dL (ref 6.1–8.1)

## 2017-10-30 LAB — CBC WITH DIFFERENTIAL/PLATELET
BASOS ABS: 20 {cells}/uL (ref 0–200)
BASOS PCT: 0.4 %
EOS PCT: 1 %
Eosinophils Absolute: 49 cells/uL (ref 15–500)
HEMATOCRIT: 43.8 % (ref 38.5–50.0)
HEMOGLOBIN: 15 g/dL (ref 13.2–17.1)
LYMPHS ABS: 2759 {cells}/uL (ref 850–3900)
MCH: 30.7 pg (ref 27.0–33.0)
MCHC: 34.2 g/dL (ref 32.0–36.0)
MCV: 89.8 fL (ref 80.0–100.0)
MONOS PCT: 8.2 %
MPV: 10.3 fL (ref 7.5–12.5)
NEUTROS ABS: 1671 {cells}/uL (ref 1500–7800)
Neutrophils Relative %: 34.1 %
Platelets: 195 10*3/uL (ref 140–400)
RBC: 4.88 10*6/uL (ref 4.20–5.80)
RDW: 12.7 % (ref 11.0–15.0)
Total Lymphocyte: 56.3 %
WBC mixed population: 402 cells/uL (ref 200–950)
WBC: 4.9 10*3/uL (ref 3.8–10.8)

## 2017-10-30 LAB — CK: Total CK: 105 U/L (ref 44–196)

## 2017-10-30 LAB — AMYLASE: Amylase: 73 U/L (ref 21–101)

## 2017-10-30 LAB — HIV ANTIBODY (ROUTINE TESTING W REFLEX): HIV 1&2 Ab, 4th Generation: NONREACTIVE

## 2017-12-16 ENCOUNTER — Encounter (INDEPENDENT_AMBULATORY_CARE_PROVIDER_SITE_OTHER): Payer: Self-pay

## 2017-12-16 VITALS — BP 137/79 | HR 72 | Temp 97.6°F | Wt 164.5 lb

## 2017-12-16 DIAGNOSIS — Z006 Encounter for examination for normal comparison and control in clinical research program: Secondary | ICD-10-CM

## 2017-12-17 LAB — CBC WITH DIFFERENTIAL/PLATELET
Basophils Absolute: 29 cells/uL (ref 0–200)
Basophils Relative: 0.6 %
EOS PCT: 1.4 %
Eosinophils Absolute: 69 cells/uL (ref 15–500)
HCT: 41.9 % (ref 38.5–50.0)
Hemoglobin: 14.2 g/dL (ref 13.2–17.1)
LYMPHS ABS: 2587 {cells}/uL (ref 850–3900)
MCH: 30.1 pg (ref 27.0–33.0)
MCHC: 33.9 g/dL (ref 32.0–36.0)
MCV: 89 fL (ref 80.0–100.0)
MPV: 10.1 fL (ref 7.5–12.5)
Monocytes Relative: 6.7 %
NEUTROS PCT: 38.5 %
Neutro Abs: 1887 cells/uL (ref 1500–7800)
PLATELETS: 196 10*3/uL (ref 140–400)
RBC: 4.71 10*6/uL (ref 4.20–5.80)
RDW: 12.8 % (ref 11.0–15.0)
Total Lymphocyte: 52.8 %
WBC mixed population: 328 cells/uL (ref 200–950)
WBC: 4.9 10*3/uL (ref 3.8–10.8)

## 2017-12-17 LAB — COMPREHENSIVE METABOLIC PANEL
AG Ratio: 1.2 (calc) (ref 1.0–2.5)
ALBUMIN MSPROF: 4.1 g/dL (ref 3.6–5.1)
ALKALINE PHOSPHATASE (APISO): 95 U/L (ref 40–115)
ALT: 9 U/L (ref 9–46)
AST: 16 U/L (ref 10–35)
BILIRUBIN TOTAL: 0.5 mg/dL (ref 0.2–1.2)
BUN: 18 mg/dL (ref 7–25)
CALCIUM: 9.3 mg/dL (ref 8.6–10.3)
CHLORIDE: 104 mmol/L (ref 98–110)
CO2: 23 mmol/L (ref 20–32)
Creat: 1.21 mg/dL (ref 0.70–1.25)
GLOBULIN: 3.3 g/dL (ref 1.9–3.7)
Glucose, Bld: 89 mg/dL (ref 65–99)
POTASSIUM: 3.9 mmol/L (ref 3.5–5.3)
SODIUM: 138 mmol/L (ref 135–146)
TOTAL PROTEIN: 7.4 g/dL (ref 6.1–8.1)

## 2017-12-17 LAB — LIPID PANEL
Cholesterol: 135 mg/dL (ref <200)
HDL: 51 mg/dL (ref >40)
LDL Cholesterol (Calc): 68 mg/dL (calc)
NON-HDL CHOLESTEROL (CALC): 84 mg/dL (ref <130)
Total CHOL/HDL Ratio: 2.6 (calc) (ref <5.0)
Triglycerides: 84 mg/dL (ref <150)

## 2017-12-17 LAB — HEPATITIS C ANTIBODY
Hepatitis C Ab: NONREACTIVE
SIGNAL TO CUT-OFF: 0.18 (ref <1.00)

## 2017-12-17 LAB — CK: Total CK: 144 U/L (ref 44–196)

## 2017-12-17 LAB — URINALYSIS
Bilirubin Urine: NEGATIVE
Glucose, UA: NEGATIVE
HGB URINE DIPSTICK: NEGATIVE
KETONES UR: NEGATIVE
Leukocytes, UA: NEGATIVE
NITRITE: NEGATIVE
PROTEIN: NEGATIVE
Specific Gravity, Urine: 1.01 (ref 1.001–1.03)
pH: 6 (ref 5.0–8.0)

## 2017-12-17 LAB — RPR TITER

## 2017-12-17 LAB — AMYLASE: Amylase: 66 U/L (ref 21–101)

## 2017-12-17 LAB — C. TRACHOMATIS/N. GONORRHOEAE RNA
C. TRACHOMATIS RNA, TMA: NOT DETECTED
N. GONORRHOEAE RNA, TMA: NOT DETECTED

## 2017-12-17 LAB — RPR: RPR: REACTIVE — AB

## 2017-12-17 LAB — PHOSPHORUS: Phosphorus: 3.1 mg/dL (ref 2.5–4.5)

## 2017-12-17 LAB — LIPASE: Lipase: 33 U/L (ref 7–60)

## 2017-12-17 LAB — FLUORESCENT TREPONEMAL AB(FTA)-IGG-BLD: FLUORESCENT TREPONEMAL ABS: REACTIVE — AB

## 2017-12-17 LAB — HIV ANTIBODY (ROUTINE TESTING W REFLEX): HIV 1&2 Ab, 4th Generation: NONREACTIVE

## 2017-12-17 NOTE — Progress Notes (Signed)
Participant here for [id number] Week 57 visit. No new concerns voiced. Rapid HIV non-reactive. Participant with ongoing constipation and right sided abdominal pain with radiation to the central abdomen and back at times. He states the pain is sharp and does often become exacerbated by eating. Encouraged him to speak with PCP. He states he just started a new job and his insurance will be valid soon so he will be making an appointment. He was encouraged to seek emergency care with any exacerbation that does not resolve or seems worse than he typically experiences. He is otherwise well. Experiencing some stress from caring for his father who has dementia and other comorbities. He has little support form his siblings. He states he knows this has caused him to miss a few doses of study medication. He received a new dispense of oral IP and IM injection of cabotegravir/placebo in the right glute without issue. Participant is scheduled for study  follow up on 9/6.

## 2017-12-19 LAB — CT/NG RNA, TMA RECTAL

## 2017-12-26 ENCOUNTER — Other Ambulatory Visit: Payer: Self-pay

## 2017-12-26 ENCOUNTER — Emergency Department (HOSPITAL_COMMUNITY): Payer: Self-pay

## 2017-12-26 ENCOUNTER — Encounter (HOSPITAL_COMMUNITY): Payer: Self-pay | Admitting: Emergency Medicine

## 2017-12-26 ENCOUNTER — Emergency Department (HOSPITAL_COMMUNITY)
Admission: EM | Admit: 2017-12-26 | Discharge: 2017-12-26 | Disposition: A | Discharge status: Home / Self Care | Payer: Self-pay | Attending: Emergency Medicine | Admitting: Emergency Medicine

## 2017-12-26 DIAGNOSIS — K5900 Constipation, unspecified: Secondary | ICD-10-CM | POA: Insufficient documentation

## 2017-12-26 DIAGNOSIS — R1084 Generalized abdominal pain: Secondary | ICD-10-CM

## 2017-12-26 DIAGNOSIS — Z87891 Personal history of nicotine dependence: Secondary | ICD-10-CM | POA: Insufficient documentation

## 2017-12-26 DIAGNOSIS — Z7982 Long term (current) use of aspirin: Secondary | ICD-10-CM | POA: Insufficient documentation

## 2017-12-26 DIAGNOSIS — Z79899 Other long term (current) drug therapy: Secondary | ICD-10-CM | POA: Insufficient documentation

## 2017-12-26 DIAGNOSIS — I10 Essential (primary) hypertension: Secondary | ICD-10-CM | POA: Insufficient documentation

## 2017-12-26 LAB — URINALYSIS, ROUTINE W REFLEX MICROSCOPIC
BILIRUBIN URINE: NEGATIVE
Glucose, UA: NEGATIVE mg/dL
Hgb urine dipstick: NEGATIVE
KETONES UR: NEGATIVE mg/dL
LEUKOCYTES UA: NEGATIVE
NITRITE: NEGATIVE
PROTEIN: NEGATIVE mg/dL
Specific Gravity, Urine: 1.021 (ref 1.005–1.030)
pH: 5 (ref 5.0–8.0)

## 2017-12-26 LAB — CBC
HCT: 46.3 % (ref 39.0–52.0)
Hemoglobin: 15 g/dL (ref 13.0–17.0)
MCH: 30.2 pg (ref 26.0–34.0)
MCHC: 32.4 g/dL (ref 30.0–36.0)
MCV: 93.3 fL (ref 78.0–100.0)
PLATELETS: 196 10*3/uL (ref 150–400)
RBC: 4.96 MIL/uL (ref 4.22–5.81)
RDW: 12.5 % (ref 11.5–15.5)
WBC: 5.8 10*3/uL (ref 4.0–10.5)

## 2017-12-26 LAB — COMPREHENSIVE METABOLIC PANEL
ALK PHOS: 87 U/L (ref 38–126)
ALT: 13 U/L (ref 0–44)
AST: 22 U/L (ref 15–41)
Albumin: 3.8 g/dL (ref 3.5–5.0)
Anion gap: 9 (ref 5–15)
BILIRUBIN TOTAL: 0.9 mg/dL (ref 0.3–1.2)
BUN: 15 mg/dL (ref 6–20)
CALCIUM: 9.2 mg/dL (ref 8.9–10.3)
CO2: 25 mmol/L (ref 22–32)
CREATININE: 1.17 mg/dL (ref 0.61–1.24)
Chloride: 104 mmol/L (ref 98–111)
GFR calc Af Amer: >60 mL/min (ref >60)
Glucose, Bld: 89 mg/dL (ref 70–99)
Potassium: 3.6 mmol/L (ref 3.5–5.1)
SODIUM: 138 mmol/L (ref 135–145)
TOTAL PROTEIN: 7.2 g/dL (ref 6.5–8.1)

## 2017-12-26 LAB — LIPASE, BLOOD: LIPASE: 79 U/L — AB (ref 11–51)

## 2017-12-26 MED ORDER — METOCLOPRAMIDE HCL 5 MG/ML IJ SOLN
10.0000 mg | Freq: Once | INTRAMUSCULAR | Status: AC
  Administered 2017-12-26: 10 mg via INTRAVENOUS
  Filled 2017-12-26: qty 2

## 2017-12-26 MED ORDER — DICYCLOMINE HCL 20 MG PO TABS: 20.0000 mg | ORAL_TABLET | Freq: Two times a day (BID) | ORAL | 0 refills | Status: DC

## 2017-12-26 MED ORDER — DIPHENHYDRAMINE HCL 50 MG/ML IJ SOLN
12.5000 mg | Freq: Once | INTRAMUSCULAR | Status: AC
  Administered 2017-12-26: 12.5 mg via INTRAVENOUS
  Filled 2017-12-26: qty 1

## 2017-12-26 NOTE — ED Notes (Signed)
ED Provider at bedside. 

## 2017-12-26 NOTE — ED Triage Notes (Signed)
Pt reports upper abd pain and mid back pain that has been going on for the last 3 months. Pt reports N/V and passing gas. Pt reports irregular BMs. Last BM 2 days ago. Denies diarrhea. Denies injuries. Hx of GERD.

## 2017-12-26 NOTE — ED Provider Notes (Signed)
[facility] EMERGENCY DEPARTMENT Provider Note   CSN: [id number] Arrival date & time: 12/26/17  0040     History   Chief Complaint Chief Complaint  Patient presents with  . Abdominal Pain    HPI Mason Morales is a 62 y.o. male.  Patient presents with abdominal pain he states is periumbilical, but also extends to general abdomen at times. No aggravating or alleviating factors. It is not affected by eating or position. No chest pain, SoB, urinary symptoms. No fever or diaphoresis at any time. Symptoms started 3 months ago. During the episodes of pain, the intensity waxes and wanes. He has not taken anything for the pain. He has not seen his primary care physician for evaluation. No abdominal surgeries in the past. He takes omeprazole daily and states symptoms of reflux for which he takes this are dissimilar to current symptoms. Chart review reveals history of constipation. He states his last bowel movement was 3 days ago and he still has problems with regularity. No melena or hematochezia.   The history is provided by the patient. No language interpreter was used.  Abdominal Pain   Associated symptoms include nausea and constipation. Pertinent negatives include fever and vomiting.    Past Medical History:  Diagnosis Date  . Allergic rhinitis, cause unspecified 10/26/2010  . Allergy   . DDD (degenerative disc disease), cervical 10/26/2010  . DDD (degenerative disc disease), lumbar 10/26/2010  . Depression   . Generalized headaches   . GERD (gastroesophageal reflux disease)   . HTN (hypertension) 10/26/2010  . Hypertension   . Kidney stones     Patient Active Problem List   Diagnosis Date Noted  . Left lumbar radiculopathy 03/26/2017  . Acute sinus infection 12/14/2016  . Pelvic pain 08/24/2016  . Left cervical radiculopathy 05/20/2015  . Anxiety state 05/20/2015  . Back pain 03/18/2012  . GERD (gastroesophageal reflux disease) 10/26/2010  . Allergic rhinitis  10/26/2010  . HTN (hypertension) 10/26/2010  . Kidney stones 10/26/2010  . DDD (degenerative disc disease), cervical 10/26/2010  . DDD (degenerative disc disease), lumbar 10/26/2010  . Chest pain 10/26/2010  . Left shoulder pain 10/26/2010  . Encounter for well adult exam with abnormal findings 10/26/2010    Past Surgical History:  Procedure Laterality Date  . MOUTH SURGERY  at 62 yo after horse accident        Home Medications    Prior to Admission medications   Medication Sig Start Date End Date Taking? Authorizing Provider  amLODipine (NORVASC) 10 MG tablet TAKE ONE TABLET BY MOUTH DAILY 09/21/16  Yes Corwin Levins, MD  aspirin 81 MG EC tablet Take 1 tablet (81 mg total) by mouth daily. Swallow whole. 03/18/12  Yes Corwin Levins, MD  citalopram (CELEXA) 20 MG tablet TAKE 1 TABLET BY MOUTH EVERY DAY Patient taking differently: Take 20 mg by mouth daily.  09/02/17  Yes Corwin Levins, MD  emtricitabine-tenofovir (TRUVADA) 200-300 MG tablet Patient on [id number] study. This may be placebo. DO NOT FILL. This is a study provided medication. Patient taking differently: Take 1 tablet by mouth daily.  11/08/16  Yes Daiva Eves, Lisette Grinder, MD  gabapentin (NEURONTIN) 100 MG capsule TAKE TWO CAPSULES BY MOUTH THREE TIMES A DAY Patient taking differently: Take 200 mg by mouth 3 (three) times daily.  07/01/17  Yes Corwin Levins, MD  meloxicam (MOBIC) 15 MG tablet TAKE ONE TABLET BY MOUTH DAILY Patient taking differently: Take 15 mg by mouth daily.  02/16/17  Yes Corwin Levins, MD  omeprazole (PRILOSEC) 20 MG capsule Take 1 capsule (20 mg total) by mouth daily as needed (for heartburn). 08/16/16  Yes Corwin Levins, MD  acetaminophen (TYLENOL) 325 MG tablet Take 2 tablets (650 mg total) by mouth every 6 (six) hours as needed. Do not take more than 4000mg  of tylenol per day Patient not taking: Reported on 12/26/2017 08/07/17   Couture, Cortni S, PA-C  cephALEXin (KEFLEX) 500 MG capsule Take 1 capsule (500 mg  total) by mouth 4 (four) times daily. Patient not taking: Reported on 12/26/2017 09/01/17   Petrucelli, Pleas Koch, PA-C  cyclobenzaprine (FLEXERIL) 5 MG tablet Take 1 tablet (5 mg total) by mouth 2 (two) times daily as needed for muscle spasms. Patient not taking: Reported on 12/26/2017 08/07/17   Couture, Cortni S, PA-C  docusate sodium (COLACE) 100 MG capsule Take 1 capsule (100 mg total) by mouth 2 (two) times daily as needed for mild constipation. Patient not taking: Reported on 12/26/2017 09/01/17   Petrucelli, Pleas Koch, PA-C  traMADol (ULTRAM) 50 MG tablet Take 1 tablet (50 mg total) by mouth every 6 (six) hours as needed for severe pain. Patient not taking: Reported on 12/26/2017 03/26/17   Corwin Levins, MD    Family History Family History  Problem Relation Age of Onset  . Hyperlipidemia Other   . Heart disease Other   . Stroke Other   . Hypertension Other   . Diabetes Other   . Cancer Other        prostate cancer  . Diabetes Other   . Alcohol abuse Other   . Cancer Other        breast cancer  . Stroke Other   . Colon cancer Neg Hx   . Esophageal cancer Neg Hx   . Pancreatic cancer Neg Hx   . Rectal cancer Neg Hx   . Stomach cancer Neg Hx     Social History Social History   Tobacco Use  . Smoking status: Former Smoker    Last attempt to quit: 03/20/2014    Years since quitting: 3.7  . Smokeless tobacco: Never Used  Substance Use Topics  . Alcohol use: Yes    Alcohol/week: 6.0 standard drinks    Types: 6 Cans of beer per week    Comment: occ  . Drug use: Yes    Types: Marijuana    Comment: THC-daily     Allergies   Isovue [iopamidol] and Sulfa antibiotics   Review of Systems Review of Systems  Constitutional: Negative for chills and fever.  Respiratory: Negative.  Negative for shortness of breath.   Cardiovascular: Negative.  Negative for chest pain and palpitations.  Gastrointestinal: Positive for abdominal pain, constipation and nausea. Negative for blood  in stool and vomiting.  Genitourinary: Negative.   Musculoskeletal: Negative.  Negative for back pain.  Skin: Negative.   Neurological: Negative.  Negative for weakness and light-headedness.     Physical Exam Updated Vital Signs BP 121/82 (BP Location: Left Arm)   Pulse 74   Temp 98.1 F (36.7 C) (Oral)   Resp 18   Ht 5\' 9"  (1.753 m)   Wt 77.1 kg   SpO2 99%   BMI 25.10 kg/m   Physical Exam  Constitutional: He appears well-developed and well-nourished. He does not appear ill. No distress.  HENT:  Head: Normocephalic.  Neck: Normal range of motion. Neck supple.  Cardiovascular: Normal rate and regular rhythm.  No murmur heard.  Pulmonary/Chest: Effort normal and breath sounds normal. He has no wheezes. He has no rhonchi. He has no rales. He exhibits no tenderness.  Abdominal: Soft. Bowel sounds are normal. There is generalized tenderness. There is no rigidity, no rebound and no guarding.  Musculoskeletal: Normal range of motion.  Neurological: He is alert. No cranial nerve deficit.  Skin: Skin is warm and dry. No rash noted.  Psychiatric: He has a normal mood and affect.  Nursing note and vitals reviewed.    ED Treatments / Results  Labs (all labs ordered are listed, but only abnormal results are displayed) Labs Reviewed  LIPASE, BLOOD - Abnormal; Notable for the following components:      Result Value   Lipase 79 (*)    All other components within normal limits  COMPREHENSIVE METABOLIC PANEL  CBC  URINALYSIS, ROUTINE W REFLEX MICROSCOPIC   Results for orders placed or performed during the hospital encounter of 12/26/17  Lipase, blood  Result Value Ref Range   Lipase 79 (H) 11 - 51 U/L  Comprehensive metabolic panel  Result Value Ref Range   Sodium 138 135 - 145 mmol/L   Potassium 3.6 3.5 - 5.1 mmol/L   Chloride 104 98 - 111 mmol/L   CO2 25 22 - 32 mmol/L   Glucose, Bld 89 70 - 99 mg/dL   BUN 15 6 - 20 mg/dL   Creatinine, Ser 1.61 0.61 - 1.24 mg/dL    Calcium 9.2 8.9 - 09.6 mg/dL   Total Protein 7.2 6.5 - 8.1 g/dL   Albumin 3.8 3.5 - 5.0 g/dL   AST 22 15 - 41 U/L   ALT 13 0 - 44 U/L   Alkaline Phosphatase 87 38 - 126 U/L   Total Bilirubin 0.9 0.3 - 1.2 mg/dL   GFR calc non Af Amer >60 >60 mL/min   GFR calc Af Amer >60 >60 mL/min   Anion gap 9 5 - 15  CBC  Result Value Ref Range   WBC 5.8 4.0 - 10.5 K/uL   RBC 4.96 4.22 - 5.81 MIL/uL   Hemoglobin 15.0 13.0 - 17.0 g/dL   HCT 04.5 40.9 - 81.1 %   MCV 93.3 78.0 - 100.0 fL   MCH 30.2 26.0 - 34.0 pg   MCHC 32.4 30.0 - 36.0 g/dL   RDW 91.4 78.2 - 95.6 %   Platelets 196 150 - 400 K/uL  Urinalysis, Routine w reflex microscopic  Result Value Ref Range   Color, Urine YELLOW YELLOW   APPearance CLEAR CLEAR   Specific Gravity, Urine 1.021 1.005 - 1.030   pH 5.0 5.0 - 8.0   Glucose, UA NEGATIVE NEGATIVE mg/dL   Hgb urine dipstick NEGATIVE NEGATIVE   Bilirubin Urine NEGATIVE NEGATIVE   Ketones, ur NEGATIVE NEGATIVE mg/dL   Protein, ur NEGATIVE NEGATIVE mg/dL   Nitrite NEGATIVE NEGATIVE   Leukocytes, UA NEGATIVE NEGATIVE    EKG None  Radiology No results found. Dg Abdomen Acute W/chest  Result Date: 12/26/2017 CLINICAL DATA:  Abdominal pain for couple of months, worse tonight. EXAM: DG ABDOMEN ACUTE W/ 1V CHEST COMPARISON:  Chest 08/07/2017 FINDINGS: Normal heart size and pulmonary vascularity. No focal airspace disease or consolidation in the lungs. No blunting of costophrenic angles. No pneumothorax. Mediastinal contours appear intact. Scattered gas and stool in the colon. No small or large bowel distention. No free intra-abdominal air. No abnormal air-fluid levels. No radiopaque stones. Degenerative changes in the spine. IMPRESSION: 1. No evidence of active pulmonary disease. 2.  Normal nonobstructive bowel gas pattern. Electronically Signed   By: Burman Nieves M.D.   On: 12/26/2017 02:59    Procedures Procedures (including critical care time)  Medications Ordered in  ED Medications  metoCLOPramide (REGLAN) injection 10 mg (has no administration in time range)     Initial Impression / Assessment and Plan / ED Course  I have reviewed the triage vital signs and the nursing notes.  Pertinent labs & imaging results that were available during my care of the patient were reviewed by me and considered in my medical decision making (see chart for details).     Patient presents with abdominal pain x 3 months.   Abdominal exam is benign - no peritoneal signs, diffusely tender, soft abdomen, +BS throughout. Labs are reassuring. No evidence of infection or indication of inflammation. His lipase is slightly elevated, however, he has no risk factors for pancreatitis and no tenderness focal to the LUQ.  IV Reglan provided with resolution of the patient's symptoms. Feel he can be discharged home. Will start Bentyl and refer to GI for further evaluation of ongoing/persistent abdominal pain as well as constipation. The patient is comfortable with discharge home.   Final Clinical Impressions(s) / ED Diagnoses   Final diagnoses:  None   1. Abdominal pain 2. Chronic constipation  ED Discharge Orders    None       Elpidio Anis, PA-C 12/26/17 1610    Derwood Kaplan, MD 12/26/17 0630

## 2017-12-26 NOTE — Discharge Instructions (Addendum)
Take Bentyl as directed. Follow up with gastroenterology for further evaluation of abdominal pain as well as for treatment of persistent constipation. Return here with any high fever, or for new concern.

## 2017-12-27 ENCOUNTER — Encounter (INDEPENDENT_AMBULATORY_CARE_PROVIDER_SITE_OTHER): Payer: Self-pay | Admitting: *Deleted

## 2017-12-27 VITALS — BP 122/75 | HR 79 | Temp 97.6°F | Wt 167.0 lb

## 2017-12-27 DIAGNOSIS — Z006 Encounter for examination for normal comparison and control in clinical research program: Secondary | ICD-10-CM

## 2017-12-27 NOTE — Progress Notes (Signed)
Mason Morales is here for his week 59 HPTN 083 visit. Seen yesterday in the [facility] for abdominal pain with vomiting. States that the vomiting has resolved and that the pain has improved but not completely resolved. Referred to [facility] gastroenterology. Plans to call for appointment today. He was also prescribed dicyclomine which he plans to get filled and start today. Encouraged him to avoid foods that may increase symptoms and to try eating small frequent meals. He will return in October for his next study visit.

## 2017-12-28 LAB — COMPREHENSIVE METABOLIC PANEL
AG Ratio: 1.3 (calc) (ref 1.0–2.5)
ALT: 11 U/L (ref 9–46)
AST: 18 U/L (ref 10–35)
Albumin: 4.4 g/dL (ref 3.6–5.1)
Alkaline phosphatase (APISO): 95 U/L (ref 40–115)
BILIRUBIN TOTAL: 0.6 mg/dL (ref 0.2–1.2)
BUN/Creatinine Ratio: 15 (calc) (ref 6–22)
BUN: 19 mg/dL (ref 7–25)
CALCIUM: 9.6 mg/dL (ref 8.6–10.3)
CO2: 27 mmol/L (ref 20–32)
Chloride: 103 mmol/L (ref 98–110)
Creat: 1.29 mg/dL — ABNORMAL HIGH (ref 0.70–1.25)
Globulin: 3.3 g/dL (calc) (ref 1.9–3.7)
Glucose, Bld: 101 mg/dL — ABNORMAL HIGH (ref 65–99)
Potassium: 3.9 mmol/L (ref 3.5–5.3)
Sodium: 141 mmol/L (ref 135–146)
Total Protein: 7.7 g/dL (ref 6.1–8.1)

## 2017-12-28 LAB — CBC WITH DIFFERENTIAL/PLATELET
BASOS PCT: 0.7 %
Basophils Absolute: 32 cells/uL (ref 0–200)
EOS ABS: 41 {cells}/uL (ref 15–500)
Eosinophils Relative: 0.9 %
HCT: 43.9 % (ref 38.5–50.0)
HEMOGLOBIN: 14.9 g/dL (ref 13.2–17.1)
Lymphs Abs: 2433 cells/uL (ref 850–3900)
MCH: 30.5 pg (ref 27.0–33.0)
MCHC: 33.9 g/dL (ref 32.0–36.0)
MCV: 90 fL (ref 80.0–100.0)
MPV: 9.7 fL (ref 7.5–12.5)
Monocytes Relative: 8.6 %
NEUTROS ABS: 1697 {cells}/uL (ref 1500–7800)
Neutrophils Relative %: 36.9 %
Platelets: 208 10*3/uL (ref 140–400)
RBC: 4.88 10*6/uL (ref 4.20–5.80)
RDW: 12.8 % (ref 11.0–15.0)
Total Lymphocyte: 52.9 %
WBC: 4.6 10*3/uL (ref 3.8–10.8)
WBCMIX: 396 {cells}/uL (ref 200–950)

## 2017-12-28 LAB — AMYLASE: Amylase: 55 U/L (ref 21–101)

## 2017-12-28 LAB — HIV ANTIBODY (ROUTINE TESTING W REFLEX): HIV: NONREACTIVE

## 2017-12-28 LAB — LIPASE: Lipase: 31 U/L (ref 7–60)

## 2017-12-28 LAB — CK: CK TOTAL: 97 U/L (ref 44–196)

## 2017-12-28 LAB — PHOSPHORUS: PHOSPHORUS: 2.9 mg/dL (ref 2.5–4.5)

## 2017-12-29 LAB — CT/NG RNA, TMA RECTAL
Chlamydia Trachomatis RNA: NOT DETECTED
Neisseria Gonorrhoeae RNA: NOT DETECTED

## 2018-01-31 ENCOUNTER — Other Ambulatory Visit: Payer: Self-pay | Admitting: Internal Medicine

## 2018-01-31 VITALS — BP 121/77 | HR 71 | Temp 97.3°F | Wt 168.0 lb

## 2018-01-31 DIAGNOSIS — Z006 Encounter for examination for normal comparison and control in clinical research program: Secondary | ICD-10-CM

## 2018-01-31 NOTE — Research (Signed)
Participant here for [id number] study visit week 65. He is doing well, feeling much improved since starting his new job and taking time for himself. He has a follow up scheduled with GI soon to evaluate the ongoing abdominal pain. Compliance was calculated at 86.3% with oral IP, states he knows it could improve and that he has been so busy caring for his father that he often forgets his own medication. Today's rapid HIV non-reactive. Participant received new dispense of oral IP and IM injection of cabotegravir/placebo in the right glute. He is scheduled for follow up on 10/29.

## 2018-02-01 LAB — COMPREHENSIVE METABOLIC PANEL
AG RATIO: 1.5 (calc) (ref 1.0–2.5)
ALT: 12 U/L (ref 9–46)
AST: 18 U/L (ref 10–35)
Albumin: 4.4 g/dL (ref 3.6–5.1)
Alkaline phosphatase (APISO): 93 U/L (ref 40–115)
BILIRUBIN TOTAL: 0.4 mg/dL (ref 0.2–1.2)
BUN: 13 mg/dL (ref 7–25)
CALCIUM: 9.3 mg/dL (ref 8.6–10.3)
CO2: 29 mmol/L (ref 20–32)
Chloride: 102 mmol/L (ref 98–110)
Creat: 1.08 mg/dL (ref 0.70–1.25)
GLUCOSE: 103 mg/dL — AB (ref 65–99)
Globulin: 2.9 g/dL (calc) (ref 1.9–3.7)
Potassium: 4.1 mmol/L (ref 3.5–5.3)
SODIUM: 140 mmol/L (ref 135–146)
TOTAL PROTEIN: 7.3 g/dL (ref 6.1–8.1)

## 2018-02-01 LAB — HIV ANTIBODY (ROUTINE TESTING W REFLEX): HIV: NONREACTIVE

## 2018-02-01 LAB — CBC WITH DIFFERENTIAL/PLATELET
Basophils Absolute: 41 cells/uL (ref 0–200)
Basophils Relative: 0.9 %
EOS ABS: 32 {cells}/uL (ref 15–500)
Eosinophils Relative: 0.7 %
HEMATOCRIT: 41.4 % (ref 38.5–50.0)
HEMOGLOBIN: 14.2 g/dL (ref 13.2–17.1)
Lymphs Abs: 2255 cells/uL (ref 850–3900)
MCH: 30.5 pg (ref 27.0–33.0)
MCHC: 34.3 g/dL (ref 32.0–36.0)
MCV: 89 fL (ref 80.0–100.0)
MONOS PCT: 6 %
MPV: 9.8 fL (ref 7.5–12.5)
NEUTROS ABS: 1904 {cells}/uL (ref 1500–7800)
Neutrophils Relative %: 42.3 %
Platelets: 217 10*3/uL (ref 140–400)
RBC: 4.65 10*6/uL (ref 4.20–5.80)
RDW: 12.5 % (ref 11.0–15.0)
Total Lymphocyte: 50.1 %
WBC mixed population: 270 cells/uL (ref 200–950)
WBC: 4.5 10*3/uL (ref 3.8–10.8)

## 2018-02-01 LAB — AMYLASE: AMYLASE: 65 U/L (ref 21–101)

## 2018-02-01 LAB — PHOSPHORUS: Phosphorus: 2.7 mg/dL (ref 2.5–4.5)

## 2018-02-01 LAB — CK: Total CK: 135 U/L (ref 44–196)

## 2018-02-01 LAB — LIPASE: Lipase: 29 U/L (ref 7–60)

## 2018-02-03 ENCOUNTER — Other Ambulatory Visit: Payer: Self-pay | Admitting: Internal Medicine

## 2018-02-03 NOTE — Telephone Encounter (Signed)
Copied from CRM [id number]. Topic: Quick Communication - Rx Refill/Question >> Feb 03, 2018 10:03 AM Jay Schlichter wrote: Medication: amLODipine (NORVASC) 10 MG tablet  Has the patient contacted their pharmacy? Yes.   (Agent: If no, request that the patient contact the pharmacy for the refill.) (Agent: If yes, when and what did the pharmacy advise?) not on their list   Preferred Pharmacy (with phone number or street name): [facility] [address], [place] - [address] [phone] (Phone  Agent: Please be advised that RX refills may take up to 3 business days. We ask that you follow-up with your pharmacy.

## 2018-02-04 MED ORDER — AMLODIPINE BESYLATE 10 MG PO TABS: 10.0000 mg | ORAL_TABLET | Freq: Every day | ORAL | 0 refills | Status: DC

## 2018-02-04 NOTE — Telephone Encounter (Signed)
Attempted to contact pt to schedule office visit; last office visit 12/14/16; no upcoming visits noted; left message on voicemail [phone].  Requested Prescriptions  Pending Prescriptions Disp Refills  . amLODipine (NORVASC) 10 MG tablet 30 tablet 11    Sig: Take 1 tablet (10 mg total) by mouth daily.     Cardiovascular:  Calcium Channel Blockers Failed - 02/03/2018 12:21 PM      Failed - Valid encounter within last 6 months    Recent Outpatient Visits          10 months ago Left lumbar radiculopathy   [facility] -Clair Gulling, MD   1 year ago Encounter for well adult exam with abnormal findings   [facility] -Clair Gulling, MD   1 year ago Urinary frequency   [facility] -Clair Gulling, MD   2 years ago Encounter for well adult exam with abnormal findings   [facility] -Clair Gulling, MD   2 years ago Essential hypertension   [facility] -Clair Gulling, MD             Passed - Last BP in normal range    BP Readings from Last 1 Encounters:  01/31/18 121/77

## 2018-02-18 ENCOUNTER — Encounter (INDEPENDENT_AMBULATORY_CARE_PROVIDER_SITE_OTHER): Payer: Self-pay | Admitting: *Deleted

## 2018-02-18 VITALS — BP 124/70 | HR 73 | Temp 97.9°F | Wt 168.0 lb

## 2018-02-18 DIAGNOSIS — Z006 Encounter for examination for normal comparison and control in clinical research program: Secondary | ICD-10-CM

## 2018-02-18 NOTE — Research (Signed)
Mason Morales was here for the week 67 visit for [facility] 083. He continues to have abdominal discomfort from time to time in the midepigastric area and upper abdomen. When questioned he says he thinks it is worse when he eats. He had run out of his prilosec about 6 months ago. He is scheduled to see a GI MD next week for evaluation. He denies any problems with the injection or adherence issues. He will be returning in December for the next injection.

## 2018-02-19 LAB — CBC WITH DIFFERENTIAL/PLATELET
Basophils Absolute: 22 {cells}/uL (ref 0–200)
Basophils Relative: 0.5 %
Eosinophils Absolute: 22 {cells}/uL (ref 15–500)
Eosinophils Relative: 0.5 %
HCT: 39.7 % (ref 38.5–50.0)
Hemoglobin: 13.4 g/dL (ref 13.2–17.1)
Lymphs Abs: 1729 {cells}/uL (ref 850–3900)
MCH: 30.6 pg (ref 27.0–33.0)
MCHC: 33.8 g/dL (ref 32.0–36.0)
MCV: 90.6 fL (ref 80.0–100.0)
MPV: 9.7 fL (ref 7.5–12.5)
Monocytes Relative: 5.7 %
Neutro Abs: 2376 {cells}/uL (ref 1500–7800)
Neutrophils Relative %: 54 %
Platelets: 215 Thousand/uL (ref 140–400)
RBC: 4.38 Million/uL (ref 4.20–5.80)
RDW: 12.5 % (ref 11.0–15.0)
Total Lymphocyte: 39.3 %
WBC mixed population: 251 {cells}/uL (ref 200–950)
WBC: 4.4 Thousand/uL (ref 3.8–10.8)

## 2018-02-19 LAB — COMPREHENSIVE METABOLIC PANEL WITH GFR
AG Ratio: 1.7 (calc) (ref 1.0–2.5)
ALT: 9 U/L (ref 9–46)
AST: 18 U/L (ref 10–35)
Albumin: 4.5 g/dL (ref 3.6–5.1)
Alkaline phosphatase (APISO): 82 U/L (ref 40–115)
BUN: 18 mg/dL (ref 7–25)
CO2: 28 mmol/L (ref 20–32)
Calcium: 9.3 mg/dL (ref 8.6–10.3)
Chloride: 101 mmol/L (ref 98–110)
Creat: 1.15 mg/dL (ref 0.70–1.25)
Globulin: 2.7 g/dL (ref 1.9–3.7)
Glucose, Bld: 110 mg/dL — ABNORMAL HIGH (ref 65–99)
Potassium: 4.2 mmol/L (ref 3.5–5.3)
Sodium: 136 mmol/L (ref 135–146)
Total Bilirubin: 0.4 mg/dL (ref 0.2–1.2)
Total Protein: 7.2 g/dL (ref 6.1–8.1)

## 2018-02-19 LAB — PHOSPHORUS: Phosphorus: 2.7 mg/dL (ref 2.5–4.5)

## 2018-02-19 LAB — HIV ANTIBODY (ROUTINE TESTING W REFLEX): HIV 1&2 Ab, 4th Generation: NONREACTIVE

## 2018-02-19 LAB — CK: Total CK: 114 U/L (ref 44–196)

## 2018-02-19 LAB — LIPASE: Lipase: 27 U/L (ref 7–60)

## 2018-02-19 LAB — AMYLASE: AMYLASE: 52 U/L (ref 21–101)

## 2018-03-05 ENCOUNTER — Other Ambulatory Visit: Payer: Self-pay | Admitting: Internal Medicine

## 2018-03-11 ENCOUNTER — Other Ambulatory Visit: Payer: Self-pay | Admitting: Internal Medicine

## 2018-03-11 NOTE — Telephone Encounter (Signed)
Copied from CRM [id number]. Topic: General - Other >> Mar 11, 2018 11:12 AM Ronney LionArrington, Shykila A wrote: Medication: amLODipine (NORVASC) 10 MG tablet  Has the patient contacted their pharmacy? Yes  Preferred Pharmacy (with phone number or street name):   [facility] [address] [place], [place] [id number]  [phone] (Phone) [phone] (Fax)   Agent: Please be advised that RX refills may take up to 3 business days. We ask that you follow-up with your pharmacy.

## 2018-03-12 MED ORDER — AMLODIPINE BESYLATE 10 MG PO TABS: 10.0000 mg | ORAL_TABLET | Freq: Every day | ORAL | 0 refills | Status: DC

## 2018-03-12 NOTE — Telephone Encounter (Signed)
Courtesy refill  

## 2018-04-02 ENCOUNTER — Other Ambulatory Visit (INDEPENDENT_AMBULATORY_CARE_PROVIDER_SITE_OTHER): Payer: BLUE CROSS/BLUE SHIELD

## 2018-04-02 ENCOUNTER — Ambulatory Visit (INDEPENDENT_AMBULATORY_CARE_PROVIDER_SITE_OTHER): Payer: BLUE CROSS/BLUE SHIELD | Admitting: Internal Medicine

## 2018-04-02 ENCOUNTER — Encounter: Payer: Self-pay | Admitting: Internal Medicine

## 2018-04-02 VITALS — BP 132/84 | HR 69 | Temp 98.0°F | Ht 69.0 in | Wt 166.0 lb

## 2018-04-02 DIAGNOSIS — I10 Essential (primary) hypertension: Secondary | ICD-10-CM | POA: Diagnosis not present

## 2018-04-02 DIAGNOSIS — F411 Generalized anxiety disorder: Secondary | ICD-10-CM | POA: Diagnosis not present

## 2018-04-02 DIAGNOSIS — Z0000 Encounter for general adult medical examination without abnormal findings: Secondary | ICD-10-CM | POA: Diagnosis not present

## 2018-04-02 DIAGNOSIS — Z23 Encounter for immunization: Secondary | ICD-10-CM

## 2018-04-02 LAB — URINALYSIS, ROUTINE W REFLEX MICROSCOPIC
Bilirubin Urine: NEGATIVE
Hgb urine dipstick: NEGATIVE
Ketones, ur: NEGATIVE
Leukocytes, UA: NEGATIVE
Nitrite: NEGATIVE
Specific Gravity, Urine: 1.015 (ref 1.000–1.030)
Total Protein, Urine: NEGATIVE
Urine Glucose: NEGATIVE
Urobilinogen, UA: 0.2 (ref 0.0–1.0)
pH: 7 (ref 5.0–8.0)

## 2018-04-02 LAB — LIPID PANEL
Cholesterol: 153 mg/dL (ref 0–200)
HDL: 47.3 mg/dL (ref >39.00)
LDL Cholesterol: 78 mg/dL (ref 0–99)
NonHDL: 105.37
Total CHOL/HDL Ratio: 3
Triglycerides: 135 mg/dL (ref 0.0–149.0)
VLDL: 27 mg/dL (ref 0.0–40.0)

## 2018-04-02 LAB — CBC WITH DIFFERENTIAL/PLATELET
Basophils Absolute: 0.1 10*3/uL (ref 0.0–0.1)
Basophils Relative: 1.1 % (ref 0.0–3.0)
Eosinophils Absolute: 0 10*3/uL (ref 0.0–0.7)
Eosinophils Relative: 0.4 % (ref 0.0–5.0)
HCT: 44.4 % (ref 39.0–52.0)
Hemoglobin: 15.1 g/dL (ref 13.0–17.0)
Lymphocytes Relative: 52 % — ABNORMAL HIGH (ref 12.0–46.0)
Lymphs Abs: 2.6 10*3/uL (ref 0.7–4.0)
MCHC: 33.9 g/dL (ref 30.0–36.0)
MCV: 90.5 fl (ref 78.0–100.0)
Monocytes Absolute: 0.3 10*3/uL (ref 0.1–1.0)
Monocytes Relative: 6.1 % (ref 3.0–12.0)
Neutro Abs: 2 10*3/uL (ref 1.4–7.7)
Neutrophils Relative %: 40.4 % — ABNORMAL LOW (ref 43.0–77.0)
Platelets: 225 10*3/uL (ref 150.0–400.0)
RBC: 4.91 Mil/uL (ref 4.22–5.81)
RDW: 13.2 % (ref 11.5–15.5)
WBC: 5 10*3/uL (ref 4.0–10.5)

## 2018-04-02 LAB — TSH: TSH: 0.88 u[IU]/mL (ref 0.35–4.50)

## 2018-04-02 LAB — HEPATIC FUNCTION PANEL
ALBUMIN: 4.5 g/dL (ref 3.5–5.2)
ALT: 7 U/L (ref 0–53)
AST: 15 U/L (ref 0–37)
Alkaline Phosphatase: 82 U/L (ref 39–117)
Bilirubin, Direct: 0.1 mg/dL (ref 0.0–0.3)
Total Bilirubin: 0.5 mg/dL (ref 0.2–1.2)
Total Protein: 7.4 g/dL (ref 6.0–8.3)

## 2018-04-02 LAB — BASIC METABOLIC PANEL
BUN: 15 mg/dL (ref 6–23)
CALCIUM: 9.4 mg/dL (ref 8.4–10.5)
CO2: 28 mEq/L (ref 19–32)
Chloride: 103 mEq/L (ref 96–112)
Creatinine, Ser: 1.01 mg/dL (ref 0.40–1.50)
GFR: 96.76 mL/min (ref >60.00)
Glucose, Bld: 86 mg/dL (ref 70–99)
POTASSIUM: 3.6 meq/L (ref 3.5–5.1)
Sodium: 139 mEq/L (ref 135–145)

## 2018-04-02 LAB — PSA: PSA: 0.5 ng/mL (ref 0.10–4.00)

## 2018-04-02 MED ORDER — AMLODIPINE BESYLATE 10 MG PO TABS: 10.0000 mg | ORAL_TABLET | Freq: Every day | ORAL | 3 refills | Status: DC

## 2018-04-02 MED ORDER — CITALOPRAM HYDROBROMIDE 20 MG PO TABS: 20.0000 mg | ORAL_TABLET | Freq: Every day | ORAL | 3 refills | Status: DC

## 2018-04-02 MED ORDER — DICYCLOMINE HCL 20 MG PO TABS: 20.0000 mg | ORAL_TABLET | Freq: Two times a day (BID) | ORAL | 3 refills | Status: DC

## 2018-04-02 MED ORDER — MELOXICAM 15 MG PO TABS: 15.0000 mg | ORAL_TABLET | Freq: Every day | ORAL | 3 refills | Status: DC | PRN

## 2018-04-02 NOTE — Assessment & Plan Note (Signed)

## 2018-04-02 NOTE — Assessment & Plan Note (Signed)
stable overall by history and exam, recent data reviewed with pt, and pt to continue medical treatment as before,  to f/u any worsening symptoms or concerns  

## 2018-04-02 NOTE — Progress Notes (Signed)
Subjective:    Patient ID: Mason Morales, male    DOB: May 19, 1955, 62 y.o.   MRN: [id number]  HPI  Here for wellness and f/u;  Overall doing ok;  Pt denies Chest pain, worsening SOB, DOE, wheezing, orthopnea, PND, worsening LE edema, palpitations, dizziness or syncope.  Pt denies neurological change such as new headache, facial or extremity weakness.  Pt denies polydipsia, polyuria, or low sugar symptoms. Pt states overall good compliance with treatment and medications, good tolerability, and has been trying to follow appropriate diet.  Pt denies worsening depressive symptoms, suicidal ideation or panic. No fever, night sweats, wt loss, loss of appetite, or other constitutional symptoms.  Pt states good ability with ADL's, has low fall risk, home safety reviewed and adequate, no other significant changes in hearing or vision, and only occasionally active with exercise.  No longer takes the gabapentin or mobic for pain.  Ask for celexa for anxiety, though no recent panic attacks at least.  No other new complaints Past Medical History:  Diagnosis Date  . Allergic rhinitis, cause unspecified 10/26/2010  . Allergy   . DDD (degenerative disc disease), cervical 10/26/2010  . DDD (degenerative disc disease), lumbar 10/26/2010  . Depression   . Generalized headaches   . GERD (gastroesophageal reflux disease)   . HTN (hypertension) 10/26/2010  . Hypertension   . Kidney stones    Past Surgical History:  Procedure Laterality Date  . MOUTH SURGERY  at 62 yo after horse accident    reports that he quit smoking about 4 years ago. He has never used smokeless tobacco. He reports that he drinks about 6.0 standard drinks of alcohol per week. He reports that he has current or past drug history. Drug: Marijuana. family history includes Alcohol abuse in his other; Cancer in his other and other; Diabetes in his other and other; Heart disease in his other; Hyperlipidemia in his other; Hypertension in his other; Stroke in  his other and other. Allergies  Allergen Reactions  . Isovue [Iopamidol] Hives and Itching  . Sulfa Antibiotics Nausea And Vomiting   Current Outpatient Medications on File Prior to Visit  Medication Sig Dispense Refill  . acetaminophen (TYLENOL) 325 MG tablet Take 2 tablets (650 mg total) by mouth every 6 (six) hours as needed. Do not take more than 4000mg  of tylenol per day 30 tablet 0  . aspirin 81 MG EC tablet Take 1 tablet (81 mg total) by mouth daily. Swallow whole. 30 tablet 12  . emtricitabine-tenofovir (TRUVADA) 200-300 MG tablet Patient on [id number] study. This may be placebo. DO NOT FILL. This is a study provided medication. (Patient taking differently: Take 1 tablet by mouth daily. ) 30 tablet 11  . omeprazole (PRILOSEC) 20 MG capsule Take 1 capsule (20 mg total) by mouth daily as needed (for heartburn). 30 capsule 4   No current facility-administered medications on file prior to visit.    Review of Systems Constitutional: Negative for other unusual diaphoresis, sweats, appetite or weight changes HENT: Negative for other worsening hearing loss, ear pain, facial swelling, mouth sores or neck stiffness.   Eyes: Negative for other worsening pain, redness or other visual disturbance.  Respiratory: Negative for other stridor or swelling Cardiovascular: Negative for other palpitations or other chest pain  Gastrointestinal: Negative for worsening diarrhea or loose stools, blood in stool, distention or other pain Genitourinary: Negative for hematuria, flank pain or other change in urine volume.  Musculoskeletal: Negative for myalgias or other joint swelling.  Skin: Negative for other color change, or other wound or worsening drainage.  Neurological: Negative for other syncope or numbness. Hematological: Negative for other adenopathy or swelling Psychiatric/Behavioral: Negative for hallucinations, other worsening agitation, SI, self-injury, or new decreased concentration All other system  neg per pt    Objective:   Physical Exam BP 132/84   Pulse 69   Temp 98 F (36.7 C) (Oral)   Ht 5\' 9"  (1.753 m)   Wt 166 lb (75.3 kg)   SpO2 98%   BMI 24.51 kg/m  VS noted,  Constitutional: Pt is oriented to person, place, and time. Appears well-developed and well-nourished, in no significant distress and comfortable Head: Normocephalic and atraumatic  Eyes: Conjunctivae and EOM are normal. Pupils are equal, round, and reactive to light Right Ear: External ear normal without discharge Left Ear: External ear normal without discharge Nose: Nose without discharge or deformity Mouth/Throat: Oropharynx is without other ulcerations and moist  Neck: Normal range of motion. Neck supple. No JVD present. No tracheal deviation present or significant neck LA or mass Cardiovascular: Normal rate, regular rhythm, normal heart sounds and intact distal pulses.   Pulmonary/Chest: WOB normal and breath sounds without rales or wheezing  Abdominal: Soft. Bowel sounds are normal. NT. No HSM  Musculoskeletal: Normal range of motion. Exhibits no edema Lymphadenopathy: Has no other cervical adenopathy.  Neurological: Pt is alert and oriented to person, place, and time. Pt has normal reflexes. No cranial nerve deficit. Motor grossly intact, Gait intact Skin: Skin is warm and dry. No rash noted or new ulcerations Psychiatric:  Has normal mood and affect. Behavior is normal without agitation No other exam findings Lab Results  Component Value Date   WBC 4.4 02/18/2018   HGB 13.4 02/18/2018   HCT 39.7 02/18/2018   PLT 215 02/18/2018   GLUCOSE 110 (H) 02/18/2018   CHOL 135 12/16/2017   TRIG 84 12/16/2017   HDL 51 12/16/2017   LDLDIRECT 89.0 12/30/2015   LDLCALC 68 12/16/2017   ALT 9 02/18/2018   AST 18 02/18/2018   NA 136 02/18/2018   K 4.2 02/18/2018   CL 101 02/18/2018   CREATININE 1.15 02/18/2018   BUN 18 02/18/2018   CO2 28 02/18/2018   TSH 1.06 12/14/2016   PSA 0.57 12/14/2016         Assessment & Plan:

## 2018-04-02 NOTE — Patient Instructions (Addendum)

## 2018-04-02 NOTE — Assessment & Plan Note (Signed)
Ok for celexa restart

## 2018-04-03 ENCOUNTER — Encounter (INDEPENDENT_AMBULATORY_CARE_PROVIDER_SITE_OTHER): Payer: Self-pay | Admitting: *Deleted

## 2018-04-03 VITALS — BP 159/85 | HR 69 | Temp 97.6°F | Wt 164.8 lb

## 2018-04-03 DIAGNOSIS — Z006 Encounter for examination for normal comparison and control in clinical research program: Secondary | ICD-10-CM

## 2018-04-03 MED ORDER — STUDY - HPTN 083 - EMTRICITABINE/TENOFOVIR DISOPROXIL FUMARATE 200-300MG (TRUVADA) OR PLACEBO TABLET (PI-VAN DAM): 1.0000 | ORAL_TABLET | Freq: Every day | ORAL | Status: DC

## 2018-04-03 MED ORDER — STUDY - HPTN 083 - CABOTEGRAVIR 600MG/3ML INJECTION OR PLACEBO (PI-VAN DAM): 600.0000 mg | INJECTION | INTRAMUSCULAR | 0 refills | Status: DC

## 2018-04-03 NOTE — Research (Signed)
Mason Morales is here for his week 73 HPTN 083 visit. Continues to have intermittent abdominal discomfort along with occasional epigastric burning and nausea. States that he was seen by Dr. Dulce Sellarutlaw and had an upper endoscopy with biopsies on 11/23, which was "negative". States that he was started on omeprazole, but feels it has not made any improvement in his symptoms. He has been taking this in the evenings since he take his oral study medication in the mornings. Encouraged him to try and keep a food diary to see if this would help determine if certain foods contributed to his symptoms. Denies any adherence issues with his oral study medication. Rapid HIV non-reactive. Cabotegravir/placebo injection given (R) glute without problem. He will return in 2 weeks for his next study visit.

## 2018-04-04 LAB — CBC WITH DIFFERENTIAL/PLATELET
Basophils Absolute: 22 cells/uL (ref 0–200)
Basophils Relative: 0.5 %
Eosinophils Absolute: 9 cells/uL — ABNORMAL LOW (ref 15–500)
Eosinophils Relative: 0.2 %
HCT: 44.3 % (ref 38.5–50.0)
Hemoglobin: 15.3 g/dL (ref 13.2–17.1)
LYMPHS ABS: 1802 {cells}/uL (ref 850–3900)
MCH: 30.5 pg (ref 27.0–33.0)
MCHC: 34.5 g/dL (ref 32.0–36.0)
MCV: 88.2 fL (ref 80.0–100.0)
MPV: 10 fL (ref 7.5–12.5)
Monocytes Relative: 6 %
Neutro Abs: 2210 cells/uL (ref 1500–7800)
Neutrophils Relative %: 51.4 %
Platelets: 229 10*3/uL (ref 140–400)
RBC: 5.02 10*6/uL (ref 4.20–5.80)
RDW: 12.8 % (ref 11.0–15.0)
Total Lymphocyte: 41.9 %
WBC mixed population: 258 cells/uL (ref 200–950)
WBC: 4.3 10*3/uL (ref 3.8–10.8)

## 2018-04-04 LAB — COMPLETE METABOLIC PANEL WITH GFR
AG Ratio: 1.5 (calc) (ref 1.0–2.5)
ALKALINE PHOSPHATASE (APISO): 88 U/L (ref 40–115)
ALT: 8 U/L — ABNORMAL LOW (ref 9–46)
AST: 16 U/L (ref 10–35)
Albumin: 4.7 g/dL (ref 3.6–5.1)
BUN: 20 mg/dL (ref 7–25)
CO2: 28 mmol/L (ref 20–32)
CREATININE: 1.12 mg/dL (ref 0.70–1.25)
Calcium: 9.8 mg/dL (ref 8.6–10.3)
Chloride: 104 mmol/L (ref 98–110)
GFR, EST NON AFRICAN AMERICAN: 71 mL/min/{1.73_m2} (ref >=60)
GFR, Est African American: 82 mL/min/{1.73_m2} (ref >=60)
Globulin: 3.2 g/dL (calc) (ref 1.9–3.7)
Glucose, Bld: 88 mg/dL (ref 65–99)
Potassium: 4 mmol/L (ref 3.5–5.3)
Sodium: 141 mmol/L (ref 135–146)
Total Bilirubin: 0.6 mg/dL (ref 0.2–1.2)
Total Protein: 7.9 g/dL (ref 6.1–8.1)

## 2018-04-04 LAB — LIPASE: Lipase: 34 U/L (ref 7–60)

## 2018-04-04 LAB — CK: CK TOTAL: 98 U/L (ref 44–196)

## 2018-04-04 LAB — PHOSPHORUS: PHOSPHORUS: 2.7 mg/dL (ref 2.5–4.5)

## 2018-04-04 LAB — HIV ANTIBODY (ROUTINE TESTING W REFLEX): HIV 1&2 Ab, 4th Generation: NONREACTIVE

## 2018-04-04 LAB — AMYLASE: Amylase: 54 U/L (ref 21–101)

## 2018-04-14 ENCOUNTER — Other Ambulatory Visit: Payer: Self-pay | Admitting: Physician Assistant

## 2018-04-14 DIAGNOSIS — R110 Nausea: Secondary | ICD-10-CM

## 2018-04-14 DIAGNOSIS — R1013 Epigastric pain: Secondary | ICD-10-CM

## 2018-04-18 ENCOUNTER — Encounter (INDEPENDENT_AMBULATORY_CARE_PROVIDER_SITE_OTHER): Payer: BLUE CROSS/BLUE SHIELD

## 2018-04-18 DIAGNOSIS — Z006 Encounter for examination for normal comparison and control in clinical research program: Secondary | ICD-10-CM

## 2018-04-18 NOTE — Research (Signed)
Patient here for [id number] study visit for follow up. He has had no new medical concerns. He feels that he has been doing well with taking his medications daily. Today's rapid HIV non-reactive. Participant scheduled for next study visit on 05/26/18.

## 2018-04-19 LAB — CBC WITH DIFFERENTIAL/PLATELET
Absolute Monocytes: 200 {cells}/uL (ref 200–950)
Basophils Absolute: 20 {cells}/uL (ref 0–200)
Basophils Relative: 0.5 %
Eosinophils Absolute: 20 {cells}/uL (ref 15–500)
Eosinophils Relative: 0.5 %
HCT: 43.6 % (ref 38.5–50.0)
Hemoglobin: 14.5 g/dL (ref 13.2–17.1)
Lymphs Abs: 1872 {cells}/uL (ref 850–3900)
MCH: 29.9 pg (ref 27.0–33.0)
MCHC: 33.3 g/dL (ref 32.0–36.0)
MCV: 89.9 fL (ref 80.0–100.0)
MPV: 9.8 fL (ref 7.5–12.5)
Monocytes Relative: 5 %
Neutro Abs: 1888 {cells}/uL (ref 1500–7800)
Neutrophils Relative %: 47.2 %
Platelets: 209 Thousand/uL (ref 140–400)
RBC: 4.85 Million/uL (ref 4.20–5.80)
RDW: 12.6 % (ref 11.0–15.0)
Total Lymphocyte: 46.8 %
WBC: 4 Thousand/uL (ref 3.8–10.8)

## 2018-04-19 LAB — COMPREHENSIVE METABOLIC PANEL WITH GFR
AG Ratio: 1.6 (calc) (ref 1.0–2.5)
ALT: 8 U/L — ABNORMAL LOW (ref 9–46)
AST: 17 U/L (ref 10–35)
Albumin: 4.7 g/dL (ref 3.6–5.1)
Alkaline phosphatase (APISO): 82 U/L (ref 40–115)
BUN: 12 mg/dL (ref 7–25)
CO2: 28 mmol/L (ref 20–32)
Calcium: 9.4 mg/dL (ref 8.6–10.3)
Chloride: 103 mmol/L (ref 98–110)
Creat: 1.11 mg/dL (ref 0.70–1.25)
Globulin: 2.9 g/dL (ref 1.9–3.7)
Glucose, Bld: 95 mg/dL (ref 65–99)
Potassium: 3.9 mmol/L (ref 3.5–5.3)
Sodium: 140 mmol/L (ref 135–146)
Total Bilirubin: 0.4 mg/dL (ref 0.2–1.2)
Total Protein: 7.6 g/dL (ref 6.1–8.1)

## 2018-04-19 LAB — LIPASE: Lipase: 33 U/L (ref 7–60)

## 2018-04-19 LAB — CK: Total CK: 109 U/L (ref 44–196)

## 2018-04-19 LAB — HIV ANTIBODY (ROUTINE TESTING W REFLEX): HIV 1&2 Ab, 4th Generation: NONREACTIVE

## 2018-04-19 LAB — PHOSPHORUS: Phosphorus: 2.9 mg/dL (ref 2.5–4.5)

## 2018-04-19 LAB — AMYLASE: AMYLASE: 64 U/L (ref 21–101)

## 2018-04-21 ENCOUNTER — Ambulatory Visit
Admission: RE | Admit: 2018-04-21 | Discharge: 2018-04-21 | Disposition: A | Discharge status: Home / Self Care | Payer: BLUE CROSS/BLUE SHIELD | Source: Ambulatory Visit | Attending: Physician Assistant | Admitting: Physician Assistant

## 2018-04-21 DIAGNOSIS — R1013 Epigastric pain: Secondary | ICD-10-CM

## 2018-04-21 DIAGNOSIS — R110 Nausea: Secondary | ICD-10-CM

## 2018-05-26 ENCOUNTER — Encounter (INDEPENDENT_AMBULATORY_CARE_PROVIDER_SITE_OTHER): Payer: Self-pay | Admitting: *Deleted

## 2018-05-26 VITALS — BP 161/84 | HR 68 | Temp 97.6°F | Wt 168.2 lb

## 2018-05-26 DIAGNOSIS — Z006 Encounter for examination for normal comparison and control in clinical research program: Secondary | ICD-10-CM

## 2018-05-26 NOTE — Research (Signed)
Mason Morales is here for his week 81 HPTN 083 visit. He says that he was recently diagnosed with IBS-C. States that his abdominal pain has resolved since starting on linzess. No new complaint or concerns. Adherence at 90% with his oral study medication. Rapid HIV non-reactive. Cabotegravir/placebo injection given (R) glute without problem. He will return in 2 weeks for his next study visit.

## 2018-05-27 LAB — CBC WITH DIFFERENTIAL/PLATELET
Absolute Monocytes: 270 cells/uL (ref 200–950)
Basophils Absolute: 19 cells/uL (ref 0–200)
Basophils Relative: 0.5 %
Eosinophils Absolute: 19 cells/uL (ref 15–500)
Eosinophils Relative: 0.5 %
HCT: 43.1 % (ref 38.5–50.0)
Hemoglobin: 14.6 g/dL (ref 13.2–17.1)
LYMPHS ABS: 1898 {cells}/uL (ref 850–3900)
MCH: 30.4 pg (ref 27.0–33.0)
MCHC: 33.9 g/dL (ref 32.0–36.0)
MCV: 89.6 fL (ref 80.0–100.0)
MPV: 9.8 fL (ref 7.5–12.5)
Monocytes Relative: 7.3 %
Neutro Abs: 1495 cells/uL — ABNORMAL LOW (ref 1500–7800)
Neutrophils Relative %: 40.4 %
PLATELETS: 201 10*3/uL (ref 140–400)
RBC: 4.81 10*6/uL (ref 4.20–5.80)
RDW: 12.8 % (ref 11.0–15.0)
Total Lymphocyte: 51.3 %
WBC: 3.7 10*3/uL — ABNORMAL LOW (ref 3.8–10.8)

## 2018-05-27 LAB — COMPREHENSIVE METABOLIC PANEL
AG Ratio: 1.7 (calc) (ref 1.0–2.5)
ALBUMIN MSPROF: 4.4 g/dL (ref 3.6–5.1)
ALT: 7 U/L — ABNORMAL LOW (ref 9–46)
AST: 14 U/L (ref 10–35)
Alkaline phosphatase (APISO): 73 U/L (ref 35–144)
BUN: 14 mg/dL (ref 7–25)
CO2: 28 mmol/L (ref 20–32)
Calcium: 9.1 mg/dL (ref 8.6–10.3)
Chloride: 103 mmol/L (ref 98–110)
Creat: 1.05 mg/dL (ref 0.70–1.25)
Globulin: 2.6 g/dL (calc) (ref 1.9–3.7)
Glucose, Bld: 95 mg/dL (ref 65–99)
Potassium: 3.7 mmol/L (ref 3.5–5.3)
Sodium: 140 mmol/L (ref 135–146)
Total Bilirubin: 0.4 mg/dL (ref 0.2–1.2)
Total Protein: 7 g/dL (ref 6.1–8.1)

## 2018-05-27 LAB — CK: CK TOTAL: 98 U/L (ref 44–196)

## 2018-05-27 LAB — C. TRACHOMATIS/N. GONORRHOEAE RNA
C. trachomatis RNA, TMA: NOT DETECTED
N. gonorrhoeae RNA, TMA: NOT DETECTED

## 2018-05-27 LAB — RPR TITER: RPR Titer: 1:2 {titer} — ABNORMAL HIGH

## 2018-05-27 LAB — FLUORESCENT TREPONEMAL AB(FTA)-IGG-BLD: Fluorescent Treponemal ABS: REACTIVE — AB

## 2018-05-27 LAB — AMYLASE: Amylase: 64 U/L (ref 21–101)

## 2018-05-27 LAB — RPR: RPR: REACTIVE — AB

## 2018-05-27 LAB — HIV ANTIBODY (ROUTINE TESTING W REFLEX): HIV: NONREACTIVE

## 2018-05-27 LAB — PHOSPHORUS: Phosphorus: 2.8 mg/dL (ref 2.5–4.5)

## 2018-05-27 LAB — LIPASE: Lipase: 37 U/L (ref 7–60)

## 2018-05-29 LAB — CT/NG RNA, TMA RECTAL
Chlamydia Trachomatis RNA: NOT DETECTED
Neisseria Gonorrhoeae RNA: NOT DETECTED

## 2018-06-10 ENCOUNTER — Encounter (INDEPENDENT_AMBULATORY_CARE_PROVIDER_SITE_OTHER): Payer: Self-pay | Admitting: *Deleted

## 2018-06-10 VITALS — BP 142/79 | HR 64 | Temp 98.0°F | Wt 165.0 lb

## 2018-06-10 DIAGNOSIS — Z006 Encounter for examination for normal comparison and control in clinical research program: Secondary | ICD-10-CM

## 2018-06-10 NOTE — Research (Signed)
Mason Morales is here for his week 83 HPTN083 visit. Denied any site reaction after his last injection. No new complaints or medications. Verbalized excellent adherence with his oral study medication. Rapid HIV non-reactive. He will return in March for his next study visit.

## 2018-06-11 LAB — CBC WITH DIFFERENTIAL/PLATELET
Absolute Monocytes: 218 cells/uL (ref 200–950)
BASOS PCT: 0.5 %
Basophils Absolute: 20 cells/uL (ref 0–200)
Eosinophils Absolute: 31 cells/uL (ref 15–500)
Eosinophils Relative: 0.8 %
HCT: 41.6 % (ref 38.5–50.0)
Hemoglobin: 13.8 g/dL (ref 13.2–17.1)
Lymphs Abs: 1970 cells/uL (ref 850–3900)
MCH: 29.6 pg (ref 27.0–33.0)
MCHC: 33.2 g/dL (ref 32.0–36.0)
MCV: 89.1 fL (ref 80.0–100.0)
MPV: 10.4 fL (ref 7.5–12.5)
Monocytes Relative: 5.6 %
Neutro Abs: 1661 cells/uL (ref 1500–7800)
Neutrophils Relative %: 42.6 %
PLATELETS: 202 10*3/uL (ref 140–400)
RBC: 4.67 10*6/uL (ref 4.20–5.80)
RDW: 12.9 % (ref 11.0–15.0)
Total Lymphocyte: 50.5 %
WBC: 3.9 10*3/uL (ref 3.8–10.8)

## 2018-06-11 LAB — COMPREHENSIVE METABOLIC PANEL
AG Ratio: 1.7 (calc) (ref 1.0–2.5)
ALT: 8 U/L — ABNORMAL LOW (ref 9–46)
AST: 16 U/L (ref 10–35)
Albumin: 4.3 g/dL (ref 3.6–5.1)
Alkaline phosphatase (APISO): 74 U/L (ref 35–144)
BUN: 15 mg/dL (ref 7–25)
CALCIUM: 9.1 mg/dL (ref 8.6–10.3)
CO2: 25 mmol/L (ref 20–32)
Chloride: 104 mmol/L (ref 98–110)
Creat: 0.88 mg/dL (ref 0.70–1.25)
Globulin: 2.6 g/dL (calc) (ref 1.9–3.7)
Glucose, Bld: 91 mg/dL (ref 65–99)
Potassium: 3.6 mmol/L (ref 3.5–5.3)
Sodium: 138 mmol/L (ref 135–146)
Total Bilirubin: 0.4 mg/dL (ref 0.2–1.2)
Total Protein: 6.9 g/dL (ref 6.1–8.1)

## 2018-06-11 LAB — AMYLASE: Amylase: 60 U/L (ref 21–101)

## 2018-06-11 LAB — LIPASE: Lipase: 14 U/L (ref 7–60)

## 2018-06-11 LAB — PHOSPHORUS: Phosphorus: 2.6 mg/dL (ref 2.5–4.5)

## 2018-06-11 LAB — CK: CK TOTAL: 113 U/L (ref 44–196)

## 2018-06-11 LAB — HIV ANTIBODY (ROUTINE TESTING W REFLEX): HIV 1&2 Ab, 4th Generation: NONREACTIVE

## 2018-07-21 ENCOUNTER — Encounter (INDEPENDENT_AMBULATORY_CARE_PROVIDER_SITE_OTHER): Payer: Self-pay | Admitting: *Deleted

## 2018-07-21 ENCOUNTER — Other Ambulatory Visit: Payer: Self-pay

## 2018-07-21 VITALS — BP 171/87 | HR 59 | Temp 97.6°F | Wt 155.5 lb

## 2018-07-21 DIAGNOSIS — Z006 Encounter for examination for normal comparison and control in clinical research program: Secondary | ICD-10-CM

## 2018-07-21 NOTE — Research (Signed)
Mason Morales is here for his week 89 HPTN 083 visit. BP elevated this morning but states that he has not taken his BP medication the past 2 days. Denied any dizziness, headache or visual changes. Plans to take his medication as soon as he gets home this morning. He says he has noted some weight loss over the past few weeks. He has lost about 14lbs since his last visit in February. States that his appetite has decreased some otherwise no complaints. We discussed eating small frequent meals and trying carnation instant breakfast drink with meals/snack. He did not return his oral study product for pill count but states that he has had good adherence. Rapid HIV non-reactive. Cabotegravir/placebo injection given (R) glute without problem. He is scheduled to return on 08/01/18 for his next study visit.

## 2018-07-22 LAB — CBC WITH DIFFERENTIAL/PLATELET
Absolute Monocytes: 198 cells/uL — ABNORMAL LOW (ref 200–950)
Basophils Absolute: 30 cells/uL (ref 0–200)
Basophils Relative: 0.8 %
Eosinophils Absolute: 19 cells/uL (ref 15–500)
Eosinophils Relative: 0.5 %
HCT: 43.8 % (ref 38.5–50.0)
Hemoglobin: 14.7 g/dL (ref 13.2–17.1)
Lymphs Abs: 2295 cells/uL (ref 850–3900)
MCH: 29.8 pg (ref 27.0–33.0)
MCHC: 33.6 g/dL (ref 32.0–36.0)
MCV: 88.7 fL (ref 80.0–100.0)
MPV: 10 fL (ref 7.5–12.5)
Monocytes Relative: 5.2 %
Neutro Abs: 1258 cells/uL — ABNORMAL LOW (ref 1500–7800)
Neutrophils Relative %: 33.1 %
Platelets: 205 10*3/uL (ref 140–400)
RBC: 4.94 10*6/uL (ref 4.20–5.80)
RDW: 12.9 % (ref 11.0–15.0)
Total Lymphocyte: 60.4 %
WBC: 3.8 10*3/uL (ref 3.8–10.8)

## 2018-07-22 LAB — COMPREHENSIVE METABOLIC PANEL
AG Ratio: 1.7 (calc) (ref 1.0–2.5)
ALKALINE PHOSPHATASE (APISO): 64 U/L (ref 35–144)
ALT: 10 U/L (ref 9–46)
AST: 15 U/L (ref 10–35)
Albumin: 4.4 g/dL (ref 3.6–5.1)
BUN: 17 mg/dL (ref 7–25)
CO2: 28 mmol/L (ref 20–32)
CREATININE: 1.04 mg/dL (ref 0.70–1.25)
Calcium: 9.1 mg/dL (ref 8.6–10.3)
Chloride: 104 mmol/L (ref 98–110)
Globulin: 2.6 g/dL (calc) (ref 1.9–3.7)
Glucose, Bld: 85 mg/dL (ref 65–99)
Potassium: 4 mmol/L (ref 3.5–5.3)
Sodium: 141 mmol/L (ref 135–146)
Total Bilirubin: 0.5 mg/dL (ref 0.2–1.2)
Total Protein: 7 g/dL (ref 6.1–8.1)

## 2018-07-22 LAB — PHOSPHORUS: Phosphorus: 2.5 mg/dL (ref 2.5–4.5)

## 2018-07-22 LAB — CK: Total CK: 126 U/L (ref 44–196)

## 2018-07-22 LAB — HIV ANTIBODY (ROUTINE TESTING W REFLEX): HIV 1&2 Ab, 4th Generation: NONREACTIVE

## 2018-07-22 LAB — LIPASE: Lipase: 39 U/L (ref 7–60)

## 2018-07-22 LAB — AMYLASE: Amylase: 59 U/L (ref 21–101)

## 2018-07-28 ENCOUNTER — Encounter: Payer: BLUE CROSS/BLUE SHIELD | Admitting: *Deleted

## 2018-07-31 ENCOUNTER — Other Ambulatory Visit: Payer: Self-pay

## 2018-07-31 ENCOUNTER — Encounter (INDEPENDENT_AMBULATORY_CARE_PROVIDER_SITE_OTHER): Payer: Self-pay

## 2018-07-31 VITALS — BP 170/83 | HR 61 | Temp 98.4°F | Wt 160.5 lb

## 2018-07-31 DIAGNOSIS — Z006 Encounter for examination for normal comparison and control in clinical research program: Secondary | ICD-10-CM

## 2018-07-31 NOTE — Research (Signed)
Participant here for study [id number]. No new complaints or concerns voiced. He has regained some weight. His blood pressure is still elevated. He states that he has not taken his medication yet today, that he usually takes it mid-day. 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 He often goes without sufficient sleep and takes care of his father's needs before his own. He was encouraged to take some time to de-stress. He states he is looking into hiring some help as well as using some pre-prepared meals. He states he thinks he has done well with taking his study medication daily with the exception of missing 2-3 days. He is scheduled for a return visit on 09/11/18.

## 2018-08-01 LAB — CBC WITH DIFFERENTIAL/PLATELET
Absolute Monocytes: 262 cells/uL (ref 200–950)
Basophils Absolute: 18 cells/uL (ref 0–200)
Basophils Relative: 0.4 %
Eosinophils Absolute: 60 cells/uL (ref 15–500)
Eosinophils Relative: 1.3 %
HCT: 43.7 % (ref 38.5–50.0)
Hemoglobin: 14.6 g/dL (ref 13.2–17.1)
Lymphs Abs: 2571 cells/uL (ref 850–3900)
MCH: 30.1 pg (ref 27.0–33.0)
MCHC: 33.4 g/dL (ref 32.0–36.0)
MCV: 90.1 fL (ref 80.0–100.0)
MPV: 10.1 fL (ref 7.5–12.5)
Monocytes Relative: 5.7 %
Neutro Abs: 1688 cells/uL (ref 1500–7800)
Neutrophils Relative %: 36.7 %
Platelets: 205 10*3/uL (ref 140–400)
RBC: 4.85 10*6/uL (ref 4.20–5.80)
RDW: 12.8 % (ref 11.0–15.0)
Total Lymphocyte: 55.9 %
WBC: 4.6 10*3/uL (ref 3.8–10.8)

## 2018-08-01 LAB — COMPREHENSIVE METABOLIC PANEL
AG Ratio: 1.6 (calc) (ref 1.0–2.5)
ALT: 9 U/L (ref 9–46)
AST: 13 U/L (ref 10–35)
Albumin: 4.1 g/dL (ref 3.6–5.1)
Alkaline phosphatase (APISO): 69 U/L (ref 35–144)
BUN: 15 mg/dL (ref 7–25)
CO2: 29 mmol/L (ref 20–32)
Calcium: 9.3 mg/dL (ref 8.6–10.3)
Chloride: 104 mmol/L (ref 98–110)
Creat: 1.22 mg/dL (ref 0.70–1.25)
Globulin: 2.5 g/dL (calc) (ref 1.9–3.7)
Glucose, Bld: 84 mg/dL (ref 65–99)
Potassium: 4.3 mmol/L (ref 3.5–5.3)
Sodium: 140 mmol/L (ref 135–146)
Total Bilirubin: 0.3 mg/dL (ref 0.2–1.2)
Total Protein: 6.6 g/dL (ref 6.1–8.1)

## 2018-08-01 LAB — LIPASE: Lipase: 38 U/L (ref 7–60)

## 2018-08-01 LAB — CK: Total CK: 94 U/L (ref 44–196)

## 2018-08-01 LAB — HIV ANTIBODY (ROUTINE TESTING W REFLEX): HIV 1&2 Ab, 4th Generation: NONREACTIVE

## 2018-08-01 LAB — PHOSPHORUS: Phosphorus: 2.8 mg/dL (ref 2.5–4.5)

## 2018-08-01 LAB — AMYLASE: Amylase: 61 U/L (ref 21–101)

## 2018-09-11 ENCOUNTER — Encounter: Payer: BLUE CROSS/BLUE SHIELD | Admitting: *Deleted

## 2018-09-18 ENCOUNTER — Other Ambulatory Visit: Payer: Self-pay

## 2018-09-18 ENCOUNTER — Encounter (INDEPENDENT_AMBULATORY_CARE_PROVIDER_SITE_OTHER): Payer: Self-pay | Admitting: *Deleted

## 2018-09-18 VITALS — BP 161/79 | HR 66 | Temp 97.7°F | Wt 153.5 lb

## 2018-09-18 DIAGNOSIS — Z006 Encounter for examination for normal comparison and control in clinical research program: Secondary | ICD-10-CM

## 2018-09-18 NOTE — Research (Signed)
Mason Morales is here for his week 97 HPTN 083 visit. No new complaints or medications. BP elevated this morning. States he took his BP medication this morning but has not been taking it consistently. Reinforced importance of taking his medication daily as directed. He has lost 7lbs since his last visit. States that he has been working a lot recently and has not really had an appetite. States that the carnation instant breakfast drinks helped before and plans to restart them today. He says that his siblings have started to assist with the care of his dad which has been a tremendous help and relief. He did not return his oral study product. Feels that he has good adherence but may have missed 1-2 doses. Rapid HIV non-reactive. Cabotegravir/placebo injection given (R) glute without problem. He will return in 2 weeks for his next study visit.

## 2018-09-19 LAB — COMPREHENSIVE METABOLIC PANEL
AG Ratio: 1.3 (calc) (ref 1.0–2.5)
ALT: 10 U/L (ref 9–46)
AST: 20 U/L (ref 10–35)
Albumin: 4.3 g/dL (ref 3.6–5.1)
Alkaline phosphatase (APISO): 74 U/L (ref 35–144)
BUN/Creatinine Ratio: 12 (calc) (ref 6–22)
BUN: 15 mg/dL (ref 7–25)
CO2: 22 mmol/L (ref 20–32)
Calcium: 9.5 mg/dL (ref 8.6–10.3)
Chloride: 101 mmol/L (ref 98–110)
Creat: 1.27 mg/dL — ABNORMAL HIGH (ref 0.70–1.25)
Globulin: 3.2 g/dL (calc) (ref 1.9–3.7)
Glucose, Bld: 103 mg/dL — ABNORMAL HIGH (ref 65–99)
Potassium: 3.8 mmol/L (ref 3.5–5.3)
Sodium: 137 mmol/L (ref 135–146)
Total Bilirubin: 0.4 mg/dL (ref 0.2–1.2)
Total Protein: 7.5 g/dL (ref 6.1–8.1)

## 2018-09-19 LAB — HIV ANTIBODY (ROUTINE TESTING W REFLEX): HIV 1&2 Ab, 4th Generation: NONREACTIVE

## 2018-09-19 LAB — CBC WITH DIFFERENTIAL/PLATELET
Absolute Monocytes: 213 cells/uL (ref 200–950)
Basophils Absolute: 21 cells/uL (ref 0–200)
Basophils Relative: 0.5 %
Eosinophils Absolute: 8 cells/uL — ABNORMAL LOW (ref 15–500)
Eosinophils Relative: 0.2 %
HCT: 45.3 % (ref 38.5–50.0)
Hemoglobin: 15.3 g/dL (ref 13.2–17.1)
Lymphs Abs: 1833 cells/uL (ref 850–3900)
MCH: 30.1 pg (ref 27.0–33.0)
MCHC: 33.8 g/dL (ref 32.0–36.0)
MCV: 89 fL (ref 80.0–100.0)
MPV: 9.8 fL (ref 7.5–12.5)
Monocytes Relative: 5.2 %
Neutro Abs: 2025 cells/uL (ref 1500–7800)
Neutrophils Relative %: 49.4 %
Platelets: 215 10*3/uL (ref 140–400)
RBC: 5.09 10*6/uL (ref 4.20–5.80)
RDW: 13 % (ref 11.0–15.0)
Total Lymphocyte: 44.7 %
WBC: 4.1 10*3/uL (ref 3.8–10.8)

## 2018-09-19 LAB — PHOSPHORUS: Phosphorus: 2.8 mg/dL (ref 2.5–4.5)

## 2018-09-19 LAB — CK: Total CK: 97 U/L (ref 44–196)

## 2018-09-19 LAB — LIPASE: Lipase: 33 U/L (ref 7–60)

## 2018-09-19 LAB — AMYLASE: Amylase: 58 U/L (ref 21–101)

## 2018-10-02 ENCOUNTER — Other Ambulatory Visit: Payer: Self-pay

## 2018-10-02 ENCOUNTER — Other Ambulatory Visit: Payer: BC Managed Care – PPO

## 2018-10-02 ENCOUNTER — Encounter (INDEPENDENT_AMBULATORY_CARE_PROVIDER_SITE_OTHER): Payer: Self-pay | Admitting: *Deleted

## 2018-10-02 VITALS — BP 131/86 | HR 76 | Temp 98.0°F | Wt 154.5 lb

## 2018-10-02 DIAGNOSIS — Z006 Encounter for examination for normal comparison and control in clinical research program: Secondary | ICD-10-CM

## 2018-10-02 NOTE — Research (Signed)
Mason Morales is here for his week [place] visit. Denied any problems after his last injection. He says that he is having milky white penile discharge which started about 5 days ago. Verbalized concern of possible STI exposure after having a condom "break" during sex. Obtained urine and rectal GC/CT at today's visit. No other complaints or concerns. BP better today. States that he has been adherent with his BP medication as well as his oral study medication. He will return in July for his study visit.

## 2018-10-03 LAB — CBC WITH DIFFERENTIAL/PLATELET
Absolute Monocytes: 281 cells/uL (ref 200–950)
Basophils Absolute: 28 cells/uL (ref 0–200)
Basophils Relative: 0.5 %
Eosinophils Absolute: 50 cells/uL (ref 15–500)
Eosinophils Relative: 0.9 %
HCT: 44.3 % (ref 38.5–50.0)
Hemoglobin: 14.9 g/dL (ref 13.2–17.1)
Lymphs Abs: 2855 cells/uL (ref 850–3900)
MCH: 30.2 pg (ref 27.0–33.0)
MCHC: 33.6 g/dL (ref 32.0–36.0)
MCV: 89.9 fL (ref 80.0–100.0)
MPV: 10 fL (ref 7.5–12.5)
Monocytes Relative: 5.1 %
Neutro Abs: 2288 cells/uL (ref 1500–7800)
Neutrophils Relative %: 41.6 %
Platelets: 220 10*3/uL (ref 140–400)
RBC: 4.93 10*6/uL (ref 4.20–5.80)
RDW: 12.8 % (ref 11.0–15.0)
Total Lymphocyte: 51.9 %
WBC: 5.5 10*3/uL (ref 3.8–10.8)

## 2018-10-03 LAB — COMPREHENSIVE METABOLIC PANEL
AG Ratio: 1.6 (calc) (ref 1.0–2.5)
ALT: 10 U/L (ref 9–46)
AST: 17 U/L (ref 10–35)
Albumin: 4.4 g/dL (ref 3.6–5.1)
Alkaline phosphatase (APISO): 72 U/L (ref 35–144)
BUN: 20 mg/dL (ref 7–25)
CO2: 26 mmol/L (ref 20–32)
Calcium: 9.5 mg/dL (ref 8.6–10.3)
Chloride: 104 mmol/L (ref 98–110)
Creat: 1.2 mg/dL (ref 0.70–1.25)
Globulin: 2.7 g/dL (calc) (ref 1.9–3.7)
Glucose, Bld: 101 mg/dL — ABNORMAL HIGH (ref 65–99)
Potassium: 4.4 mmol/L (ref 3.5–5.3)
Sodium: 138 mmol/L (ref 135–146)
Total Bilirubin: 0.4 mg/dL (ref 0.2–1.2)
Total Protein: 7.1 g/dL (ref 6.1–8.1)

## 2018-10-03 LAB — LIPASE: Lipase: 43 U/L (ref 7–60)

## 2018-10-03 LAB — C. TRACHOMATIS/N. GONORRHOEAE RNA
C. trachomatis RNA, TMA: NOT DETECTED
N. gonorrhoeae RNA, TMA: NOT DETECTED

## 2018-10-03 LAB — PHOSPHORUS: Phosphorus: 2.8 mg/dL (ref 2.5–4.5)

## 2018-10-03 LAB — CK: Total CK: 110 U/L (ref 44–196)

## 2018-10-03 LAB — HIV ANTIBODY (ROUTINE TESTING W REFLEX): HIV 1&2 Ab, 4th Generation: NONREACTIVE

## 2018-10-03 LAB — AMYLASE: Amylase: 80 U/L (ref 21–101)

## 2018-10-05 LAB — CT/NG RNA, TMA RECTAL
Chlamydia Trachomatis RNA: NOT DETECTED
Neisseria Gonorrhoeae RNA: NOT DETECTED

## 2018-10-31 ENCOUNTER — Encounter: Payer: Self-pay | Admitting: Internal Medicine

## 2018-11-06 ENCOUNTER — Encounter: Payer: Self-pay | Admitting: Internal Medicine

## 2018-11-13 ENCOUNTER — Encounter (INDEPENDENT_AMBULATORY_CARE_PROVIDER_SITE_OTHER): Payer: Self-pay | Admitting: *Deleted

## 2018-11-13 ENCOUNTER — Other Ambulatory Visit: Payer: Self-pay

## 2018-11-13 VITALS — BP 142/86 | HR 67 | Temp 98.2°F | Wt 155.5 lb

## 2018-11-13 DIAGNOSIS — Z006 Encounter for examination for normal comparison and control in clinical research program: Secondary | ICD-10-CM

## 2018-11-13 NOTE — Research (Signed)
Mason Morales is here for his week 105 HPTN083 visit. No new complaints or medications. States that he did quit his job last week. He says he feel "so much better" since leaving his job. States that he is already receiving job interview request. Plans to take at least 1-2 months off before going back to work. He was unblinded to truvada. Verbalized interest in injectable cabotegravir once available. Rapid HIV non-reactive. He will return on 01/08/19 for his next study visit.

## 2018-11-14 LAB — COMPREHENSIVE METABOLIC PANEL
AG Ratio: 1.6 (calc) (ref 1.0–2.5)
ALT: 10 U/L (ref 9–46)
AST: 18 U/L (ref 10–35)
Albumin: 4.4 g/dL (ref 3.6–5.1)
Alkaline phosphatase (APISO): 71 U/L (ref 35–144)
BUN: 15 mg/dL (ref 7–25)
CO2: 23 mmol/L (ref 20–32)
Calcium: 9.3 mg/dL (ref 8.6–10.3)
Chloride: 103 mmol/L (ref 98–110)
Creat: 1.09 mg/dL (ref 0.70–1.25)
Globulin: 2.8 g/dL (calc) (ref 1.9–3.7)
Glucose, Bld: 100 mg/dL — ABNORMAL HIGH (ref 65–99)
Potassium: 4.1 mmol/L (ref 3.5–5.3)
Sodium: 140 mmol/L (ref 135–146)
Total Bilirubin: 0.5 mg/dL (ref 0.2–1.2)
Total Protein: 7.2 g/dL (ref 6.1–8.1)

## 2018-11-14 LAB — CBC WITH DIFFERENTIAL/PLATELET
Absolute Monocytes: 224 cells/uL (ref 200–950)
Basophils Absolute: 21 cells/uL (ref 0–200)
Basophils Relative: 0.6 %
Eosinophils Absolute: 11 cells/uL — ABNORMAL LOW (ref 15–500)
Eosinophils Relative: 0.3 %
HCT: 47 % (ref 38.5–50.0)
Hemoglobin: 15.7 g/dL (ref 13.2–17.1)
Lymphs Abs: 1992 cells/uL (ref 850–3900)
MCH: 30 pg (ref 27.0–33.0)
MCHC: 33.4 g/dL (ref 32.0–36.0)
MCV: 89.7 fL (ref 80.0–100.0)
MPV: 9.7 fL (ref 7.5–12.5)
Monocytes Relative: 6.4 %
Neutro Abs: 1253 cells/uL — ABNORMAL LOW (ref 1500–7800)
Neutrophils Relative %: 35.8 %
Platelets: 221 10*3/uL (ref 140–400)
RBC: 5.24 10*6/uL (ref 4.20–5.80)
RDW: 12.8 % (ref 11.0–15.0)
Total Lymphocyte: 56.9 %
WBC: 3.5 10*3/uL — ABNORMAL LOW (ref 3.8–10.8)

## 2018-11-14 LAB — CK: Total CK: 82 U/L (ref 44–196)

## 2018-11-14 LAB — URINALYSIS, ROUTINE W REFLEX MICROSCOPIC
Bilirubin Urine: NEGATIVE
Glucose, UA: NEGATIVE
Hgb urine dipstick: NEGATIVE
Ketones, ur: NEGATIVE
Leukocytes,Ua: NEGATIVE
Nitrite: NEGATIVE
Protein, ur: NEGATIVE
Specific Gravity, Urine: 1.02 (ref 1.001–1.03)
pH: 5.5 (ref 5.0–8.0)

## 2018-11-14 LAB — LIPID PANEL
Cholesterol: 161 mg/dL (ref <200)
HDL: 54 mg/dL (ref >=40)
LDL Cholesterol (Calc): 93 mg/dL (calc)
Non-HDL Cholesterol (Calc): 107 mg/dL (calc) (ref <130)
Total CHOL/HDL Ratio: 3 (calc) (ref <5.0)
Triglycerides: 62 mg/dL (ref <150)

## 2018-11-14 LAB — C. TRACHOMATIS/N. GONORRHOEAE RNA
C. trachomatis RNA, TMA: NOT DETECTED
N. gonorrhoeae RNA, TMA: NOT DETECTED

## 2018-11-14 LAB — HIV ANTIBODY (ROUTINE TESTING W REFLEX): HIV 1&2 Ab, 4th Generation: NONREACTIVE

## 2018-11-14 LAB — HEPATITIS C ANTIBODY
Hepatitis C Ab: NONREACTIVE
SIGNAL TO CUT-OFF: 0.1 (ref <1.00)

## 2018-11-14 LAB — PHOSPHORUS: Phosphorus: 2.5 mg/dL (ref 2.5–4.5)

## 2018-11-14 LAB — AMYLASE: Amylase: 64 U/L (ref 21–101)

## 2018-11-14 LAB — LIPASE: Lipase: 32 U/L (ref 7–60)

## 2018-11-14 LAB — RPR TITER: RPR Titer: 1:2 {titer} — ABNORMAL HIGH

## 2018-11-14 LAB — RPR: RPR Ser Ql: REACTIVE — AB

## 2018-11-14 LAB — FLUORESCENT TREPONEMAL AB(FTA)-IGG-BLD: Fluorescent Treponemal ABS: REACTIVE — AB

## 2018-11-16 LAB — CT/NG RNA, TMA RECTAL
Chlamydia Trachomatis RNA: NOT DETECTED
Neisseria Gonorrhoeae RNA: NOT DETECTED

## 2018-12-18 ENCOUNTER — Other Ambulatory Visit: Payer: Self-pay

## 2018-12-18 DIAGNOSIS — Z20822 Contact with and (suspected) exposure to covid-19: Secondary | ICD-10-CM

## 2018-12-19 LAB — NOVEL CORONAVIRUS, NAA: SARS-CoV-2, NAA: DETECTED — AB

## 2018-12-21 ENCOUNTER — Telehealth (HOSPITAL_COMMUNITY): Payer: Self-pay

## 2018-12-21 NOTE — Telephone Encounter (Signed)
Spoke with patient and advised of positive coronavirus test result. Stated he had already viewed in [place], We reviewed current signs and symptoms.  Patient stated not experienced any symptoms.  Advised to report to the emergency room if he experienced any shortness of breath or high fevers.  Also instructed on Tylenol use for fever reduction.  Patient aware health department will be contacted regarding positive test.

## 2018-12-23 ENCOUNTER — Other Ambulatory Visit: Payer: Self-pay | Admitting: Internal Medicine

## 2018-12-23 MED ORDER — AMLODIPINE BESYLATE 10 MG PO TABS: 10.0000 mg | ORAL_TABLET | Freq: Every day | ORAL | 0 refills | Status: DC

## 2018-12-23 MED ORDER — CITALOPRAM HYDROBROMIDE 20 MG PO TABS: 20.0000 mg | ORAL_TABLET | Freq: Every day | ORAL | 0 refills | Status: DC

## 2018-12-23 NOTE — Telephone Encounter (Signed)
Requested medication (s) are due for refill today: yes  Requested medication (s) are on the active medication list: yes  Last refill:  8 months ago  Future visit scheduled: no  Notes to clinic:  Review for refill  Requested Prescriptions  Pending Prescriptions Disp Refills   amLODipine (NORVASC) 10 MG tablet 90 tablet 3    Sig: Take 1 tablet (10 mg total) by mouth daily.     Cardiovascular:  Calcium Channel Blockers Failed - 12/23/2018  9:38 AM      Failed - Last BP in normal range    BP Readings from Last 1 Encounters:  11/13/18 (!) 142/86         Failed - Valid encounter within last 6 months    Recent Outpatient Visits          8 months ago Preventative health care   [facility] -Georges Mouse, MD   1 year ago Left lumbar radiculopathy   [facility] -Georges Mouse, MD   2 years ago Encounter for well adult exam with abnormal findings   [place] John, James W, MD   2 years ago Urinary frequency   [place] John, James W, MD   2 years ago Encounter for well adult exam with abnormal findings   [place], James W, MD              citalopram (CELEXA) 20 MG tablet 90 tablet 3    Sig: Take 1 tablet (20 mg total) by mouth daily.     Psychiatry:  Antidepressants - SSRI Failed - 12/23/2018  9:38 AM      Failed - Valid encounter within last 6 months    Recent Outpatient Visits          8 months ago Preventative health care   [facility] -Georges Mouse, MD   1 year ago Left lumbar radiculopathy   [facility] -Georges Mouse, MD   2 years ago Encounter for well adult exam with abnormal findings   [place] John, James W, MD   2 years ago Urinary frequency   [place], [place]   2 years ago Encounter for well adult exam with  abnormal findings   [facility] -Georges Mouse, MD             Failed - Completed PHQ-2 or PHQ-9 in the last 360 days.

## 2018-12-23 NOTE — Telephone Encounter (Signed)
Medication Refill - Medication: amLODipine (NORVASC) 10 MG tablet,  citalopram (CELEXA) 20 MG tablet, pantoprazole (PROTONIX) 40 MG tablet, Linzess 72 Mcg Capsule   Pharmacy request.   Preferred Pharmacy:  [facility] [place] [phone] (Phone) [phone] (Fax)

## 2019-01-02 ENCOUNTER — Telehealth: Payer: Self-pay | Admitting: Internal Medicine

## 2019-01-02 DIAGNOSIS — Z20828 Contact with and (suspected) exposure to other viral communicable diseases: Secondary | ICD-10-CM

## 2019-01-02 DIAGNOSIS — Z20822 Contact with and (suspected) exposure to covid-19: Secondary | ICD-10-CM

## 2019-01-02 NOTE — Telephone Encounter (Signed)
I have no ability to order Rapid COVID testing as this is only available in the Emergency room  I placed an order for the regular COVID testing where the results come back within 1-2 days  - ok to go to the [facility] testing site before 330 pm

## 2019-01-02 NOTE — Telephone Encounter (Signed)
Pt has been informed.

## 2019-01-02 NOTE — Telephone Encounter (Signed)
Patient father passed and would like to know if PCP can place orders or recommended where he can  Have a rapid COVID test done due to patient father funeral being on Monday. Patient was tested positive for COVID 2 weeks ago

## 2019-01-02 NOTE — Addendum Note (Signed)
Addended by: Biagio Borg on: 01/02/2019 12:06 PM   Modules accepted: Orders

## 2019-01-08 ENCOUNTER — Encounter (INDEPENDENT_AMBULATORY_CARE_PROVIDER_SITE_OTHER): Payer: Self-pay | Admitting: *Deleted

## 2019-01-08 ENCOUNTER — Other Ambulatory Visit: Payer: Self-pay

## 2019-01-08 VITALS — BP 150/89 | HR 68 | Temp 98.0°F | Wt 170.0 lb

## 2019-01-08 DIAGNOSIS — Z006 Encounter for examination for normal comparison and control in clinical research program: Secondary | ICD-10-CM

## 2019-01-08 NOTE — Research (Signed)
Hanad is here for his week [place] visit. He tested positive for COVID on 12/18/18. He was tested after his father, who he lived with, tested positive. States that he never had any symptoms and that he has self-quarantined since testing positive. States that his dad was hospitalized and that he passed away last 10-Aug-2022. Emotional support provided. Encouraged him to reach out to [place] if he needs any assistance during this difficult time. States that he has good support system with family and friends. States that his appetite has been good and his weight is back to baseline. Adherence only 50% by pill count. States that he thought he had done better than that with taking his truvada. We discussed strategies to help improve his adherence. Plans to set his phone alarm and to take his medications in the morning with breakfast. States that he has not been sexually active in the past several weeks. Rapid HIV was non-reactive today. Instructed his to use condoms especially until his adherence has improved with his truvada. Verbally agreed. He will return in November for his next study visit.

## 2019-01-09 LAB — COMPREHENSIVE METABOLIC PANEL
AG Ratio: 1.4 (calc) (ref 1.0–2.5)
ALT: 11 U/L (ref 9–46)
AST: 16 U/L (ref 10–35)
Albumin: 4 g/dL (ref 3.6–5.1)
Alkaline phosphatase (APISO): 68 U/L (ref 35–144)
BUN: 15 mg/dL (ref 7–25)
CO2: 27 mmol/L (ref 20–32)
Calcium: 9.3 mg/dL (ref 8.6–10.3)
Chloride: 105 mmol/L (ref 98–110)
Creat: 1.08 mg/dL (ref 0.70–1.25)
Globulin: 2.8 g/dL (calc) (ref 1.9–3.7)
Glucose, Bld: 87 mg/dL (ref 65–99)
Potassium: 4 mmol/L (ref 3.5–5.3)
Sodium: 139 mmol/L (ref 135–146)
Total Bilirubin: 0.3 mg/dL (ref 0.2–1.2)
Total Protein: 6.8 g/dL (ref 6.1–8.1)

## 2019-01-09 LAB — CBC WITH DIFFERENTIAL/PLATELET
Absolute Monocytes: 400 cells/uL (ref 200–950)
Basophils Absolute: 30 cells/uL (ref 0–200)
Basophils Relative: 0.6 %
Eosinophils Absolute: 40 cells/uL (ref 15–500)
Eosinophils Relative: 0.8 %
HCT: 41.7 % (ref 38.5–50.0)
Hemoglobin: 14 g/dL (ref 13.2–17.1)
Lymphs Abs: 2755 cells/uL (ref 850–3900)
MCH: 30 pg (ref 27.0–33.0)
MCHC: 33.6 g/dL (ref 32.0–36.0)
MCV: 89.3 fL (ref 80.0–100.0)
MPV: 9.8 fL (ref 7.5–12.5)
Monocytes Relative: 8 %
Neutro Abs: 1775 cells/uL (ref 1500–7800)
Neutrophils Relative %: 35.5 %
Platelets: 227 10*3/uL (ref 140–400)
RBC: 4.67 10*6/uL (ref 4.20–5.80)
RDW: 13 % (ref 11.0–15.0)
Total Lymphocyte: 55.1 %
WBC: 5 10*3/uL (ref 3.8–10.8)

## 2019-01-09 LAB — CK: Total CK: 129 U/L (ref 44–196)

## 2019-01-09 LAB — PHOSPHORUS: Phosphorus: 3.7 mg/dL (ref 2.5–4.5)

## 2019-01-09 LAB — HIV ANTIBODY (ROUTINE TESTING W REFLEX): HIV 1&2 Ab, 4th Generation: NONREACTIVE

## 2019-01-09 LAB — AMYLASE: Amylase: 73 U/L (ref 21–101)

## 2019-01-09 LAB — LIPASE: Lipase: 45 U/L (ref 7–60)

## 2019-01-28 IMAGING — CT CT ABD-PELV W/ CM
2 of 5 series · 16 of 46 positions shown, 18 images · IV contrast (Omni 300)
Comparison: 10/25/2016

CLINICAL DATA: RIGHT lower quadrant pain after falling off a horse,
blunt abdominal trauma with microscopic hematuria, history kidney
stones, hypertension GERD, former smoker

EXAM:
CT ABDOMEN AND PELVIS WITH CONTRAST
TECHNIQUE: Multidetector CT imaging of the abdomen and pelvis was performed
using the standard protocol following bolus administration of
intravenous contrast. Sagittal and coronal MPR images reconstructed
from axial data set.
CONTRAST:  100mL [id number] IOPAMIDOL ([id number]) INJECTION 61% IV.
No oral contrast was administered.

[Series 3: a/p w/ 5mm · axial · 0.71mm/px · z∈[+833,+1218]mm · 13 of 87 slices shown, 15 images]
[im 5/87  soft-tissue]
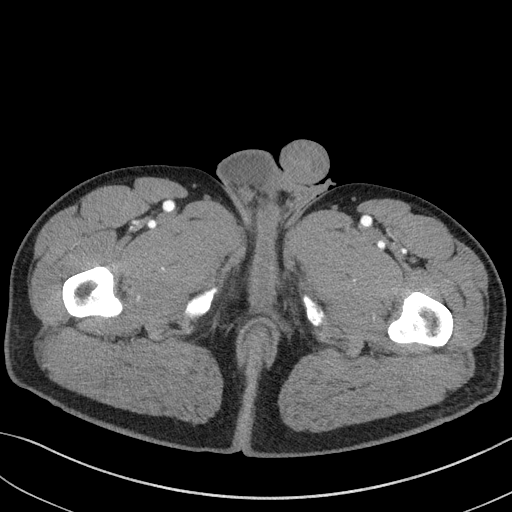
[im 5/87  bone]
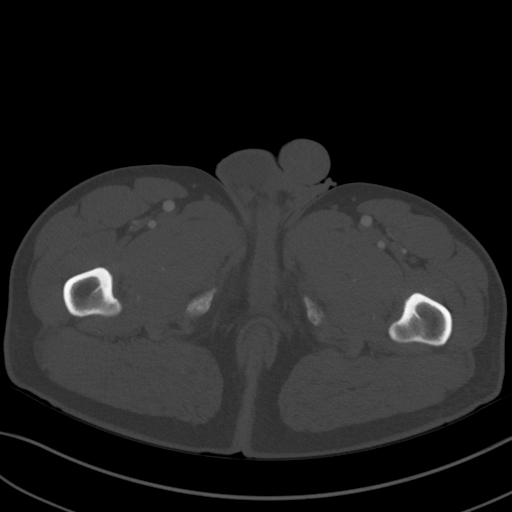
[im 14/87  soft-tissue]
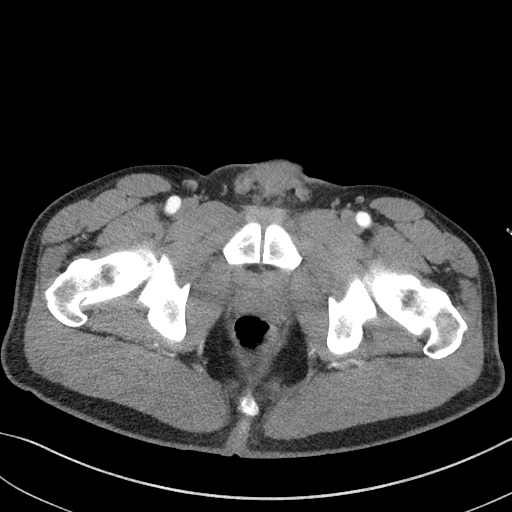
[im 19/87  soft-tissue]
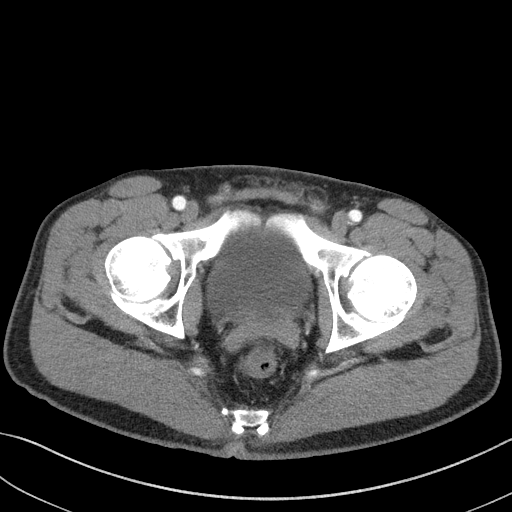
[im 23/87  soft-tissue]
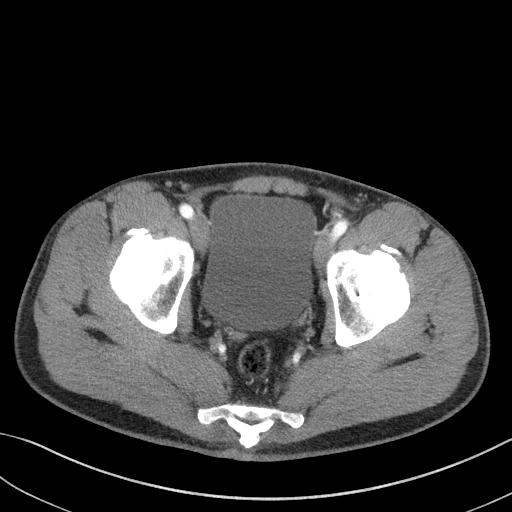
[im 32/87  soft-tissue]
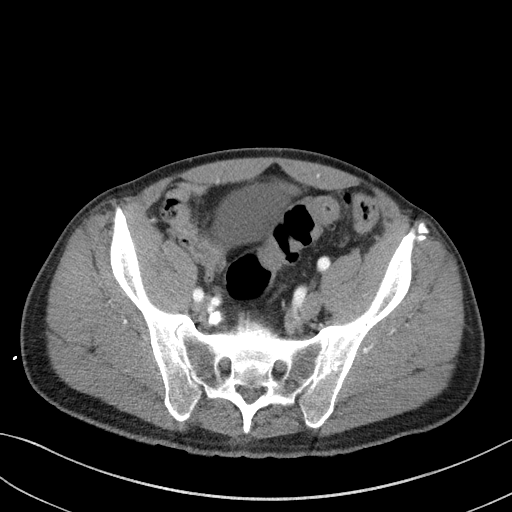
[im 37/87  soft-tissue]
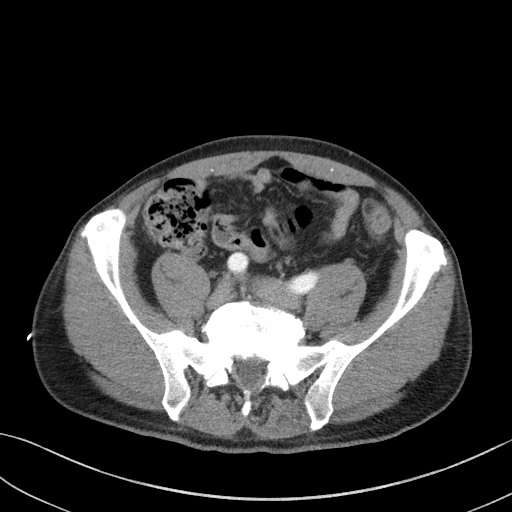
[im 46/87  soft-tissue]
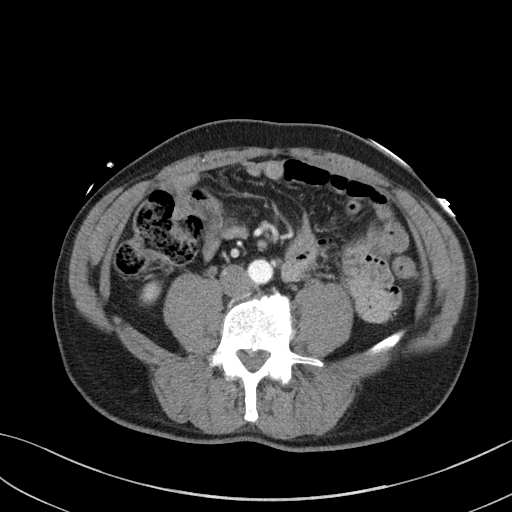
[im 50/87  soft-tissue]
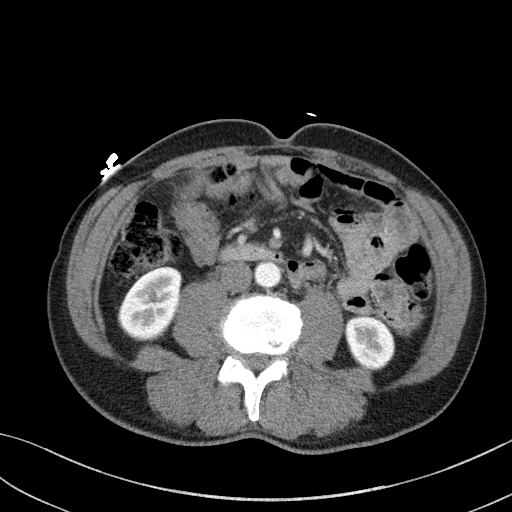
[im 55/87  soft-tissue]
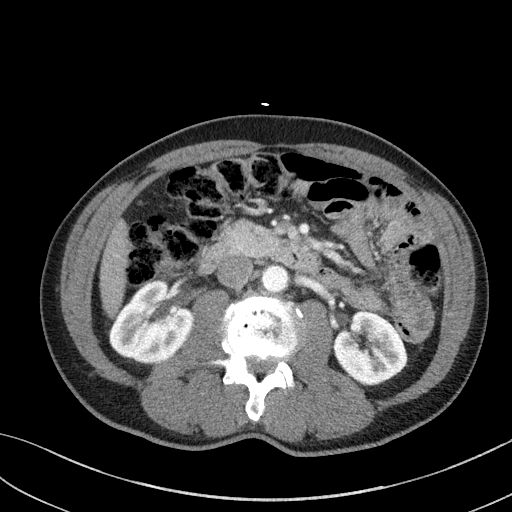
[im 55/87  bone]
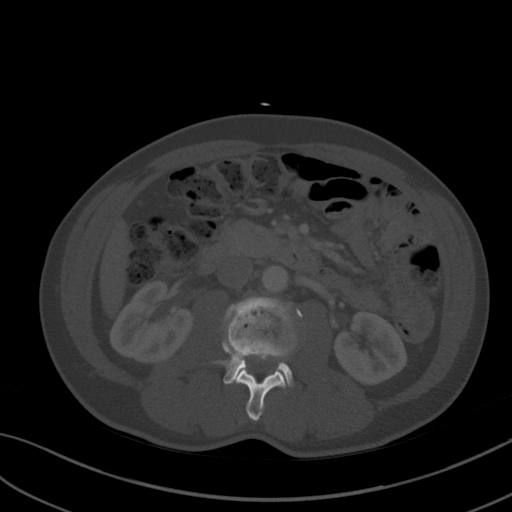
[im 64/87  soft-tissue]
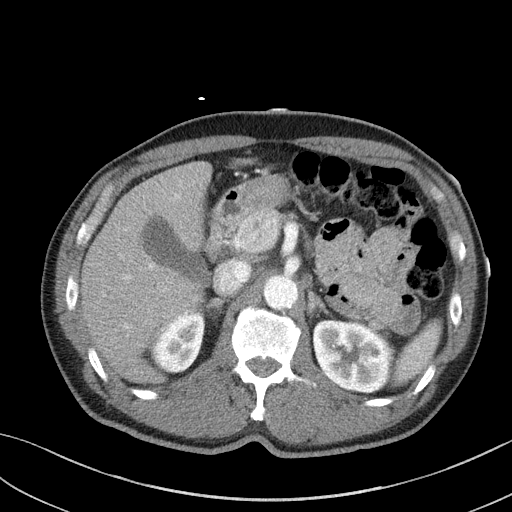
[im 68/87  soft-tissue]
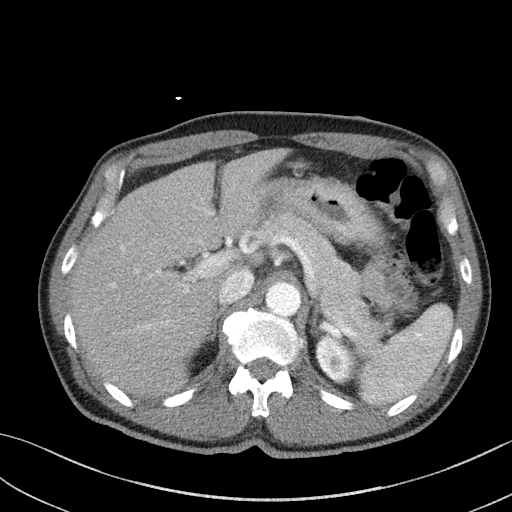
[im 73/87  soft-tissue]
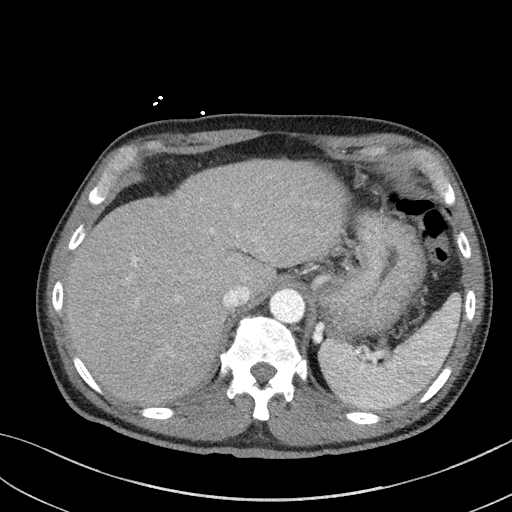
[im 82/87  soft-tissue]
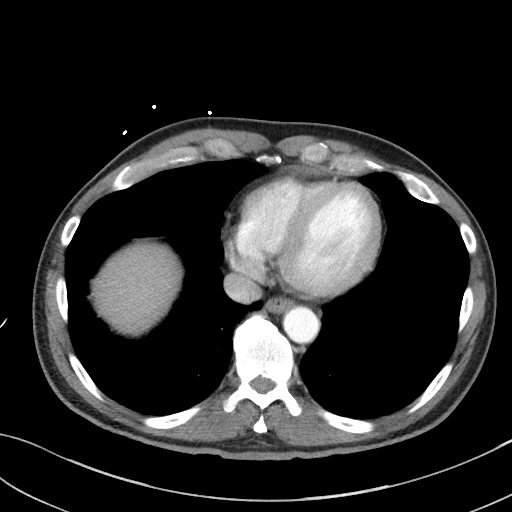

[Series 6: a/p w/ cor · coronal · 0.72mm/px · 3 of 146 slices shown]
[im 49/146  soft-tissue]
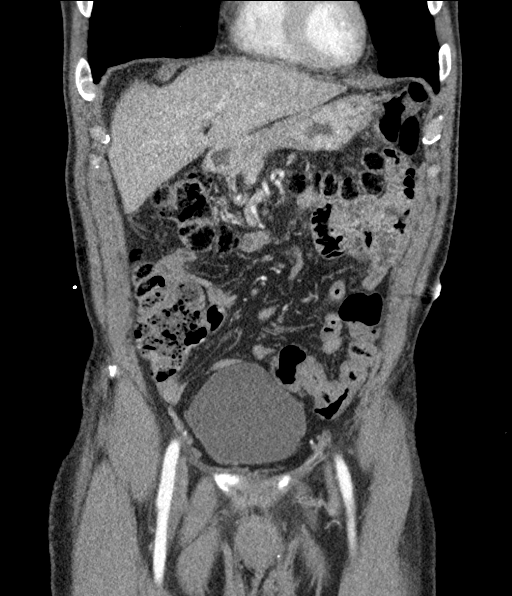
[im 65/146  soft-tissue]
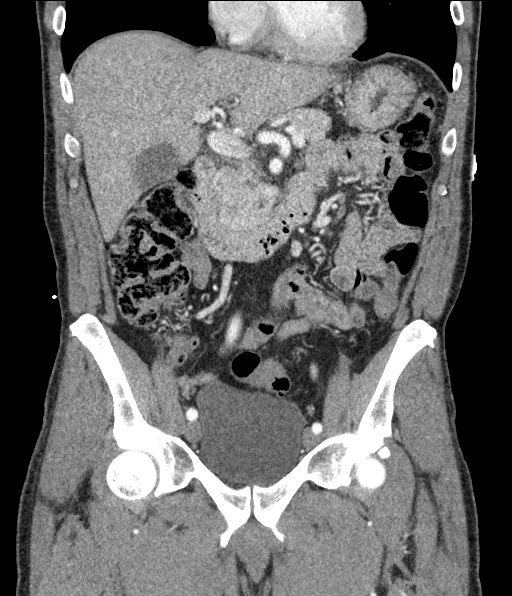
[im 81/146  soft-tissue]
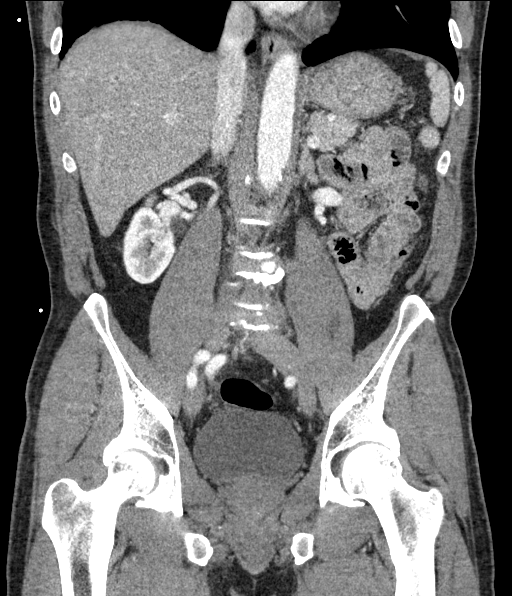

[16 of 46 positions shown; findings below may reference images not displayed]

FINDINGS: Lower chest: Lung bases clear

Hepatobiliary: Gallbladder and liver normal appearance

Pancreas: Normal appearance

Spleen: Normal appearance

Adrenals/Urinary Tract: Tiny LEFT renal cyst. Adrenal glands,
kidneys, ureters, and bladder otherwise normal appearance. No renal
injury or perinephric stranding.

Stomach/Bowel: Normal appendix. Stomach and bowel loops normal
appearance.

Vascular/Lymphatic: Vascular structures unremarkable. No adenopathy.

Reproductive: Prostate gland and seminal vesicles unremarkable

Other: No free air or free fluid. No hernia or inflammatory process.
No obvious hematomas.

Musculoskeletal: Scattered degenerative disc disease changes lumbar
spine. No acute osseous findings. Central acquired spinal stenosis
at L4-L5, multifactorial.
IMPRESSION: No acute intra-abdominal or intrapelvic injuries identified.

Degenerative disc disease changes lumbar spine with central acquired
spinal stenosis at L4-L5.

## 2019-03-05 ENCOUNTER — Encounter (INDEPENDENT_AMBULATORY_CARE_PROVIDER_SITE_OTHER): Payer: BC Managed Care – PPO | Admitting: *Deleted

## 2019-03-05 ENCOUNTER — Other Ambulatory Visit: Payer: Self-pay

## 2019-03-05 VITALS — BP 119/73 | HR 71 | Temp 97.7°F | Wt 166.4 lb

## 2019-03-05 DIAGNOSIS — Z006 Encounter for examination for normal comparison and control in clinical research program: Secondary | ICD-10-CM

## 2019-03-05 NOTE — Research (Signed)
Mason Morales is here for his week [place] visit. No new complaints or medications. Excellent adherence with study medication, 100% by pill count.  Rapid HIV non-reactive. He will return in January for his next study visit.

## 2019-03-06 LAB — COMPREHENSIVE METABOLIC PANEL
AG Ratio: 1.7 (calc) (ref 1.0–2.5)
ALT: 15 U/L (ref 9–46)
AST: 17 U/L (ref 10–35)
Albumin: 4.5 g/dL (ref 3.6–5.1)
Alkaline phosphatase (APISO): 75 U/L (ref 35–144)
BUN/Creatinine Ratio: 15 (calc) (ref 6–22)
BUN: 28 mg/dL — ABNORMAL HIGH (ref 7–25)
CO2: 25 mmol/L (ref 20–32)
Calcium: 9.9 mg/dL (ref 8.6–10.3)
Chloride: 101 mmol/L (ref 98–110)
Creat: 1.91 mg/dL — ABNORMAL HIGH (ref 0.70–1.25)
Globulin: 2.7 g/dL (calc) (ref 1.9–3.7)
Glucose, Bld: 92 mg/dL (ref 65–99)
Potassium: 4.1 mmol/L (ref 3.5–5.3)
Sodium: 136 mmol/L (ref 135–146)
Total Bilirubin: 0.5 mg/dL (ref 0.2–1.2)
Total Protein: 7.2 g/dL (ref 6.1–8.1)

## 2019-03-06 LAB — HIV ANTIBODY (ROUTINE TESTING W REFLEX): HIV 1&2 Ab, 4th Generation: NONREACTIVE

## 2019-03-06 LAB — PHOSPHORUS: Phosphorus: 4.4 mg/dL (ref 2.5–4.5)

## 2019-03-06 LAB — CBC WITH DIFFERENTIAL/PLATELET
Absolute Monocytes: 501 cells/uL (ref 200–950)
Basophils Absolute: 22 cells/uL (ref 0–200)
Basophils Relative: 0.4 %
Eosinophils Absolute: 61 cells/uL (ref 15–500)
Eosinophils Relative: 1.1 %
HCT: 42.3 % (ref 38.5–50.0)
Hemoglobin: 14.3 g/dL (ref 13.2–17.1)
Lymphs Abs: 2822 cells/uL (ref 850–3900)
MCH: 30.2 pg (ref 27.0–33.0)
MCHC: 33.8 g/dL (ref 32.0–36.0)
MCV: 89.2 fL (ref 80.0–100.0)
MPV: 10.2 fL (ref 7.5–12.5)
Monocytes Relative: 9.1 %
Neutro Abs: 2096 cells/uL (ref 1500–7800)
Neutrophils Relative %: 38.1 %
Platelets: 194 10*3/uL (ref 140–400)
RBC: 4.74 10*6/uL (ref 4.20–5.80)
RDW: 13.5 % (ref 11.0–15.0)
Total Lymphocyte: 51.3 %
WBC: 5.5 10*3/uL (ref 3.8–10.8)

## 2019-03-06 LAB — AMYLASE: Amylase: 73 U/L (ref 21–101)

## 2019-03-06 LAB — LIPASE: Lipase: 74 U/L — ABNORMAL HIGH (ref 7–60)

## 2019-03-06 LAB — CK: Total CK: 145 U/L (ref 44–196)

## 2019-03-11 ENCOUNTER — Encounter: Payer: Self-pay | Admitting: *Deleted

## 2019-03-12 ENCOUNTER — Encounter: Payer: Self-pay | Admitting: *Deleted

## 2019-03-16 ENCOUNTER — Encounter: Payer: Self-pay | Admitting: *Deleted

## 2019-03-17 ENCOUNTER — Encounter (INDEPENDENT_AMBULATORY_CARE_PROVIDER_SITE_OTHER): Payer: Self-pay | Admitting: *Deleted

## 2019-03-17 ENCOUNTER — Other Ambulatory Visit: Payer: Self-pay

## 2019-03-17 VITALS — BP 154/89 | HR 71 | Temp 97.7°F | Wt 166.1 lb

## 2019-03-17 DIAGNOSIS — Z006 Encounter for examination for normal comparison and control in clinical research program: Secondary | ICD-10-CM

## 2019-03-17 LAB — BASIC METABOLIC PANEL
BUN/Creatinine Ratio: 12 (calc) (ref 6–22)
BUN: 15 mg/dL (ref 7–25)
CO2: 29 mmol/L (ref 20–32)
Calcium: 9.2 mg/dL (ref 8.6–10.3)
Chloride: 102 mmol/L (ref 98–110)
Creat: 1.3 mg/dL — ABNORMAL HIGH (ref 0.70–1.25)
Glucose, Bld: 100 mg/dL — ABNORMAL HIGH (ref 65–99)
Potassium: 3.8 mmol/L (ref 3.5–5.3)
Sodium: 138 mmol/L (ref 135–146)

## 2019-03-17 NOTE — Research (Signed)
Mason Morales is here for repeat blood work after having an increase creatinine and decreased CrCl at his last visit. He says that he has been trying to hydrate well each day and has avoided any alcohol intake. Study medication currently on hold. He understands that we will notify him of his lab results and provide further instructions regarding his study medication once results from today are available. No new complaints or concerns verbalized. His next study visit is scheduled for 04/30/19.

## 2019-04-30 ENCOUNTER — Other Ambulatory Visit: Payer: Self-pay

## 2019-04-30 ENCOUNTER — Encounter (INDEPENDENT_AMBULATORY_CARE_PROVIDER_SITE_OTHER): Payer: Self-pay | Admitting: *Deleted

## 2019-04-30 VITALS — BP 154/90 | HR 76 | Temp 98.1°F | Wt 166.1 lb

## 2019-04-30 DIAGNOSIS — Z006 Encounter for examination for normal comparison and control in clinical research program: Secondary | ICD-10-CM

## 2019-04-30 NOTE — Research (Signed)
Mason Morales is here for his week 129 HPTN 083 visit. BP elevated today. States that he has not taken his BP medication this morning. Plans to take it once he gets home. Excellent adherence with his oral study medication. Rapid HIV non-reactive. He will return in March for his next study visit.

## 2019-05-01 LAB — C. TRACHOMATIS/N. GONORRHOEAE RNA
C. trachomatis RNA, TMA: NOT DETECTED
N. gonorrhoeae RNA, TMA: NOT DETECTED

## 2019-05-01 LAB — COMPREHENSIVE METABOLIC PANEL
AG Ratio: 1.5 (calc) (ref 1.0–2.5)
ALT: 9 U/L (ref 9–46)
AST: 18 U/L (ref 10–35)
Albumin: 4.5 g/dL (ref 3.6–5.1)
Alkaline phosphatase (APISO): 81 U/L (ref 35–144)
BUN: 13 mg/dL (ref 7–25)
CO2: 28 mmol/L (ref 20–32)
Calcium: 10 mg/dL (ref 8.6–10.3)
Chloride: 102 mmol/L (ref 98–110)
Creat: 1.15 mg/dL (ref 0.70–1.25)
Globulin: 3.1 g/dL (calc) (ref 1.9–3.7)
Glucose, Bld: 92 mg/dL (ref 65–99)
Potassium: 4.2 mmol/L (ref 3.5–5.3)
Sodium: 138 mmol/L (ref 135–146)
Total Bilirubin: 0.6 mg/dL (ref 0.2–1.2)
Total Protein: 7.6 g/dL (ref 6.1–8.1)

## 2019-05-01 LAB — HIV ANTIBODY (ROUTINE TESTING W REFLEX): HIV 1&2 Ab, 4th Generation: NONREACTIVE

## 2019-05-01 LAB — CBC WITH DIFFERENTIAL/PLATELET
Absolute Monocytes: 256 cells/uL (ref 200–950)
Basophils Absolute: 29 cells/uL (ref 0–200)
Basophils Relative: 0.7 %
Eosinophils Absolute: 38 cells/uL (ref 15–500)
Eosinophils Relative: 0.9 %
HCT: 47.1 % (ref 38.5–50.0)
Hemoglobin: 16.2 g/dL (ref 13.2–17.1)
Lymphs Abs: 2045 cells/uL (ref 850–3900)
MCH: 30.8 pg (ref 27.0–33.0)
MCHC: 34.4 g/dL (ref 32.0–36.0)
MCV: 89.5 fL (ref 80.0–100.0)
MPV: 9.7 fL (ref 7.5–12.5)
Monocytes Relative: 6.1 %
Neutro Abs: 1831 cells/uL (ref 1500–7800)
Neutrophils Relative %: 43.6 %
Platelets: 208 10*3/uL (ref 140–400)
RBC: 5.26 10*6/uL (ref 4.20–5.80)
RDW: 13.2 % (ref 11.0–15.0)
Total Lymphocyte: 48.7 %
WBC: 4.2 10*3/uL (ref 3.8–10.8)

## 2019-05-01 LAB — PHOSPHORUS: Phosphorus: 2 mg/dL — ABNORMAL LOW (ref 2.5–4.5)

## 2019-05-01 LAB — RPR TITER: RPR Titer: 1:2 {titer} — ABNORMAL HIGH

## 2019-05-01 LAB — FLUORESCENT TREPONEMAL AB(FTA)-IGG-BLD: Fluorescent Treponemal ABS: REACTIVE — AB

## 2019-05-01 LAB — RPR: RPR Ser Ql: REACTIVE — AB

## 2019-05-01 LAB — CK: Total CK: 119 U/L (ref 44–196)

## 2019-05-01 LAB — AMYLASE: Amylase: 64 U/L (ref 21–101)

## 2019-05-01 LAB — LIPASE: Lipase: 33 U/L (ref 7–60)

## 2019-05-06 LAB — CT/NG RNA, TMA RECTAL
Chlamydia Trachomatis RNA: NOT DETECTED
Neisseria Gonorrhoeae RNA: NOT DETECTED

## 2019-06-18 IMAGING — CR DG ABDOMEN ACUTE W/ 1V CHEST
3 series · 3 of 3 positions shown · non-contrast
Comparison: Chest 08/07/2017

CLINICAL DATA: Abdominal pain for couple of months, worse tonight.

EXAM:
DG ABDOMEN ACUTE W/ 1V CHEST

[chest pa]
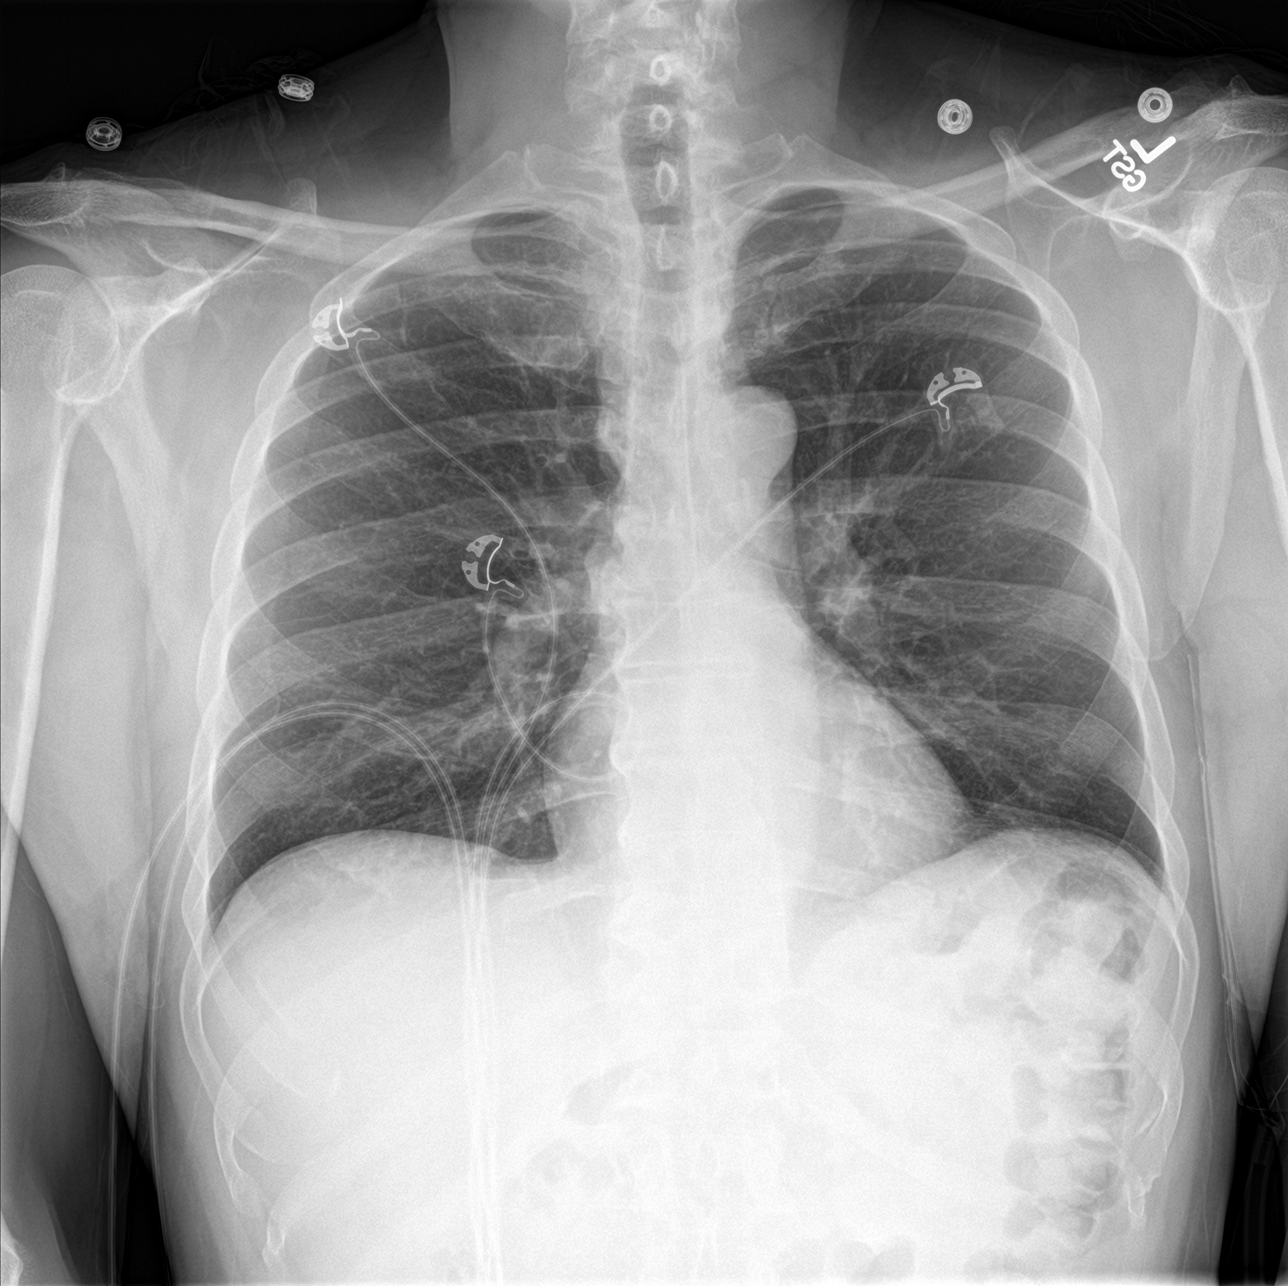

[abdomen erect]
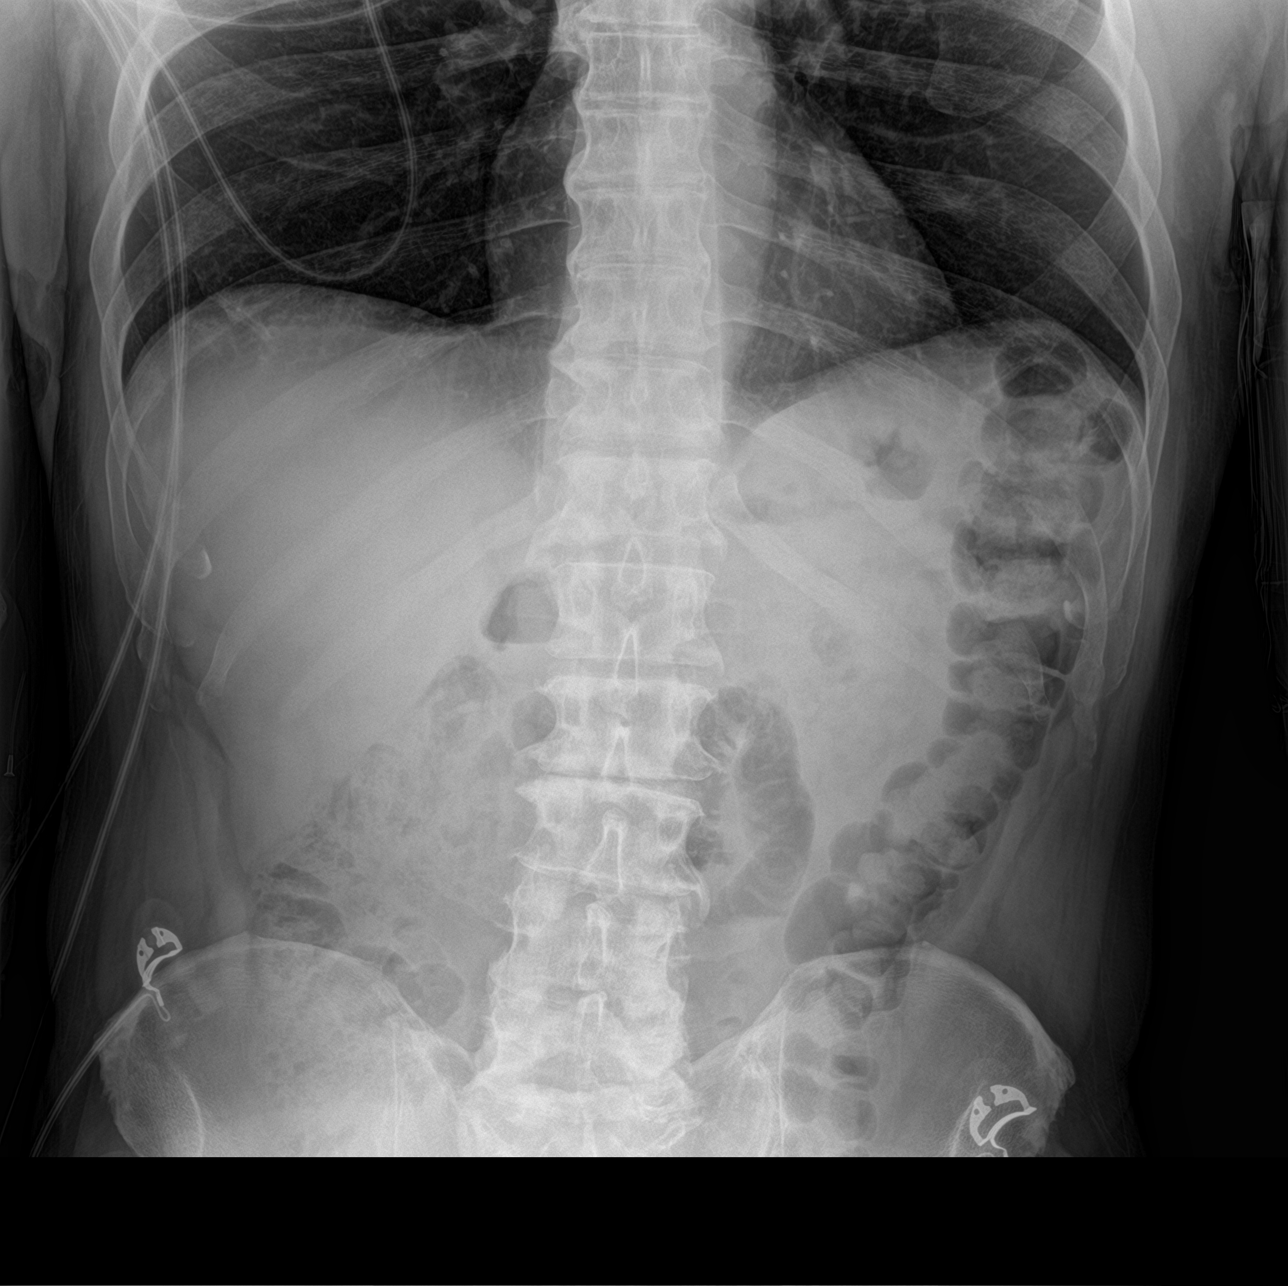

[abdomen supine]
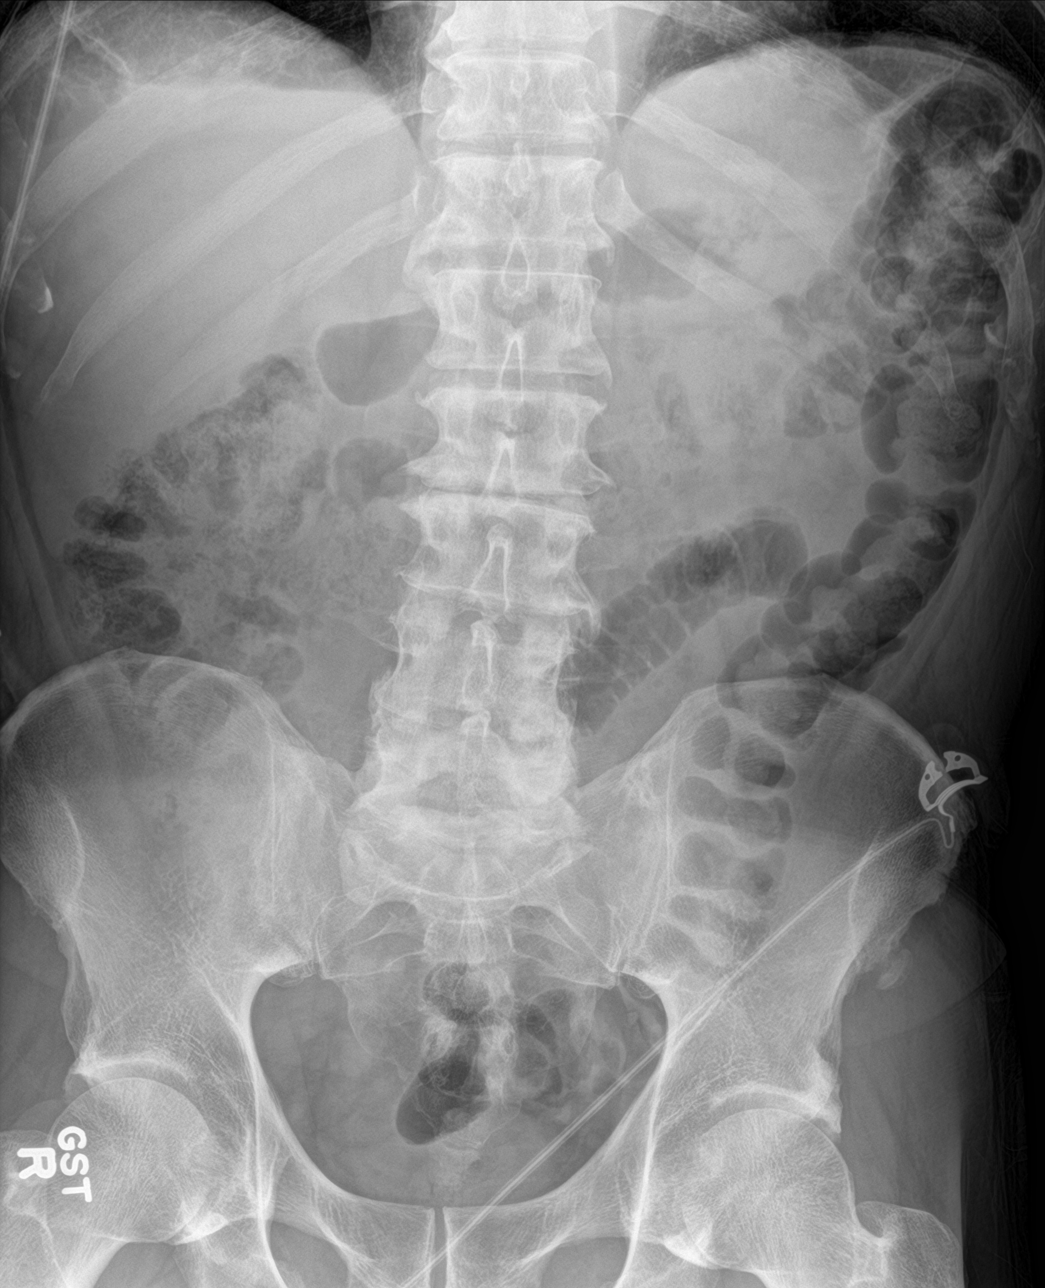

[3 of 3 positions shown; findings below may reference images not displayed]

FINDINGS: Normal heart size and pulmonary vascularity. No focal airspace
disease or consolidation in the lungs. No blunting of costophrenic
angles. No pneumothorax. Mediastinal contours appear intact.

Scattered gas and stool in the colon. No small or large bowel
distention. No free intra-abdominal air. No abnormal air-fluid
levels. No radiopaque stones. Degenerative changes in the spine..
IMPRESSION: 1. No evidence of active pulmonary disease.
2. Normal nonobstructive bowel gas pattern.

## 2019-06-25 ENCOUNTER — Other Ambulatory Visit: Payer: Self-pay

## 2019-06-25 ENCOUNTER — Encounter (INDEPENDENT_AMBULATORY_CARE_PROVIDER_SITE_OTHER): Payer: Self-pay | Admitting: *Deleted

## 2019-06-25 VITALS — BP 150/86 | HR 72 | Temp 97.7°F | Wt 164.2 lb

## 2019-06-25 DIAGNOSIS — Z006 Encounter for examination for normal comparison and control in clinical research program: Secondary | ICD-10-CM

## 2019-06-25 NOTE — Research (Signed)
Mason Morales is here for his week 137 HPTN 083 visit. No new complaints or medication. Adherence at 79% by pill count with his study provided truvada. We discussed strategies to help improve his adherence. Plans to start using a pill box and taking his mediation in the morning with breakfast. 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 He will return in April for his next study visit.

## 2019-06-26 LAB — COMPREHENSIVE METABOLIC PANEL
AG Ratio: 1.4 (calc) (ref 1.0–2.5)
ALT: 12 U/L (ref 9–46)
AST: 18 U/L (ref 10–35)
Albumin: 4.2 g/dL (ref 3.6–5.1)
Alkaline phosphatase (APISO): 82 U/L (ref 35–144)
BUN: 17 mg/dL (ref 7–25)
CO2: 30 mmol/L (ref 20–32)
Calcium: 9.2 mg/dL (ref 8.6–10.3)
Chloride: 104 mmol/L (ref 98–110)
Creat: 1.21 mg/dL (ref 0.70–1.25)
Globulin: 3 g/dL (calc) (ref 1.9–3.7)
Glucose, Bld: 105 mg/dL — ABNORMAL HIGH (ref 65–99)
Potassium: 4.6 mmol/L (ref 3.5–5.3)
Sodium: 140 mmol/L (ref 135–146)
Total Bilirubin: 0.5 mg/dL (ref 0.2–1.2)
Total Protein: 7.2 g/dL (ref 6.1–8.1)

## 2019-06-26 LAB — CK: Total CK: 79 U/L (ref 44–196)

## 2019-06-26 LAB — CBC WITH DIFFERENTIAL/PLATELET
Absolute Monocytes: 260 cells/uL (ref 200–950)
Basophils Absolute: 20 cells/uL (ref 0–200)
Basophils Relative: 0.4 %
Eosinophils Absolute: 40 cells/uL (ref 15–500)
Eosinophils Relative: 0.8 %
HCT: 47.7 % (ref 38.5–50.0)
Hemoglobin: 16.6 g/dL (ref 13.2–17.1)
Lymphs Abs: 3325 cells/uL (ref 850–3900)
MCH: 31 pg (ref 27.0–33.0)
MCHC: 34.8 g/dL (ref 32.0–36.0)
MCV: 89.2 fL (ref 80.0–100.0)
MPV: 9.5 fL (ref 7.5–12.5)
Monocytes Relative: 5.2 %
Neutro Abs: 1355 cells/uL — ABNORMAL LOW (ref 1500–7800)
Neutrophils Relative %: 27.1 %
Platelets: 224 10*3/uL (ref 140–400)
RBC: 5.35 10*6/uL (ref 4.20–5.80)
RDW: 12.9 % (ref 11.0–15.0)
Total Lymphocyte: 66.5 %
WBC: 5 10*3/uL (ref 3.8–10.8)

## 2019-06-26 LAB — LIPASE: Lipase: 74 U/L — ABNORMAL HIGH (ref 7–60)

## 2019-06-26 LAB — PHOSPHORUS: Phosphorus: 2 mg/dL — ABNORMAL LOW (ref 2.5–4.5)

## 2019-06-26 LAB — HIV ANTIBODY (ROUTINE TESTING W REFLEX): HIV 1&2 Ab, 4th Generation: NONREACTIVE

## 2019-06-26 LAB — AMYLASE: Amylase: 82 U/L (ref 21–101)

## 2019-07-14 ENCOUNTER — Other Ambulatory Visit: Payer: Self-pay | Admitting: Internal Medicine

## 2019-07-14 NOTE — Telephone Encounter (Signed)
Refill denied pt need ov for refills. Last saw provider "2019".Marland KitchenRaechel Morales

## 2019-07-14 NOTE — Telephone Encounter (Signed)
1.Medication Requested:acetaminophen (TYLENOL) 325 MG tablet  2. Pharmacy (Name, Street, City):[facility] [address] [place], [place] - [address]  3. On Med List: Yes   4. Last Visit with PCP: 12.11.2019   5. Next visit date with PCP: no appt is made at this time    Agent: Please be advised that RX refills may take up to 3 business days. We ask that you follow-up with your pharmacy.

## 2019-07-15 ENCOUNTER — Other Ambulatory Visit: Payer: Self-pay | Admitting: Internal Medicine

## 2019-07-15 MED ORDER — AMLODIPINE BESYLATE 10 MG PO TABS: 10.0000 mg | ORAL_TABLET | Freq: Every day | ORAL | 0 refills | Status: DC

## 2019-07-21 ENCOUNTER — Telehealth: Payer: Self-pay

## 2019-07-21 NOTE — Telephone Encounter (Signed)
1.Medication Requested:amLODipine (NORVASC) 10 MG tablet  2. Pharmacy (Name, Street, City):[facility] [place], [place] - [address]  3. On Med List: Yes   4. Last Visit with PCP: 12.11.2019   5. Next visit date with PCP: patient lost his job /  3.31.2021    Agent: Please be advised that RX refills may take up to 3 business days. We ask that you follow-up with your pharmacy.

## 2019-07-21 NOTE — Telephone Encounter (Signed)
Please advise if you are okay with refill.

## 2019-07-22 ENCOUNTER — Encounter: Payer: Self-pay | Admitting: Internal Medicine

## 2019-07-22 ENCOUNTER — Ambulatory Visit (INDEPENDENT_AMBULATORY_CARE_PROVIDER_SITE_OTHER): Payer: Self-pay | Admitting: Internal Medicine

## 2019-07-22 ENCOUNTER — Other Ambulatory Visit: Payer: Self-pay

## 2019-07-22 VITALS — BP 180/88 | HR 60 | Temp 98.1°F | Ht 69.0 in | Wt 169.0 lb

## 2019-07-22 DIAGNOSIS — Z006 Encounter for examination for normal comparison and control in clinical research program: Secondary | ICD-10-CM

## 2019-07-22 DIAGNOSIS — I10 Essential (primary) hypertension: Secondary | ICD-10-CM

## 2019-07-22 DIAGNOSIS — K219 Gastro-esophageal reflux disease without esophagitis: Secondary | ICD-10-CM

## 2019-07-22 DIAGNOSIS — K5909 Other constipation: Secondary | ICD-10-CM

## 2019-07-22 MED ORDER — LACTULOSE 20 GM/30ML PO SOLN: ORAL | 1 refills | Status: DC

## 2019-07-22 MED ORDER — AMLODIPINE BESYLATE 10 MG PO TABS: 10.0000 mg | ORAL_TABLET | Freq: Every day | ORAL | 3 refills | Status: DC

## 2019-07-22 NOTE — Telephone Encounter (Signed)
lvm for pt informing PCP declined to rf. Reviewed chart and pt has an appointment today.

## 2019-07-22 NOTE — Assessment & Plan Note (Signed)
For linzess prn samples, also lactulose prn

## 2019-07-22 NOTE — Patient Instructions (Signed)
Ok to try the Lactulose for constipation if needed  Please continue all other medications as before, and refills have been done if requested - the amlodipine  You can also substitute the OTC Nexium up to 1 - 2 tab by mouth daily as needed instead of the omeprazole  Please have the pharmacy call with any other refills you may need.  Please keep your appointments with your specialists as you may have planned  Please make an Appointment to return in 6 months

## 2019-07-22 NOTE — Assessment & Plan Note (Signed)
Ok for otc nexium prn

## 2019-07-22 NOTE — Progress Notes (Signed)
Subjective:    Patient ID: Mason Morales, male    DOB: Feb 28, 1956, 64 y.o.   MRN: [id number]  HPI  Here to f/u; overall doing ok,  Pt denies chest pain, increasing sob or doe, wheezing, orthopnea, PND, increased LE swelling, palpitations, dizziness or syncope.  Pt denies new neurological symptoms such as new headache, or facial or extremity weakness or numbness.  Pt denies polydipsia, polyuria, or low sugar episode.  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  Not yet received unemployment checks dating to sept 2020, finances difficult.  Linzess too expensive Past Medical History:  Diagnosis Date  . Allergic rhinitis, cause unspecified 10/26/2010  . Allergy   . DDD (degenerative disc disease), cervical 10/26/2010  . DDD (degenerative disc disease), lumbar 10/26/2010  . Depression   . Generalized headaches   . GERD (gastroesophageal reflux disease)   . HTN (hypertension) 10/26/2010  . Hypertension   . Kidney stones    Past Surgical History:  Procedure Laterality Date  . MOUTH SURGERY  at 64 yo after horse accident    reports that he quit smoking about 5 years ago. He has never used smokeless tobacco. He reports current alcohol use of about 6.0 standard drinks of alcohol per week. He reports current drug use. Drug: Marijuana. family history includes Alcohol abuse in an other family member; Cancer in some other family members; Diabetes in some other family members; Heart disease in an other family member; Hyperlipidemia in an other family member; Hypertension in an other family member; Stroke in some other family members. Allergies  Allergen Reactions  . Isovue [Iopamidol] Hives and Itching  . Sulfa Antibiotics Nausea And Vomiting   Current  Outpatient Medications on File Prior to Visit  Medication Sig Dispense Refill  . acetaminophen (TYLENOL) 325 MG tablet Take 2 tablets (650 mg total) by mouth every 6 (six) hours as needed. Do not take more than 4000mg  of tylenol per day 30 tablet 0  . aspirin 81 MG EC tablet Take 1 tablet (81 mg total) by mouth daily. Swallow whole. 30 tablet 12  . citalopram (CELEXA) 20 MG tablet Take 1 tablet (20 mg total) by mouth daily. 90 tablet 0  . dicyclomine (BENTYL) 20 MG tablet Take 1 tablet (20 mg total) by mouth 2 (two) times daily. 20 tablet 3  . meloxicam (MOBIC) 15 MG tablet Take 1 tablet (15 mg total) by mouth daily as needed for pain. 90 tablet 3  . omeprazole (PRILOSEC) 20 MG capsule Take 1 capsule (20 mg total) by mouth daily as needed (for heartburn). 30 capsule 4  . Study - HPTN 083 - cabotegravir 600 mg/3 mL intramuscular injection or placebo Inject 3 mLs (600 mg total) into the muscle every 8 (eight) weeks. 0 mL 0  . Study - HPTN 083 - emtricitabine/tenofovir disoproxil fumarate (TRUVADA) 200-300 mg or placebo tablet Take 1 tablet by mouth daily.     No current facility-administered medications on file prior to visit.   Review of Systems All otherwise neg per pt     Objective:   Physical Exam BP (!) 180/88   Pulse 60   Temp 98.1 F (36.7 C)   Ht 5\' 9"  (1.753  m)   Wt 169 lb (76.7 kg)   SpO2 100%   BMI 24.96 kg/m  VS noted,  Constitutional: Pt appears in NAD HENT: Head: NCAT.  Right Ear: External ear normal.  Left Ear: External ear normal.  Eyes: . Pupils are equal, round, and reactive to light. Conjunctivae and EOM are normal Nose: without d/c or deformity Neck: Neck supple. Gross normal ROM Cardiovascular: Normal rate and regular rhythm.   Pulmonary/Chest: Effort normal and breath sounds without rales or wheezing.  Abd:  Soft, NT, ND, + BS, no organomegaly Neurological: Pt is alert. At baseline orientation, motor grossly intact Skin: Skin is warm. No rashes, other new  lesions, no LE edema Psychiatric: Pt behavior is normal without agitation  All otherwise neg per pt Lab Results  Component Value Date   WBC 5.0 06/25/2019   HGB 16.6 06/25/2019   HCT 47.7 06/25/2019   PLT 224 06/25/2019   GLUCOSE 105 (H) 06/25/2019   CHOL 161 11/13/2018   TRIG 62 11/13/2018   HDL 54 11/13/2018   LDLDIRECT 89.0 12/30/2015   LDLCALC 93 11/13/2018   ALT 12 06/25/2019   AST 18 06/25/2019   NA 140 06/25/2019   K 4.6 06/25/2019   CL 104 06/25/2019   CREATININE 1.21 06/25/2019   BUN 17 06/25/2019   CO2 30 06/25/2019   TSH 0.88 04/02/2018   PSA 0.50 04/02/2018         Assessment & Plan:

## 2019-07-22 NOTE — Telephone Encounter (Signed)
Unfortunately due to office refill policy, this refill would be more than 15 mo after last seen dec 2019  Please consider ROV for refills

## 2019-07-22 NOTE — Assessment & Plan Note (Addendum)
To restart amlodipine  I spent 18 minutes in preparing to see the patient by review of recent labs, imaging and procedures, obtaining and reviewing separately obtained history, communicating with the patient and family or caregiver, ordering medications, tests or procedures, and documenting clinical information in the EHR including the differential Dx, treatment, and any further evaluation and other management of htn, constipation, gerd

## 2019-08-20 ENCOUNTER — Other Ambulatory Visit: Payer: Self-pay

## 2019-08-20 ENCOUNTER — Encounter (INDEPENDENT_AMBULATORY_CARE_PROVIDER_SITE_OTHER): Payer: Self-pay | Admitting: *Deleted

## 2019-08-20 VITALS — BP 148/85 | HR 66 | Temp 97.9°F | Wt 161.4 lb

## 2019-08-20 DIAGNOSIS — Z006 Encounter for examination for normal comparison and control in clinical research program: Secondary | ICD-10-CM

## 2019-08-20 NOTE — Research (Signed)
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 No new complaints or medications. Verbalized excellent adherence with his open-label truvada. States that he may have missed 1-2 doses since his last visit. Has been using his pill box to help with daily dosing. 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 He is interested in switching to the injectable cabotegravir once V4.0 of the protocol is available. He will return in June for his next study visit.

## 2019-08-21 LAB — CBC WITH DIFFERENTIAL/PLATELET
Absolute Monocytes: 260 cells/uL (ref 200–950)
Basophils Absolute: 31 cells/uL (ref 0–200)
Basophils Relative: 0.7 %
Eosinophils Absolute: 48 cells/uL (ref 15–500)
Eosinophils Relative: 1.1 %
HCT: 44.2 % (ref 38.5–50.0)
Hemoglobin: 14.8 g/dL (ref 13.2–17.1)
Lymphs Abs: 2680 cells/uL (ref 850–3900)
MCH: 30.6 pg (ref 27.0–33.0)
MCHC: 33.5 g/dL (ref 32.0–36.0)
MCV: 91.3 fL (ref 80.0–100.0)
MPV: 9.8 fL (ref 7.5–12.5)
Monocytes Relative: 5.9 %
Neutro Abs: 1382 cells/uL — ABNORMAL LOW (ref 1500–7800)
Neutrophils Relative %: 31.4 %
Platelets: 198 10*3/uL (ref 140–400)
RBC: 4.84 10*6/uL (ref 4.20–5.80)
RDW: 13 % (ref 11.0–15.0)
Total Lymphocyte: 60.9 %
WBC: 4.4 10*3/uL (ref 3.8–10.8)

## 2019-08-21 LAB — COMPREHENSIVE METABOLIC PANEL
AG Ratio: 1.5 (calc) (ref 1.0–2.5)
ALT: 9 U/L (ref 9–46)
AST: 15 U/L (ref 10–35)
Albumin: 4.1 g/dL (ref 3.6–5.1)
Alkaline phosphatase (APISO): 74 U/L (ref 35–144)
BUN/Creatinine Ratio: 11 (calc) (ref 6–22)
BUN: 15 mg/dL (ref 7–25)
CO2: 26 mmol/L (ref 20–32)
Calcium: 8.9 mg/dL (ref 8.6–10.3)
Chloride: 107 mmol/L (ref 98–110)
Creat: 1.37 mg/dL — ABNORMAL HIGH (ref 0.70–1.25)
Globulin: 2.8 g/dL (calc) (ref 1.9–3.7)
Glucose, Bld: 102 mg/dL — ABNORMAL HIGH (ref 65–99)
Potassium: 3.9 mmol/L (ref 3.5–5.3)
Sodium: 138 mmol/L (ref 135–146)
Total Bilirubin: 0.4 mg/dL (ref 0.2–1.2)
Total Protein: 6.9 g/dL (ref 6.1–8.1)

## 2019-08-21 LAB — LIPASE: Lipase: 54 U/L (ref 7–60)

## 2019-08-21 LAB — HIV ANTIBODY (ROUTINE TESTING W REFLEX): HIV 1&2 Ab, 4th Generation: NONREACTIVE

## 2019-08-21 LAB — CK: Total CK: 91 U/L (ref 44–196)

## 2019-08-21 LAB — PHOSPHORUS: Phosphorus: 2.1 mg/dL — ABNORMAL LOW (ref 2.5–4.5)

## 2019-08-21 LAB — AMYLASE: Amylase: 71 U/L (ref 21–101)

## 2019-08-27 ENCOUNTER — Other Ambulatory Visit: Payer: Self-pay | Admitting: Internal Medicine

## 2019-08-28 ENCOUNTER — Encounter (INDEPENDENT_AMBULATORY_CARE_PROVIDER_SITE_OTHER): Payer: Self-pay | Admitting: *Deleted

## 2019-08-28 ENCOUNTER — Other Ambulatory Visit: Payer: Self-pay

## 2019-08-28 VITALS — Wt 167.1 lb

## 2019-08-28 DIAGNOSIS — Z006 Encounter for examination for normal comparison and control in clinical research program: Secondary | ICD-10-CM

## 2019-08-28 NOTE — Research (Signed)
Mason Morales is here for repeat labs after having a decrease creatinine clearance (58.63 ml/min, grade 3) at his week 145 visit last week. He says that he has been trying to hydrate and has avoided any alcohol intake. Currently on open-label truvada with excellent adherence. His next study visit is scheduled for 10/15/19.

## 2019-08-29 LAB — BASIC METABOLIC PANEL
BUN: 18 mg/dL (ref 7–25)
CO2: 25 mmol/L (ref 20–32)
Calcium: 9.2 mg/dL (ref 8.6–10.3)
Chloride: 105 mmol/L (ref 98–110)
Creat: 1.2 mg/dL (ref 0.70–1.25)
Glucose, Bld: 87 mg/dL (ref 65–99)
Potassium: 4.1 mmol/L (ref 3.5–5.3)
Sodium: 138 mmol/L (ref 135–146)

## 2019-10-15 ENCOUNTER — Encounter: Payer: Self-pay | Admitting: *Deleted

## 2019-10-16 ENCOUNTER — Encounter (INDEPENDENT_AMBULATORY_CARE_PROVIDER_SITE_OTHER): Payer: Self-pay | Admitting: *Deleted

## 2019-10-16 ENCOUNTER — Other Ambulatory Visit: Payer: Self-pay

## 2019-10-16 VITALS — BP 135/75 | HR 63 | Temp 97.7°F | Wt 157.2 lb

## 2019-10-16 DIAGNOSIS — Z006 Encounter for examination for normal comparison and control in clinical research program: Secondary | ICD-10-CM

## 2019-10-16 MED ORDER — STUDY - HPTN 083 (STEP 4, OPEN-LABEL) - CABOTEGRAVIR 600MG/3ML INJECTION (PI-VAN DAM): 600.0000 mg | INJECTION | INTRAMUSCULAR | Status: DC

## 2019-10-16 NOTE — Research (Signed)
Mason Morales is here for his HPTN study visit. Informed consent for version 4.0 of the protocol was reviewed/explained. Risk, benefits, responsibilities and options were discussed. He verbalized understanding and signed the informed consent witnessed by me. Stated that he wanted to switch to cabotegravir and go directly to injection. We reviewed possible adverse effects from study product. Rapid HIV was non-reactive. Cabotegravir injection given (R) glute without problem. He will return in July for his next study visit and injection.

## 2019-10-18 LAB — HIV-1 RNA QUANT-NO REFLEX-BLD
HIV 1 RNA Quant: <20 copies/mL
HIV-1 RNA Quant, Log: <1.3 Log copies/mL

## 2019-10-18 LAB — HEPATIC FUNCTION PANEL
AG Ratio: 1.6 (calc) (ref 1.0–2.5)
ALT: 9 U/L (ref 9–46)
AST: 17 U/L (ref 10–35)
Albumin: 4.1 g/dL (ref 3.6–5.1)
Alkaline phosphatase (APISO): 70 U/L (ref 35–144)
Bilirubin, Direct: 0.1 mg/dL (ref 0.0–0.2)
Globulin: 2.5 g/dL (calc) (ref 1.9–3.7)
Indirect Bilirubin: 0.1 mg/dL (calc) — ABNORMAL LOW (ref 0.2–1.2)
Total Bilirubin: 0.2 mg/dL (ref 0.2–1.2)
Total Protein: 6.6 g/dL (ref 6.1–8.1)

## 2019-10-18 LAB — HIV ANTIBODY (ROUTINE TESTING W REFLEX): HIV 1&2 Ab, 4th Generation: NONREACTIVE

## 2019-10-18 LAB — CREATININE, SERUM: Creat: 1.51 mg/dL — ABNORMAL HIGH (ref 0.70–1.25)

## 2019-10-28 ENCOUNTER — Encounter (INDEPENDENT_AMBULATORY_CARE_PROVIDER_SITE_OTHER): Payer: Self-pay | Admitting: *Deleted

## 2019-10-28 ENCOUNTER — Other Ambulatory Visit: Payer: Self-pay

## 2019-10-28 VITALS — BP 159/90 | HR 65 | Temp 98.0°F | Wt 154.4 lb

## 2019-10-28 DIAGNOSIS — Z006 Encounter for examination for normal comparison and control in clinical research program: Secondary | ICD-10-CM

## 2019-10-28 LAB — CREATININE, SERUM: Creat: 1.09 mg/dL (ref 0.70–1.25)

## 2019-10-28 NOTE — Research (Signed)
Mason Morales is here for repeat blood work after having an increase creatinine and decrease CrCL at his study visit last week. States that he has been hydrating well each day. He switched to the cabotegravir injections last week, per version 4.0 of the protocol. States that he has been eating better and has a better appetite. He is down 3lbs since last week, total of 13lbs since May. Feels he has much more energy and has been much more active in the last week. Will continue to monitor his weight. Next study visit scheduled for 7/19.

## 2019-11-09 ENCOUNTER — Other Ambulatory Visit: Payer: Self-pay

## 2019-11-09 ENCOUNTER — Encounter (INDEPENDENT_AMBULATORY_CARE_PROVIDER_SITE_OTHER): Payer: Self-pay | Admitting: *Deleted

## 2019-11-09 VITALS — BP 152/88 | HR 67 | Temp 98.0°F | Wt 154.0 lb

## 2019-11-09 DIAGNOSIS — Z006 Encounter for examination for normal comparison and control in clinical research program: Secondary | ICD-10-CM

## 2019-11-09 NOTE — Research (Signed)
Ivin is here for his Step 4C week 0 HPTN 083 visit. No new complaints or medications. Denied any injection site reaction after his last injection. 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 Cabotegravir given (R) glute without problem. He will return in September for his next study visit and injection.

## 2019-11-12 LAB — HEPATIC FUNCTION PANEL
AG Ratio: 1.4 (calc) (ref 1.0–2.5)
ALT: 9 U/L (ref 9–46)
AST: 17 U/L (ref 10–35)
Albumin: 4.4 g/dL (ref 3.6–5.1)
Alkaline phosphatase (APISO): 72 U/L (ref 35–144)
Bilirubin, Direct: 0.1 mg/dL (ref 0.0–0.2)
Globulin: 3.2 g/dL (calc) (ref 1.9–3.7)
Indirect Bilirubin: 0.3 mg/dL (calc) (ref 0.2–1.2)
Total Bilirubin: 0.4 mg/dL (ref 0.2–1.2)
Total Protein: 7.6 g/dL (ref 6.1–8.1)

## 2019-11-12 LAB — HIV ANTIBODY (ROUTINE TESTING W REFLEX): HIV 1&2 Ab, 4th Generation: NONREACTIVE

## 2019-11-12 LAB — HIV-1 RNA QUANT-NO REFLEX-BLD
HIV 1 RNA Quant: <20 copies/mL
HIV-1 RNA Quant, Log: <1.3 Log copies/mL

## 2019-11-12 LAB — CREATININE, SERUM: Creat: 1.19 mg/dL (ref 0.70–1.25)

## 2020-01-06 ENCOUNTER — Other Ambulatory Visit: Payer: Self-pay

## 2020-01-06 ENCOUNTER — Encounter (INDEPENDENT_AMBULATORY_CARE_PROVIDER_SITE_OTHER): Payer: Self-pay | Admitting: *Deleted

## 2020-01-06 VITALS — BP 174/89 | HR 60 | Temp 97.8°F | Wt 155.5 lb

## 2020-01-06 DIAGNOSIS — Z006 Encounter for examination for normal comparison and control in clinical research program: Secondary | ICD-10-CM

## 2020-01-06 NOTE — Research (Signed)
Mason Morales is here for his Step 4C week 8 HPTN 083 study visit. Denied any site reaction after his last injection. He has received both doses of the Pfizer Covid-19 vaccine on 11/10/19 and 12/30/19. BP elevated today. States that he has been out of his BP medication for 2 days. He is going by the pharmacy today after his visit to pick up his medication. Denied any headaches, dizziness or visual changes. Rapid HIV was non-reactive today. Cabotegravir injection given (R) glute without problem. He will return in November for his next study visit.

## 2020-01-10 LAB — HIV-1 RNA QUANT-NO REFLEX-BLD
HIV 1 RNA Quant: <20 Copies/mL
HIV-1 RNA Quant, Log: <1.3 Log cps/mL

## 2020-01-10 LAB — HIV ANTIBODY (ROUTINE TESTING W REFLEX): HIV 1&2 Ab, 4th Generation: NONREACTIVE

## 2020-03-01 ENCOUNTER — Encounter (INDEPENDENT_AMBULATORY_CARE_PROVIDER_SITE_OTHER): Payer: Self-pay | Admitting: *Deleted

## 2020-03-01 ENCOUNTER — Other Ambulatory Visit: Payer: Self-pay

## 2020-03-01 VITALS — BP 168/87 | HR 60 | Temp 97.6°F | Wt 160.4 lb

## 2020-03-01 DIAGNOSIS — Z006 Encounter for examination for normal comparison and control in clinical research program: Secondary | ICD-10-CM

## 2020-03-01 NOTE — Research (Signed)
Mason Morales was here for his week 16 visit for step 4C of HPTN 083. He said he had started having some panic attacks in the middle of the night this past week and said he would then take a celexa and it would go away. He is supposed to be taking the celexa every day so we discussed him resuming the med daily and seeing if that helps, If not then he was directed to see his PCP.  He denies any ISR from the last visit or any other new meds or problems. He was given an injection of cabotegravir in his rt butock without problem and will be returning in 8 weeks.

## 2020-03-07 LAB — HIV-1 RNA QUANT-NO REFLEX-BLD
HIV 1 RNA Quant: <20 Copies/mL
HIV-1 RNA Quant, Log: <1.3 Log cps/mL

## 2020-03-07 LAB — HIV ANTIBODY (ROUTINE TESTING W REFLEX): HIV 1&2 Ab, 4th Generation: NONREACTIVE

## 2020-04-26 ENCOUNTER — Encounter (INDEPENDENT_AMBULATORY_CARE_PROVIDER_SITE_OTHER): Payer: Self-pay | Admitting: *Deleted

## 2020-04-26 ENCOUNTER — Other Ambulatory Visit: Payer: Self-pay

## 2020-04-26 VITALS — BP 164/83 | HR 69 | Temp 97.5°F | Wt 157.4 lb

## 2020-04-26 DIAGNOSIS — Z006 Encounter for examination for normal comparison and control in clinical research program: Secondary | ICD-10-CM

## 2020-04-26 NOTE — Research (Signed)
Mason Morales here today for his Step 4C week 24 HPTN 083 study visit. BP elevated. Stated that he had not taken his BP medication this morning but plans to take it as soon as he gets home. No complaints verbalized. Seen by orthopedist 2 weeks ago for neck pain and new onset numbness to (L) arm. Given steroid dose pack and states symptoms have resolved. Denied any problems after his last injection. 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 Cabotegravir injection given (R) glute without problem. He will return in February for his next study visit.

## 2020-04-27 LAB — C. TRACHOMATIS/N. GONORRHOEAE RNA
C. trachomatis RNA, TMA: NOT DETECTED
N. gonorrhoeae RNA, TMA: NOT DETECTED

## 2020-04-27 LAB — CT/NG RNA, TMA RECTAL
Chlamydia Trachomatis RNA: NOT DETECTED
Neisseria Gonorrhoeae RNA: NOT DETECTED

## 2020-05-04 LAB — HEPATIC FUNCTION PANEL
AG Ratio: 1.6 (calc) (ref 1.0–2.5)
ALT: 9 U/L (ref 9–46)
AST: 13 U/L (ref 10–35)
Albumin: 4.5 g/dL (ref 3.6–5.1)
Alkaline phosphatase (APISO): 71 U/L (ref 35–144)
Bilirubin, Direct: 0.1 mg/dL (ref 0.0–0.2)
Globulin: 2.9 g/dL (calc) (ref 1.9–3.7)
Indirect Bilirubin: 0.4 mg/dL (calc) (ref 0.2–1.2)
Total Bilirubin: 0.5 mg/dL (ref 0.2–1.2)
Total Protein: 7.4 g/dL (ref 6.1–8.1)

## 2020-05-04 LAB — FLUORESCENT TREPONEMAL AB(FTA)-IGG-BLD: Fluorescent Treponemal ABS: REACTIVE — AB

## 2020-05-04 LAB — HIV-1 RNA QUANT-NO REFLEX-BLD
HIV 1 RNA Quant: <20 {copies}/mL
HIV-1 RNA Quant, Log: <1.3 {Log_copies}/mL

## 2020-05-04 LAB — RPR: RPR Ser Ql: REACTIVE — AB

## 2020-05-04 LAB — CREATININE, SERUM: Creat: 1.27 mg/dL — ABNORMAL HIGH (ref 0.70–1.25)

## 2020-05-04 LAB — RPR TITER: RPR Titer: 1:2 {titer} — ABNORMAL HIGH

## 2020-05-04 LAB — HIV ANTIBODY (ROUTINE TESTING W REFLEX): HIV 1&2 Ab, 4th Generation: NONREACTIVE

## 2020-06-20 ENCOUNTER — Encounter (INDEPENDENT_AMBULATORY_CARE_PROVIDER_SITE_OTHER): Payer: Self-pay | Admitting: *Deleted

## 2020-06-20 ENCOUNTER — Other Ambulatory Visit: Payer: Self-pay

## 2020-06-20 ENCOUNTER — Other Ambulatory Visit: Payer: Self-pay | Admitting: *Deleted

## 2020-06-20 VITALS — BP 157/94 | HR 73 | Temp 97.5°F | Wt 167.5 lb

## 2020-06-20 DIAGNOSIS — Z006 Encounter for examination for normal comparison and control in clinical research program: Secondary | ICD-10-CM

## 2020-06-20 NOTE — Research (Signed)
Mason Morales here today for his Step 4C Week 32 HPTN 083 study visit. Denied any injection site reactions after his last visit. He did receive steroid injections to his neck earlier this month for his cervical DDD. States good results with the injections. Rapid HIV was non-reactive today. Cabotegravir injection given (R) glute without problem. He will return in April for his next study visit

## 2020-06-22 LAB — HIV-1 RNA QUANT-NO REFLEX-BLD
HIV 1 RNA Quant: NOT DETECTED Copies/mL
HIV-1 RNA Quant, Log: NOT DETECTED Log cps/mL

## 2020-06-22 LAB — HIV ANTIBODY (ROUTINE TESTING W REFLEX): HIV 1&2 Ab, 4th Generation: NONREACTIVE

## 2020-07-05 ENCOUNTER — Telehealth: Payer: Self-pay | Admitting: *Deleted

## 2020-07-05 NOTE — Telephone Encounter (Signed)
Received call from patient. Complaining of lower abdominal and scrotal pain for about 5 days. State that he has also noted blood in his urine. Denied dysuria. Requesting STI testing. Instructed to contact his PCP for an appointment and evaluation. Agreed.

## 2020-07-11 ENCOUNTER — Ambulatory Visit: Payer: Self-pay | Admitting: Internal Medicine

## 2020-08-03 ENCOUNTER — Ambulatory Visit: Payer: Self-pay | Admitting: Internal Medicine

## 2020-08-03 DIAGNOSIS — Z0289 Encounter for other administrative examinations: Secondary | ICD-10-CM

## 2020-08-18 ENCOUNTER — Encounter (INDEPENDENT_AMBULATORY_CARE_PROVIDER_SITE_OTHER): Payer: Self-pay | Admitting: *Deleted

## 2020-08-18 ENCOUNTER — Other Ambulatory Visit: Payer: Self-pay

## 2020-08-18 VITALS — BP 171/89 | HR 63 | Temp 97.9°F | Wt 164.0 lb

## 2020-08-18 DIAGNOSIS — Z006 Encounter for examination for normal comparison and control in clinical research program: Secondary | ICD-10-CM
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
## 2020-08-22 LAB — HIV ANTIBODY (ROUTINE TESTING W REFLEX): HIV 1&2 Ab, 4th Generation: NONREACTIVE

## 2020-08-22 LAB — HIV-1 RNA QUANT-NO REFLEX-BLD
HIV 1 RNA Quant: NOT DETECTED Copies/mL
HIV-1 RNA Quant, Log: NOT DETECTED Log cps/mL

## 2020-10-03 ENCOUNTER — Telehealth: Payer: Self-pay | Admitting: Internal Medicine

## 2020-10-03 NOTE — Telephone Encounter (Signed)
Called patient to schedule an appointment for the team health called. Voicemail was left.

## 2020-10-03 NOTE — Telephone Encounter (Signed)
[facility] FYI- 6.11.22  ---Caller states he has symptoms of pink eye. Reports lots of discharge, sclera is very red in the right eye.Symptoms started yesterday.  Advised to see PCP within 4 hours

## 2020-10-11 ENCOUNTER — Encounter (INDEPENDENT_AMBULATORY_CARE_PROVIDER_SITE_OTHER): Payer: Self-pay | Admitting: *Deleted

## 2020-10-11 ENCOUNTER — Other Ambulatory Visit: Payer: Self-pay

## 2020-10-11 VITALS — BP 159/87 | HR 72 | Temp 98.4°F | Wt 158.4 lb

## 2020-10-11 DIAGNOSIS — Z006 Encounter for examination for normal comparison and control in clinical research program: Secondary | ICD-10-CM

## 2020-10-11 NOTE — Research (Signed)
Mason Morales here today for his Step 4C week 48 HPTN 083 study visit. No new complaints or medications. Denied any injection site reaction after his last injection. Rapid HIV was non-reactive today. He received cabotegravir injection (R) goute without problem. He will return in August for his next study visit.

## 2020-10-12 ENCOUNTER — Other Ambulatory Visit (HOSPITAL_COMMUNITY): Payer: Self-pay

## 2020-10-12 LAB — CT/NG RNA, TMA RECTAL
Chlamydia Trachomatis RNA: NOT DETECTED
Neisseria Gonorrhoeae RNA: NOT DETECTED

## 2020-10-12 LAB — C. TRACHOMATIS/N. GONORRHOEAE RNA
C. trachomatis RNA, TMA: NOT DETECTED
N. gonorrhoeae RNA, TMA: NOT DETECTED

## 2020-10-14 LAB — HIV ANTIBODY (ROUTINE TESTING W REFLEX): HIV 1&2 Ab, 4th Generation: NONREACTIVE

## 2020-10-14 LAB — HEPATIC FUNCTION PANEL
AG Ratio: 1.5 (calc) (ref 1.0–2.5)
ALT: 8 U/L — ABNORMAL LOW (ref 9–46)
AST: 16 U/L (ref 10–35)
Albumin: 4.1 g/dL (ref 3.6–5.1)
Alkaline phosphatase (APISO): 69 U/L (ref 35–144)
Bilirubin, Direct: 0.1 mg/dL (ref 0.0–0.2)
Globulin: 2.8 g/dL (calc) (ref 1.9–3.7)
Indirect Bilirubin: 0.2 mg/dL (calc) (ref 0.2–1.2)
Total Bilirubin: 0.3 mg/dL (ref 0.2–1.2)
Total Protein: 6.9 g/dL (ref 6.1–8.1)

## 2020-10-14 LAB — RPR: RPR Ser Ql: NONREACTIVE

## 2020-10-14 LAB — HEPATITIS C ANTIBODY
Hepatitis C Ab: NONREACTIVE
SIGNAL TO CUT-OFF: 0.01 (ref <1.00)

## 2020-10-14 LAB — HIV-1 RNA QUANT-NO REFLEX-BLD
HIV 1 RNA Quant: NOT DETECTED Copies/mL
HIV-1 RNA Quant, Log: NOT DETECTED Log cps/mL

## 2020-10-14 LAB — CREATININE, SERUM: Creat: 1.1 mg/dL (ref 0.70–1.25)

## 2020-10-22 ENCOUNTER — Encounter (HOSPITAL_COMMUNITY): Payer: Self-pay | Admitting: *Deleted

## 2020-10-22 ENCOUNTER — Other Ambulatory Visit: Payer: Self-pay

## 2020-10-22 ENCOUNTER — Emergency Department (HOSPITAL_COMMUNITY)
Admission: EM | Admit: 2020-10-22 | Discharge: 2020-10-22 | Disposition: A | Discharge status: Home / Self Care | Payer: Self-pay | Attending: Emergency Medicine | Admitting: Emergency Medicine

## 2020-10-22 ENCOUNTER — Emergency Department (HOSPITAL_COMMUNITY): Payer: Self-pay

## 2020-10-22 DIAGNOSIS — N179 Acute kidney failure, unspecified: Secondary | ICD-10-CM | POA: Insufficient documentation

## 2020-10-22 DIAGNOSIS — F1290 Cannabis use, unspecified, uncomplicated: Secondary | ICD-10-CM | POA: Insufficient documentation

## 2020-10-22 DIAGNOSIS — I10 Essential (primary) hypertension: Secondary | ICD-10-CM | POA: Insufficient documentation

## 2020-10-22 DIAGNOSIS — Z79899 Other long term (current) drug therapy: Secondary | ICD-10-CM | POA: Insufficient documentation

## 2020-10-22 DIAGNOSIS — F419 Anxiety disorder, unspecified: Secondary | ICD-10-CM | POA: Insufficient documentation

## 2020-10-22 DIAGNOSIS — Z87891 Personal history of nicotine dependence: Secondary | ICD-10-CM | POA: Insufficient documentation

## 2020-10-22 DIAGNOSIS — Z7982 Long term (current) use of aspirin: Secondary | ICD-10-CM | POA: Insufficient documentation

## 2020-10-22 HISTORY — DX: Panic disorder (episodic paroxysmal anxiety): F41.0

## 2020-10-22 LAB — BASIC METABOLIC PANEL
Anion gap: 7 (ref 5–15)
BUN: 12 mg/dL (ref 8–23)
CO2: 25 mmol/L (ref 22–32)
Calcium: 8.9 mg/dL (ref 8.9–10.3)
Chloride: 105 mmol/L (ref 98–111)
Creatinine, Ser: 1.28 mg/dL — ABNORMAL HIGH (ref 0.61–1.24)
GFR, Estimated: >60 mL/min (ref >60)
Glucose, Bld: 108 mg/dL — ABNORMAL HIGH (ref 70–99)
Potassium: 3.8 mmol/L (ref 3.5–5.1)
Sodium: 137 mmol/L (ref 135–145)

## 2020-10-22 LAB — TROPONIN I (HIGH SENSITIVITY)
Troponin I (High Sensitivity): 8 ng/L (ref <18)
Troponin I (High Sensitivity): 7 ng/L (ref <18)

## 2020-10-22 LAB — CBC
HCT: 37.8 % — ABNORMAL LOW (ref 39.0–52.0)
Hemoglobin: 12.5 g/dL — ABNORMAL LOW (ref 13.0–17.0)
MCH: 30.1 pg (ref 26.0–34.0)
MCHC: 33.1 g/dL (ref 30.0–36.0)
MCV: 91.1 fL (ref 80.0–100.0)
Platelets: 221 10*3/uL (ref 150–400)
RBC: 4.15 MIL/uL — ABNORMAL LOW (ref 4.22–5.81)
RDW: 13 % (ref 11.5–15.5)
WBC: 5.1 10*3/uL (ref 4.0–10.5)
nRBC: 0 % (ref 0.0–0.2)

## 2020-10-22 NOTE — ED Triage Notes (Signed)
The pt awakened 2 hours ago with sob and anxious  hx of panic attacks and he is on meds for the same

## 2020-10-22 NOTE — ED Provider Notes (Signed)
[facility] EM[facility] Provider Note   CSN: [id number] Arrival date & time: 10/22/20  0446     History Chief Complaint  Patient presents with   Shortness of Breath    Mason Morales is a 65 y.o. male with a history of hypertension, HIV, panic attack, GERD.  Patient presents emergency department with a chief complaint of anxiety and panic attack.  Patient reports that he has not slept in the last 3 days.  His anxiety and frequent panic attacks.  Patient reports that this morning when he closes eyes to go to sleep he became nervous and had a panic attack.  Patient started hyperventilating and had tingling sensation in bilateral hands.  Patient reports that this is consistent with previous panic attacks he has had in the past.  Patient denies any chest pain or discomfort associated with this panic attack.  Patient states that he does take medication regularly for anxiety.  This medication is prescribed by his primary care provider.  Patient does not see a therapist or psychiatrist.  Patient denies any chest pain, URI symptoms, hemoptysis, leg swelling, palpitations, abdominal pain, vomiting, blood in stool, melena, hematuria, dysuria, syncope, headaches, numbness, weakness, facial asymmetry, slurred speech, suicidal ideations, homicidal ideations, auditory hallucinations, visual hallucinations.  Patient endorses marijuana use.  Patient endorses occasional alcohol use.  Patient denies any tobacco use.     Shortness of Breath Associated symptoms: no abdominal pain, no chest pain, no cough, no fever, no headaches, no neck pain, no rash, no sore throat and no vomiting       Past Medical History:  Diagnosis Date   Allergic rhinitis, cause unspecified 10/26/2010   Allergy    DDD (degenerative disc disease), cervical 10/26/2010   DDD (degenerative disc disease), lumbar 10/26/2010   Depression    Generalized headaches    GERD (gastroesophageal reflux disease)     HTN (hypertension) 10/26/2010   Hypertension    Kidney stones    Panic attack     Patient Active Problem List   Diagnosis Date Noted   Chronic constipation 07/22/2019   Left lumbar radiculopathy 03/26/2017   Acute sinus infection 12/14/2016   Pelvic pain 08/24/2016   Left cervical radiculopathy 05/20/2015   Anxiety state 05/20/2015   GERD (gastroesophageal reflux disease) 10/26/2010   Allergic rhinitis 10/26/2010   HTN (hypertension) 10/26/2010   Kidney stones 10/26/2010   DDD (degenerative disc disease), cervical 10/26/2010   DDD (degenerative disc disease), lumbar 10/26/2010   Left shoulder pain 10/26/2010   Preventative health care 10/26/2010    Past Surgical History:  Procedure Laterality Date   MOUTH SURGERY  at 65 yo after horse accident       Family History  Problem Relation Age of Onset   Hyperlipidemia Other    Heart disease Other    Stroke Other    Hypertension Other    Diabetes Other    Cancer Other        prostate cancer   Diabetes Other    Alcohol abuse Other    Cancer Other        breast cancer   Stroke Other    Colon cancer Neg Hx    Esophageal cancer Neg Hx    Pancreatic cancer Neg Hx    Rectal cancer Neg Hx    Stomach cancer Neg Hx     Social History   Tobacco Use   Smoking status: Former    Pack years: 0.00    Types:  Cigarettes    Quit date: 03/20/2014    Years since quitting: 6.5   Smokeless tobacco: Never  Vaping Use   Vaping Use: Never used  Substance Use Topics   Alcohol use: Yes    Alcohol/week: 6.0 standard drinks    Types: 6 Cans of beer per week    Comment: occ   Drug use: Yes    Types: Marijuana    Comment: THC-daily    Home Medications Prior to Admission medications   Medication Sig Start Date End Date Taking? Authorizing Provider  acetaminophen (TYLENOL) 325 MG tablet Take 2 tablets (650 mg total) by mouth every 6 (six) hours as needed. Do not take more than 4000mg  of tylenol per day 08/07/17   Couture, Cortni  S, PA-C  amLODipine (NORVASC) 10 MG tablet Take 1 tablet (10 mg total) by mouth daily. 07/22/19   07/24/19, MD  aspirin 81 MG EC tablet Take 1 tablet (81 mg total) by mouth daily. Swallow whole. 03/18/12   03/20/12, MD  citalopram (CELEXA) 20 MG tablet TAKE ONE TABLET BY MOUTH DAILY 08/27/19   10/27/19, MD  dicyclomine (BENTYL) 20 MG tablet Take 1 tablet (20 mg total) by mouth 2 (two) times daily. 04/02/18   14/11/19, MD  Lactulose 20 GM/30ML SOLN 30 ml by mouth up to three times daily 07/22/19   07/24/19, MD  meloxicam (MOBIC) 15 MG tablet Take 1 tablet (15 mg total) by mouth daily as needed for pain. 04/02/18   14/11/19, MD  omeprazole (PRILOSEC) 20 MG capsule Take 1 capsule (20 mg total) by mouth daily as needed (for heartburn). 08/16/16   08/18/16, MD  Study - HPTN [phone] (Step 4, open-label) - cabotegravir 600 mg/3 mL intramuscular injection ([place]) Inject 3 mLs (600 mg total) into the muscle every 8 (eight) weeks. 10/16/19   10/18/19, MD    Allergies    Isovue [iopamidol] and Sulfa antibiotics  Review of Systems   Review of Systems  Constitutional:  Negative for chills and fever.  HENT:  Negative for congestion, rhinorrhea and sore throat.   Eyes:  Negative for visual disturbance.  Respiratory:  Positive for shortness of breath. Negative for cough.   Cardiovascular:  Negative for chest pain, palpitations and leg swelling.  Gastrointestinal:  Positive for nausea. Negative for abdominal distention, abdominal pain, anal bleeding, blood in stool, constipation, diarrhea, rectal pain and vomiting.  Genitourinary:  Negative for difficulty urinating, dysuria, flank pain, frequency, hematuria, penile discharge, penile pain, penile swelling, scrotal swelling and testicular pain.  Musculoskeletal:  Negative for back pain and neck pain.  Skin:  Negative for color change and rash.  Neurological:  Negative for dizziness, tremors, seizures, syncope, facial  asymmetry, speech difficulty, weakness, light-headedness, numbness and headaches.  Psychiatric/Behavioral:  Positive for sleep disturbance. Negative for confusion, hallucinations and suicidal ideas.    Physical Exam Updated Vital Signs BP (!) 141/70 (BP Location: Right Arm)   Pulse (!) 58   Temp 97.9 F (36.6 C) (Oral)   Resp 18   Ht 5\' 9"  (1.753 m)   Wt 71.8 kg   SpO2 99%   BMI 23.38 kg/m   Physical Exam Vitals and nursing note reviewed.  Constitutional:      General: He is not in acute distress.    Appearance: He is not ill-appearing, toxic-appearing or diaphoretic.  HENT:     Head: Normocephalic.  Eyes:  General: No scleral icterus.       Right eye: No discharge.        Left eye: No discharge.  Cardiovascular:     Rate and Rhythm: Normal rate.     Pulses:          Radial pulses are 2+ on the right side and 2+ on the left side.     Heart sounds: Normal heart sounds, S1 normal and S2 normal. No murmur heard. Pulmonary:     Effort: Pulmonary effort is normal. No bradypnea or respiratory distress.     Breath sounds: Normal breath sounds. No stridor.  Abdominal:     General: There is no distension. There are no signs of injury.     Palpations: Abdomen is soft. There is no mass or pulsatile mass.     Tenderness: There is no abdominal tenderness. There is no guarding or rebound.     Hernia: There is no hernia in the umbilical area or ventral area.  Musculoskeletal:     Cervical back: Neck supple.     Right lower leg: No swelling, tenderness or bony tenderness. No edema.     Left lower leg: No swelling, tenderness or bony tenderness. No edema.  Skin:    General: Skin is warm and dry.  Neurological:     General: No focal deficit present.     Mental Status: He is alert.  Psychiatric:        Behavior: Behavior is cooperative.    ED Results / Procedures / Treatments   Labs (all labs ordered are listed, but only abnormal results are displayed) Labs Reviewed  BASIC  METABOLIC PANEL - Abnormal; Notable for the following components:      Result Value   Glucose, Bld 108 (*)    Creatinine, Ser 1.28 (*)    All other components within normal limits  CBC - Abnormal; Notable for the following components:   RBC 4.15 (*)    Hemoglobin 12.5 (*)    HCT 37.8 (*)    All other components within normal limits  TROPONIN I (HIGH SENSITIVITY)  TROPONIN I (HIGH SENSITIVITY)    EKG EKG Interpretation  Date/Time:  Saturday October 22 2020 05:03:33 EDT Ventricular Rate:  67 PR Interval:  188 QRS Duration: 80 QT Interval:  384 QTC Calculation: 405 R Axis:   89 Text Interpretation: Normal sinus rhythm T wave abnormality, consider inferior ischemia Abnormal ECG No significant change since last tracing Confirmed by Jacalyn Lefevre [phone]) on 10/22/2020 8:41:30 AM  Radiology DG Chest 2 View  Result Date: 10/22/2020 CLINICAL DATA:  65 year old male wakened by acute shortness of breath. EXAM: CHEST - 2 VIEW COMPARISON:  Chest radiographs 08/07/2017 and earlier. FINDINGS: PA and lateral views. Mildly lower lung volumes. Normal cardiac size and mediastinal contours. Visualized tracheal air column is within normal limits. Minor lung base atelectasis, elsewhere lungs remain clear. No pneumothorax or pleural effusion. No acute osseous abnormality identified. Negative visible bowel gas pattern. IMPRESSION: Lower lung volumes.  No acute cardiopulmonary abnormality. Electronically Signed   By: Odessa Fleming M.D.   On: 10/22/2020 06:13    Procedures Procedures   Medications Ordered in ED Medications - No data to display  ED Course  I have reviewed the triage vital signs and the nursing notes.  Pertinent labs & imaging results that were available during my care of the patient were reviewed by me and considered in my medical decision making (see chart for details).    MDM  Rules/Calculators/A&P                          Alert 65 year old male no distress, nontoxic-appearing.  Patient  presents emergency department with a chief complaint of anxiety and panic attacks.  Patient has been having frequent anxiety and panic attacks over the last 2 days.  Denies any change from previous panic attacks.  Patient has no associated chest pain or discomfort with these panic attacks.  ACS work-up was obtained while patient was in triage. EKG shows normal sinus rhythm. Chest x-ray shows no active cardiopulmonary disease Troponin 8 and 7 with delta of -1. Low suspicion for ACS at this time based on work-up and patient's HPI.  Suspicion for PE as patient is low risk based on [place] here for PE.  Patient has no swelling, edema, or tenderness to bilateral lower extremities.  CBC shows anemia with hemoglobin at 12.5 and hematocrit 37.8.  Patient has no reports of bleeding.  We will have patient follow-up with his primary care provider for repeat testing.  BMP shows creatinine elevated at 1.28.  Patient is currently in a drug trial for HIV receiving cabotegravir injections.  Patient last received injection 6/21.  Suspect that this is the cause of patient's increased creatinine.  We will consult infectious disease team.  08 48 spoke to on-call infectious disease provider Dr. Ninetta Lights who agrees that cabotegravir could be the cause of patient's AKI.  Agrees with plan to encourage hydration and close follow-up with primary care provider for repeat testing.  Patient is hemodynamically stable in no distress at this time.  Will discharge patient to have close follow-up with primary care provider for repeat lab testing.  Patient was given strict return precautions.  Patient expressed understanding of all instructions and is agreeable with this plan.  Final Clinical Impression(s) / ED Diagnoses Final diagnoses:  Anxiety  AKI (acute kidney injury) [facility])    Rx / DC Orders ED Discharge Orders     None        Berneice Heinrich 10/22/20 1746    Jacalyn Lefevre, MD 10/23/20 [phone]

## 2020-10-22 NOTE — ED Notes (Signed)
RN reviewed discharge instructions w/ pt. Follow up and outpatient resources reviewed, pt had no further questions.

## 2020-10-22 NOTE — Discharge Instructions (Addendum)
You came to the emergency department today to be evaluated for your anxiety and panic attacks.  Your physical exam was reassuring.  Your lab work showed that your kidney function was slightly elevated.  This is likely due to the cabotegravir injection you just received.  Please make sure to stay well-hydrated.  You will need to follow-up closely with your primary care provider to have this level rechecked.  Have given you resources to reach out to a counselor/therapist for your anxiety.  Please return to the emergency department immediately if you start having any suicidal thoughts, homicidal thoughts, auditory hallucinations, or visual hallucinations.  Get help right away if: You develop symptoms of worsening kidney disease, which include: Headaches. Abnormally dark or light skin. Easy bruising. Frequent hiccups. Chest pain. Shortness of breath. End of menstruation in women. Seizures. Confusion or altered mental status. Abdominal or back pain. Itchiness. You have a fever. Your body is producing less urine. You have pain or bleeding when you urinate.

## 2020-11-14 ENCOUNTER — Telehealth: Payer: Self-pay | Admitting: Internal Medicine

## 2020-11-14 NOTE — Telephone Encounter (Signed)
---  Caller states wants RX authorized; having panic attacks; Went to ED last night; Caller states he needs Citalopram. Last dose was approx 3-4 days ago. Caller states pharmacy said they are waiting for authorization. [facility], [address], [phone]. Caller stated they did EKG to check his heart and then gave him a sedative and was discharged. Only one pill for last night. Thinks it was citalopram that he was given last night in ER.  Advised to be seen in 24 hours  ALSO- caller states he spoke triage earlier about his anxiety medication, / i advised the nurse has spoken with the pharmacy and they will provide him with a loaner dose, caller states the pharmacy is now saying they need a script called in to provide the loaner dose. Caller asking the Triage Nurse to call the pharmacy back

## 2020-11-14 NOTE — Telephone Encounter (Signed)
Sorry, last OV mar 2021  Needs ROV for further ROV due to office refill policy

## 2020-11-15 ENCOUNTER — Telehealth: Payer: Self-pay

## 2020-11-15 NOTE — Telephone Encounter (Signed)
Pt notified that last OV in 2021 & appt will be needed for refills & anxiety.  Pt verb understanding; states he has financial constraints & appt will have to be made after 8/9.

## 2020-11-15 NOTE — Telephone Encounter (Signed)
[facility] Patient Advocate Encounter  Completed and sent V[employer] application for Apretude for this patient who is uninsured.    Patient assistance phone number for follow up is [phone].   This encounter will be updated until final determination.   Athina Fahey, CPhT Specialty Pharmacy Patient [facility] Phone: [phone] Fax:  [phone]  

## 2020-11-17 ENCOUNTER — Telehealth: Payer: Self-pay

## 2020-11-17 NOTE — Telephone Encounter (Signed)
[facility] Patient Advocate Encounter  Completed and sent [employer] application for Apretude for this patient who is uninsured.    Patient is approved 11/16/20 through 10/2721.     Clearance [name], CPhT Specialty Pharmacy Patient [facility] Phone: [phone] Fax:  [phone]

## 2020-11-22 ENCOUNTER — Other Ambulatory Visit (HOSPITAL_COMMUNITY): Payer: Self-pay

## 2020-11-23 ENCOUNTER — Encounter (HOSPITAL_COMMUNITY): Payer: Self-pay | Admitting: Emergency Medicine

## 2020-11-23 ENCOUNTER — Emergency Department (HOSPITAL_COMMUNITY)
Admission: EM | Admit: 2020-11-23 | Discharge: 2020-11-23 | Disposition: A | Discharge status: Home / Self Care | Payer: Self-pay | Attending: Emergency Medicine | Admitting: Emergency Medicine

## 2020-11-23 DIAGNOSIS — Z79899 Other long term (current) drug therapy: Secondary | ICD-10-CM | POA: Insufficient documentation

## 2020-11-23 DIAGNOSIS — Z7982 Long term (current) use of aspirin: Secondary | ICD-10-CM | POA: Insufficient documentation

## 2020-11-23 DIAGNOSIS — Z87891 Personal history of nicotine dependence: Secondary | ICD-10-CM | POA: Insufficient documentation

## 2020-11-23 DIAGNOSIS — Z760 Encounter for issue of repeat prescription: Secondary | ICD-10-CM | POA: Insufficient documentation

## 2020-11-23 DIAGNOSIS — F419 Anxiety disorder, unspecified: Secondary | ICD-10-CM | POA: Insufficient documentation

## 2020-11-23 DIAGNOSIS — I10 Essential (primary) hypertension: Secondary | ICD-10-CM | POA: Insufficient documentation

## 2020-11-23 MED ORDER — CITALOPRAM HYDROBROMIDE 20 MG PO TABS: 20.0000 mg | ORAL_TABLET | Freq: Every day | ORAL | 0 refills | Status: DC

## 2020-11-23 NOTE — Discharge Instructions (Signed)
Continue taking home medications as prescribed. ?

## 2020-11-23 NOTE — ED Triage Notes (Signed)
Patient here for refill if citalopram 20 mg. Patient states he has an appointment August 10 to see PCP for refill but states PCP has refused to refill prescription without seeing the patient in person. Patient states he has been out of the medication approximately six days. Patient alert, oriented, and in no apparent distress at this time.

## 2020-11-23 NOTE — ED Provider Notes (Signed)
[facility] EMERGENCY DEPARTMENT Provider Note   CSN: [id number] Arrival date & time: 11/23/20  1422     History Chief Complaint  Patient presents with   Medication Refill    Mason Morales is a 65 y.o. male presenting for medication refill.  Patient states he has been out of his Celexa for 6 days.  He is called his PCP, who sent into pills, however his appointment is not until next week, on the 10th.  He has a history of anxiety and depression.  He has been feeling more anxious recently.  He is not out of any other medications.  HPI     Past Medical History:  Diagnosis Date   Allergic rhinitis, cause unspecified 10/26/2010   Allergy    DDD (degenerative disc disease), cervical 10/26/2010   DDD (degenerative disc disease), lumbar 10/26/2010   Depression    Generalized headaches    GERD (gastroesophageal reflux disease)    HTN (hypertension) 10/26/2010   Hypertension    Kidney stones    Panic attack     Patient Active Problem List   Diagnosis Date Noted   Chronic constipation 07/22/2019   Left lumbar radiculopathy 03/26/2017   Acute sinus infection 12/14/2016   Pelvic pain 08/24/2016   Left cervical radiculopathy 05/20/2015   Anxiety state 05/20/2015   GERD (gastroesophageal reflux disease) 10/26/2010   Allergic rhinitis 10/26/2010   HTN (hypertension) 10/26/2010   Kidney stones 10/26/2010   DDD (degenerative disc disease), cervical 10/26/2010   DDD (degenerative disc disease), lumbar 10/26/2010   Left shoulder pain 10/26/2010   Preventative health care 10/26/2010    Past Surgical History:  Procedure Laterality Date   MOUTH SURGERY  at 65 yo after horse accident       Family History  Problem Relation Age of Onset   Hyperlipidemia Other    Heart disease Other    Stroke Other    Hypertension Other    Diabetes Other    Cancer Other        prostate cancer   Diabetes Other    Alcohol abuse Other    Cancer Other        breast cancer    Stroke Other    Colon cancer Neg Hx    Esophageal cancer Neg Hx    Pancreatic cancer Neg Hx    Rectal cancer Neg Hx    Stomach cancer Neg Hx     Social History   Tobacco Use   Smoking status: Former    Types: Cigarettes    Quit date: 03/20/2014    Years since quitting: 6.6   Smokeless tobacco: Never  Vaping Use   Vaping Use: Never used  Substance Use Topics   Alcohol use: Yes    Alcohol/week: 6.0 standard drinks    Types: 6 Cans of beer per week    Comment: occ   Drug use: Yes    Types: Marijuana    Comment: THC-daily    Home Medications Prior to Admission medications   Medication Sig Start Date End Date Taking? Authorizing Provider  acetaminophen (TYLENOL) 325 MG tablet Take 2 tablets (650 mg total) by mouth every 6 (six) hours as needed. Do not take more than 4000mg  of tylenol per day 08/07/17   Couture, Cortni S, PA-C  amLODipine (NORVASC) 10 MG tablet Take 1 tablet (10 mg total) by mouth daily. 07/22/19   07/24/19, MD  aspirin 81 MG EC tablet Take 1 tablet (81 mg total)  by mouth daily. Swallow whole. 03/18/12   Corwin Levins, MD  citalopram (CELEXA) 20 MG tablet Take 1 tablet (20 mg total) by mouth daily. 11/23/20   Katlen Seyer, PA-C  dicyclomine (BENTYL) 20 MG tablet Take 1 tablet (20 mg total) by mouth 2 (two) times daily. 04/02/18   Corwin Levins, MD  Lactulose 20 GM/30ML SOLN 30 ml by mouth up to three times daily 07/22/19   Corwin Levins, MD  meloxicam (MOBIC) 15 MG tablet Take 1 tablet (15 mg total) by mouth daily as needed for pain. 04/02/18   Corwin Levins, MD  omeprazole (PRILOSEC) 20 MG capsule Take 1 capsule (20 mg total) by mouth daily as needed (for heartburn). 08/16/16   Corwin Levins, MD  Study - HPTN [id number] (Step 4, open-label) - cabotegravir 600 mg/3 mL intramuscular injection (PI-Van Dam) Inject 3 mLs (600 mg total) into the muscle every 8 (eight) weeks. 10/16/19   Randall Hiss, MD    Allergies    Isovue [iopamidol] and Sulfa  antibiotics  Review of Systems   Review of Systems  Neurological:  Negative for headaches.  Psychiatric/Behavioral:  Negative for confusion. The patient is nervous/anxious.    Physical Exam Updated Vital Signs BP (!) 196/96 (BP Location: Left Arm)   Pulse 64   Temp 98.6 F (37 C) (Oral)   Resp 18   SpO2 97%   Physical Exam Vitals and nursing note reviewed.  Constitutional:      General: He is not in acute distress.    Appearance: He is well-developed.     Comments: nontoxic  HENT:     Head: Normocephalic and atraumatic.  Eyes:     Extraocular Movements: Extraocular movements intact.  Cardiovascular:     Rate and Rhythm: Normal rate.  Pulmonary:     Effort: Pulmonary effort is normal.  Abdominal:     General: There is no distension.  Musculoskeletal:        General: Normal range of motion.     Cervical back: Normal range of motion.  Skin:    General: Skin is warm.     Findings: No rash.  Neurological:     Mental Status: He is alert and oriented to person, place, and time.    ED Results / Procedures / Treatments   Labs (all labs ordered are listed, but only abnormal results are displayed) Labs Reviewed - No data to display  EKG None  Radiology No results found.  Procedures Procedures   Medications Ordered in ED Medications - No data to display  ED Course  I have reviewed the triage vital signs and the nursing notes.  Pertinent labs & imaging results that were available during my care of the patient were reviewed by me and considered in my medical decision making (see chart for details).    MDM Rules/Calculators/A&P                           Patient presenting for medication refill.  On exam, patient appears nontoxic.  He reports he is feeling slightly anxious, however at this time, patient does not appear to be a danger to himself or others.  Will refill medication until patient can get to her appointment next week.  Per chart review, patient has  appointment scheduled on the 10th.  Discussed importance of taking medication once a day as prescribed and importance of continue to follow-up with PCP.  At  this time, patient appears safe for discharge.  Return precautions given.  Patient states he understands and agrees to plan.  Final Clinical Impression(s) / ED Diagnoses Final diagnoses:  Medication refill  Anxiety    Rx / DC Orders ED Discharge Orders          Ordered    citalopram (CELEXA) 20 MG tablet  Daily        11/23/20 1442             Cinde Ebert, PA-C 11/23/20 1452    Gloris Manchester, MD 11/25/20 [phone]

## 2020-11-29 ENCOUNTER — Telehealth: Payer: Self-pay

## 2020-11-29 NOTE — Telephone Encounter (Signed)
[facility] Patient Advocate Encounter  Patient's medication (Apretude) have been couriered to [facility] from [facility] and will be administered on patient next office visit on 12/06/20.   (ViiVConnect Patient Assistance Program)  Clearance [name] , CPhT Specialty Pharmacy Patient [facility] Phone: [phone] Fax:  [phone]

## 2020-11-30 ENCOUNTER — Other Ambulatory Visit: Payer: Self-pay

## 2020-11-30 ENCOUNTER — Ambulatory Visit (INDEPENDENT_AMBULATORY_CARE_PROVIDER_SITE_OTHER): Payer: Self-pay | Admitting: Internal Medicine

## 2020-11-30 ENCOUNTER — Encounter: Payer: Self-pay | Admitting: Internal Medicine

## 2020-11-30 VITALS — BP 160/96 | HR 101 | Temp 98.4°F | Ht 69.0 in | Wt 161.0 lb

## 2020-11-30 DIAGNOSIS — F411 Generalized anxiety disorder: Secondary | ICD-10-CM

## 2020-11-30 DIAGNOSIS — I10 Essential (primary) hypertension: Secondary | ICD-10-CM

## 2020-11-30 DIAGNOSIS — K219 Gastro-esophageal reflux disease without esophagitis: Secondary | ICD-10-CM

## 2020-11-30 DIAGNOSIS — F12980 Cannabis use, unspecified with anxiety disorder: Secondary | ICD-10-CM

## 2020-11-30 MED ORDER — AMLODIPINE BESYLATE 10 MG PO TABS: 10.0000 mg | ORAL_TABLET | Freq: Every day | ORAL | 3 refills | Status: DC

## 2020-11-30 MED ORDER — CITALOPRAM HYDROBROMIDE 20 MG PO TABS: 20.0000 mg | ORAL_TABLET | Freq: Every day | ORAL | 3 refills | Status: DC

## 2020-11-30 NOTE — Patient Instructions (Signed)
Please continue all other medications as before, and refills have been done if requested.  Please have the pharmacy call with any other refills you may need.  Please continue your efforts at being more active, low cholesterol diet, and weight control  Please keep your appointments with your specialists as you may have planned  Please make an Appointment to return for your 1 year visit, or sooner if needed 

## 2020-11-30 NOTE — Progress Notes (Deleted)
Patient ID: Mason Morales, male   DOB: 12-25-1955, 65 y.o.   MRN: [id number]         Chief Complaint:: wellness exam and Anxiety  ***       HPI:  Mason Morales is a 65 y.o. male here for wellness exam                        Also***   Wt Readings from Last 3 Encounters:  11/30/20 161 lb (73 kg)  10/22/20 158 lb 4.6 oz (71.8 kg)  10/11/20 158 lb 6 oz (71.8 kg)   BP Readings from Last 3 Encounters:  11/30/20 (!) 160/96  11/23/20 (!) 196/96  10/22/20 (!) 157/104   Immunization History  Administered Date(s) Administered   Influenza,inj,Quad PF,6+ Mos 12/14/2016, 04/02/2018   Tdap 12/14/2016   Health Maintenance Due  Topic Date Due   Zoster Vaccines- Shingrix (1 of 2) Never done   INFLUENZA VACCINE  11/21/2020      Past Medical History:  Diagnosis Date   Allergic rhinitis, cause unspecified 10/26/2010   Allergy    DDD (degenerative disc disease), cervical 10/26/2010   DDD (degenerative disc disease), lumbar 10/26/2010   Depression    Generalized headaches    GERD (gastroesophageal reflux disease)    HTN (hypertension) 10/26/2010   Hypertension    Kidney stones    Panic attack    Past Surgical History:  Procedure Laterality Date   MOUTH SURGERY  at 65 yo after horse accident    reports that he quit smoking about 6 years ago. He has never used smokeless tobacco. He reports current alcohol use of about 6.0 standard drinks of alcohol per week. He reports current drug use. Drug: Marijuana. family history includes Alcohol abuse in an other family member; Cancer in some other family members; Diabetes in some other family members; Heart disease in an other family member; Hyperlipidemia in an other family member; Hypertension in an other family member; Stroke in some other family members. Allergies  Allergen Reactions   Gadolinium Derivatives Hives   Sulfur Dioxide Nausea And Vomiting   Isovue [Iopamidol] Hives and Itching   Sulfa Antibiotics Nausea And Vomiting    Current Outpatient Medications on File Prior to Visit  Medication Sig Dispense Refill   acetaminophen (TYLENOL) 325 MG tablet Take 2 tablets (650 mg total) by mouth every 6 (six) hours as needed. Do not take more than 4000mg  of tylenol per day 30 tablet 0   amLODipine (NORVASC) 10 MG tablet Take 1 tablet (10 mg total) by mouth daily. 90 tablet 3   aspirin 81 MG EC tablet Take 1 tablet (81 mg total) by mouth daily. Swallow whole. 30 tablet 12   citalopram (CELEXA) 20 MG tablet Take 1 tablet (20 mg total) by mouth daily. 10 tablet 0   dicyclomine (BENTYL) 20 MG tablet Take 1 tablet (20 mg total) by mouth 2 (two) times daily. 20 tablet 3   Lactulose 20 GM/30ML SOLN 30 ml by mouth up to three times daily 450 mL 1   meloxicam (MOBIC) 15 MG tablet Take 1 tablet (15 mg total) by mouth daily as needed for pain. 90 tablet 3   omeprazole (PRILOSEC) 20 MG capsule Take 1 capsule (20 mg total) by mouth daily as needed (for heartburn). 30 capsule 4   Study - HPTN 083 (Step 4, open-label) - cabotegravir 600 mg/3 mL intramuscular injection (PI-Van Dam) Inject 3 mLs (600 mg total) into the  muscle every 8 (eight) weeks.     No current facility-administered medications on file prior to visit.        ROS:  All others reviewed and negative.  Objective        PE:  BP (!) 160/96 (BP Location: Left Arm, Patient Position: Sitting, Cuff Size: Normal)   Pulse (!) 101   Temp 98.4 F (36.9 C) (Oral)   Ht 5\' 9"  (1.753 m)   Wt 161 lb (73 kg)   SpO2 98%   BMI 23.78 kg/m                 Constitutional: Pt appears in NAD               HENT: Head: NCAT.                Right Ear: External ear normal.                 Left Ear: External ear normal.                Eyes: . Pupils are equal, round, and reactive to light. Conjunctivae and EOM are normal               Nose: without d/c or deformity               Neck: Neck supple. Gross normal ROM               Cardiovascular: Normal rate and regular rhythm.                  Pulmonary/Chest: Effort normal and breath sounds without rales or wheezing.                Abd:  Soft, NT, ND, + BS, no organomegaly               Neurological: Pt is alert. At baseline orientation, motor grossly intact               Skin: Skin is warm. No rashes, no other new lesions, LE edema - ***               Psychiatric: Pt behavior is normal without agitation   Micro: none  Cardiac tracings I have personally interpreted today:  none  Pertinent Radiological findings (summarize): none   Lab Results  Component Value Date   WBC 5.1 10/22/2020   HGB 12.5 (L) 10/22/2020   HCT 37.8 (L) 10/22/2020   PLT 221 10/22/2020   GLUCOSE 108 (H) 10/22/2020   CHOL 161 11/13/2018   TRIG 62 11/13/2018   HDL 54 11/13/2018   LDLDIRECT 89.0 12/30/2015   LDLCALC 93 11/13/2018   ALT 8 (L) 10/11/2020   AST 16 10/11/2020   NA 137 10/22/2020   K 3.8 10/22/2020   CL 105 10/22/2020   CREATININE 1.28 (H) 10/22/2020   BUN 12 10/22/2020   CO2 25 10/22/2020   TSH 0.88 04/02/2018   PSA 0.50 04/02/2018   Assessment/Plan:  Mason Morales is a 65 y.o. Black or African American [2] male with  has a past medical history of Allergic rhinitis, cause unspecified (10/26/2010), Allergy, DDD (degenerative disc disease), cervical (10/26/2010), DDD (degenerative disc disease), lumbar (10/26/2010), Depression, Generalized headaches, GERD (gastroesophageal reflux disease), HTN (hypertension) (10/26/2010), Hypertension, Kidney stones, and Panic attack.  No problem-specific Assessment & Plan notes found for this encounter.  Followup: No follow-ups on file.  12/27/2010, MD 11/30/2020 10:39 AM  [facility]

## 2020-12-05 ENCOUNTER — Encounter: Payer: Self-pay | Admitting: Internal Medicine

## 2020-12-05 DIAGNOSIS — F12980 Cannabis use, unspecified with anxiety disorder: Secondary | ICD-10-CM | POA: Insufficient documentation

## 2020-12-05 NOTE — Progress Notes (Signed)
Patient ID: Mason Morales, male   DOB: November 13, 1955, 65 y.o.   MRN: [id number]        Chief Complaint: follow up HTN, hyperglycemia, panic, chronic cannabis use        HPI:  Mason Morales is a 65 y.o. male here overall doing ok,  Has no insurance, does not want labs, but has run out of meds x 2 wks and asks to restart. Pt denies chest pain, increased sob or doe, wheezing, orthopnea, PND, increased LE swelling, palpitations, dizziness or syncope.   Pt denies polydipsia, polyuria, or new focal neuro s/s.  Pt denies fever, wt loss, night sweats, loss of appetite, or other constitutional symptoms  No other new complaints  Has had mild worsening reflux, but no abd pain, dysphagia, n/v, bowel change or blood.       Wt Readings from Last 3 Encounters:  11/30/20 161 lb (73 kg)  10/22/20 158 lb 4.6 oz (71.8 kg)  10/11/20 158 lb 6 oz (71.8 kg)   BP Readings from Last 3 Encounters:  11/30/20 (!) 160/96  11/23/20 (!) 196/96  10/22/20 (!) 157/104         Past Medical History:  Diagnosis Date   Allergic rhinitis, cause unspecified 10/26/2010   Allergy    DDD (degenerative disc disease), cervical 10/26/2010   DDD (degenerative disc disease), lumbar 10/26/2010   Depression    Generalized headaches    GERD (gastroesophageal reflux disease)    HTN (hypertension) 10/26/2010   Hypertension    Kidney stones    Panic attack    Past Surgical History:  Procedure Laterality Date   MOUTH SURGERY  at 65 yo after horse accident    reports that he quit smoking about 6 years ago. He has never used smokeless tobacco. He reports current alcohol use of about 6.0 standard drinks per week. He reports current drug use. Drug: Marijuana. family history includes Alcohol abuse in an other family member; Cancer in some other family members; Diabetes in some other family members; Heart disease in an other family member; Hyperlipidemia in an other family member; Hypertension in an other family member; Stroke in some other  family members. Allergies  Allergen Reactions   Gadolinium Derivatives Hives   Sulfur Dioxide Nausea And Vomiting   Isovue [Iopamidol] Hives and Itching   Sulfa Antibiotics Nausea And Vomiting   Current Outpatient Medications on File Prior to Visit  Medication Sig Dispense Refill   acetaminophen (TYLENOL) 325 MG tablet Take 2 tablets (650 mg total) by mouth every 6 (six) hours as needed. Do not take more than 4000mg  of tylenol per day 30 tablet 0   aspirin 81 MG EC tablet Take 1 tablet (81 mg total) by mouth daily. Swallow whole. 30 tablet 12   dicyclomine (BENTYL) 20 MG tablet Take 1 tablet (20 mg total) by mouth 2 (two) times daily. 20 tablet 3   Lactulose 20 GM/30ML SOLN 30 ml by mouth up to three times daily 450 mL 1   meloxicam (MOBIC) 15 MG tablet Take 1 tablet (15 mg total) by mouth daily as needed for pain. 90 tablet 3   omeprazole (PRILOSEC) 20 MG capsule Take 1 capsule (20 mg total) by mouth daily as needed (for heartburn). 30 capsule 4   Study - HPTN 083 (Step 4, open-label) - cabotegravir 600 mg/3 mL intramuscular injection (PI-Van Dam) Inject 3 mLs (600 mg total) into the muscle every 8 (eight) weeks.     No current facility-administered medications  on file prior to visit.        ROS:  All others reviewed and negative.  Objective        PE:  BP (!) 160/96 (BP Location: Left Arm, Patient Position: Sitting, Cuff Size: Normal)   Pulse (!) 101   Temp 98.4 F (36.9 C) (Oral)   Ht 5\' 9"  (1.753 m)   Wt 161 lb (73 kg)   SpO2 98%   BMI 23.78 kg/m                 Constitutional: Pt appears in NAD               HENT: Head: NCAT.                Right Ear: External ear normal.                 Left Ear: External ear normal.                Eyes: . Pupils are equal, round, and reactive to light. Conjunctivae and EOM are normal               Nose: without d/c or deformity               Neck: Neck supple. Gross normal ROM               Cardiovascular: Normal rate and regular  rhythm.                 Pulmonary/Chest: Effort normal and breath sounds without rales or wheezing.                Abd:  Soft, NT, ND, + BS, no organomegaly               Neurological: Pt is alert. At baseline orientation, motor grossly intact               Skin: Skin is warm. No rashes, no other new lesions, LE edema - none               Psychiatric: Pt behavior is normal without agitation   Micro: none  Cardiac tracings I have personally interpreted today:  none  Pertinent Radiological findings (summarize): none   Lab Results  Component Value Date   WBC 5.1 10/22/2020   HGB 12.5 (L) 10/22/2020   HCT 37.8 (L) 10/22/2020   PLT 221 10/22/2020   GLUCOSE 108 (H) 10/22/2020   CHOL 161 11/13/2018   TRIG 62 11/13/2018   HDL 54 11/13/2018   LDLDIRECT 89.0 12/30/2015   LDLCALC 93 11/13/2018   ALT 8 (L) 10/11/2020   AST 16 10/11/2020   NA 137 10/22/2020   K 3.8 10/22/2020   CL 105 10/22/2020   CREATININE 1.28 (H) 10/22/2020   BUN 12 10/22/2020   CO2 25 10/22/2020   TSH 0.88 04/02/2018   PSA 0.50 04/02/2018   Assessment/Plan:  Mason Morales is a 65 y.o. Black or African American [2] male with  has a past medical history of Allergic rhinitis, cause unspecified (10/26/2010), Allergy, DDD (degenerative disc disease), cervical (10/26/2010), DDD (degenerative disc disease), lumbar (10/26/2010), Depression, Generalized headaches, GERD (gastroesophageal reflux disease), HTN (hypertension) (10/26/2010), Hypertension, Kidney stones, and Panic attack.  HTN (hypertension) Uncontrolled, for med restart, f/u BP at home and next visit,  to f/u any worsening symptoms or concerns  GERD (gastroesophageal reflux disease) Mild to mod, for restart PPI, to f/u any worsening  symptoms or concerns  Cannabis use with anxiety disorder (HCC) Pt urged to abstain due to risk of further psychiatric or malignancy   Anxiety state Chronic persistent, for restart celexa,  to f/u any worsening symptoms or  concerns  Followup: Return in about 1 year (around 11/30/2021).  Oliver Barre, MD 12/05/2020 12:29 PM [facility]

## 2020-12-05 NOTE — Assessment & Plan Note (Signed)
Pt urged to abstain due to risk of further psychiatric or malignancy

## 2020-12-05 NOTE — Assessment & Plan Note (Signed)
Chronic persistent, for restart celexa,  to f/u any worsening symptoms or concerns

## 2020-12-05 NOTE — Assessment & Plan Note (Signed)
Mild to mod, for restart PPI, to f/u any worsening symptoms or concerns

## 2020-12-05 NOTE — Assessment & Plan Note (Signed)
Uncontrolled, for med restart, f/u BP at home and next visit,  to f/u any worsening symptoms or concerns

## 2020-12-06 ENCOUNTER — Ambulatory Visit (INDEPENDENT_AMBULATORY_CARE_PROVIDER_SITE_OTHER): Payer: Self-pay | Admitting: Pharmacist

## 2020-12-06 ENCOUNTER — Other Ambulatory Visit: Payer: Self-pay

## 2020-12-06 DIAGNOSIS — Z79899 Other long term (current) drug therapy: Secondary | ICD-10-CM

## 2020-12-06 DIAGNOSIS — Z006 Encounter for examination for normal comparison and control in clinical research program: Secondary | ICD-10-CM

## 2020-12-06 MED ORDER — CABOTEGRAVIR ER 600 MG/3ML IM SUER
600.0000 mg | Freq: Once | INTRAMUSCULAR | Status: AC
  Administered 2020-12-06: 600 mg via INTRAMUSCULAR

## 2020-12-06 NOTE — Progress Notes (Signed)
HPI: Mason Morales is a 65 y.o. male who presents to the [facility] pharmacy clinic for Apretude administration and HIV PrEP follow up.  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   Chronic constipation 07/22/2019   Left lumbar radiculopathy 03/26/2017   Acute sinus infection 12/14/2016   Pelvic pain 08/24/2016   Left cervical radiculopathy 05/20/2015   Anxiety state 05/20/2015   GERD (gastroesophageal reflux disease) 10/26/2010   Allergic rhinitis 10/26/2010   HTN (hypertension) 10/26/2010   Kidney stones 10/26/2010   DDD (degenerative disc disease), cervical 10/26/2010   DDD (degenerative disc disease), lumbar 10/26/2010   Left shoulder pain 10/26/2010   Preventative health care 10/26/2010    Patient's Medications  New Prescriptions   No medications on file  Previous Medications   ACETAMINOPHEN (TYLENOL) 325 MG TABLET    Take 2 tablets (650 mg total) by mouth every 6 (six) hours as needed. Do not take more than 4000mg  of tylenol per day   AMLODIPINE (NORVASC) 10 MG TABLET    Take 1 tablet (10 mg total) by mouth daily.   ASPIRIN 81 MG EC TABLET    Take 1 tablet (81 mg total) by mouth daily. Swallow whole.   CITALOPRAM (CELEXA) 20 MG TABLET    Take 1 tablet (20 mg total) by mouth daily.   DICYCLOMINE (BENTYL) 20 MG TABLET    Take 1 tablet (20 mg total) by mouth 2 (two) times daily.   LACTULOSE 20 GM/30ML SOLN    30 ml by mouth up to three times daily   MELOXICAM (MOBIC) 15 MG TABLET    Take 1 tablet (15 mg total) by mouth daily as needed for pain.   OMEPRAZOLE (PRILOSEC) 20 MG CAPSULE    Take 1 capsule (20 mg total) by mouth daily as needed (for heartburn).   STUDY - HPTN 083 (STEP 4, OPEN-LABEL) - CABOTEGRAVIR 600 MG/3 ML INTRAMUSCULAR INJECTION (PI-VAN DAM)    Inject 3 mLs (600 mg total) into the muscle every 8 (eight) weeks.  Modified Medications   No medications on file  Discontinued Medications   No medications on  file    Allergies: Allergies  Allergen Reactions   Gadolinium Derivatives Hives   Sulfur Dioxide Nausea And Vomiting   Isovue [Iopamidol] Hives and Itching   Sulfa Antibiotics Nausea And Vomiting    Past Medical History: Past Medical History:  Diagnosis Date   Allergic rhinitis, cause unspecified 10/26/2010   Allergy    DDD (degenerative disc disease), cervical 10/26/2010   DDD (degenerative disc disease), lumbar 10/26/2010   Depression    Generalized headaches    GERD (gastroesophageal reflux disease)    HTN (hypertension) 10/26/2010   Hypertension    Kidney stones    Panic attack     Social History: Social History   Socioeconomic History   Marital status: Single    Spouse name: Not on file   Number of children: Not on file   Years of education: 14   Highest education level: Not on file  Occupational History   Occupation: 12/27/2010    Employer: [employer]  Tobacco Use   Smoking status: Former    Types: Cigarettes    Quit date: 03/20/2014    Years since quitting: 6.7   Smokeless tobacco: Never  Vaping Use   Vaping Use: Never used  Substance and Sexual Activity   Alcohol use: Yes    Alcohol/week: 6.0 standard  drinks    Types: 6 Cans of beer per week    Comment: occ   Drug use: Yes    Types: Marijuana    Comment: THC-daily   Sexual activity: Not on file  Other Topics Concern   Not on file  Social History Narrative   Not on file   Social Determinants of Health   Financial Resource Strain: Not on file  Food Insecurity: Not on file  Transportation Needs: Not on file  Physical Activity: Not on file  Stress: Not on file  Social Connections: Not on file    Labs: Lab Results  Component Value Date   HIV1RNAQUANT Not Detected 10/11/2020   HIV1RNAQUANT Not Detected 08/18/2020   HIV1RNAQUANT Not Detected 06/20/2020    RPR and STI Lab Results  Component Value Date   LABRPR NON-REACTIVE 10/11/2020   LABRPR REACTIVE (A) 04/26/2020   LABRPR  REACTIVE (A) 04/30/2019   LABRPR REACTIVE (A) 11/13/2018   LABRPR REACTIVE (A) 05/26/2018   RPRTITER 1:2 (H) 04/26/2020   RPRTITER 1:2 (H) 04/30/2019   RPRTITER 1:2 (H) 11/13/2018   RPRTITER 1:2 (H) 05/26/2018   RPRTITER 1:1 (H) 12/16/2017    STI Results GC CT  11/08/2016 NOT DETECTED NOT DETECTED    Hepatitis B Lab Results  Component Value Date   HEPBSAB REACTIVE (A) 11/08/2016   HEPBSAG NEGATIVE 10/23/2016   Hepatitis C Lab Results  Component Value Date   HEPCAB NON-REACTIVE 10/11/2020   Hepatitis A No results found for: HAV Lipids: Lab Results  Component Value Date   CHOL 161 11/13/2018   TRIG 62 11/13/2018   HDL 54 11/13/2018   CHOLHDL 3.0 11/13/2018   VLDL 09.7 04/02/2018   LDLCALC 93 11/13/2018    TARGET DATE: The 16th of the month  Assessment: Maxson presents today for their Apretude injection and to follow up for HIV PrEP. Patient is transitioning off of the HPTN research study. Past injection was tolerated well without issues. No problems with systemic side effects of injection. No signs or symptoms of anything today. Screened patient for acute HIV symptoms such as fatigue, muscle aches, rash, sore throat, lymphadenopathy, headache, night sweats, nausea/vomiting/diarrhea, and fever. Patient denies any symptoms. No new partners since last injection. Rapid HIV antibody blood test was drawn immediately prior to injection and was negative. Patient also had HIV RNA drawn today.   Administered cabotegravir 600mg /82mL in right upper outer quadrant of the gluteal muscle. Monitored patient for 10 minutes after injection. Injection was tolerated well without issue.  Last STI screening was 6/21 and was negative. No need for further kidney monitoring with Apretude. Will see him back in 2 months for injection, labs, and HIV PrEP follow up.  Plan: - Apretude injection administered - Next injection, labs, and PrEP follow up appointment scheduled for 10/17 with [facility] - Call  with any issues or questions  Garris Melhorn L. Cedrica Brune, PharmD, BCIDP, AAHIVP, CPP Clinical Pharmacist Practitioner Infectious Diseases Clinical Pharmacist [facility]

## 2020-12-08 LAB — HIV-1 RNA QUANT-NO REFLEX-BLD
HIV 1 RNA Quant: NOT DETECTED Copies/mL
HIV-1 RNA Quant, Log: NOT DETECTED Log cps/mL

## 2020-12-28 ENCOUNTER — Ambulatory Visit (INDEPENDENT_AMBULATORY_CARE_PROVIDER_SITE_OTHER): Payer: Self-pay | Admitting: Physician Assistant

## 2020-12-28 ENCOUNTER — Encounter: Payer: Self-pay | Admitting: Physician Assistant

## 2020-12-28 ENCOUNTER — Other Ambulatory Visit: Payer: Self-pay

## 2020-12-28 VITALS — BP 130/79 | HR 76 | Temp 97.4°F | Resp 18 | Wt 155.2 lb

## 2020-12-28 DIAGNOSIS — L03818 Cellulitis of other sites: Secondary | ICD-10-CM

## 2020-12-28 DIAGNOSIS — B04 Monkeypox: Secondary | ICD-10-CM

## 2020-12-28 DIAGNOSIS — Z113 Encounter for screening for infections with a predominantly sexual mode of transmission: Secondary | ICD-10-CM

## 2020-12-28 MED ORDER — MUPIROCIN CALCIUM 2 % EX CREA: 1.0000 "application " | TOPICAL_CREAM | Freq: Two times a day (BID) | CUTANEOUS | 0 refills | Status: DC

## 2020-12-28 MED ORDER — DOXYCYCLINE HYCLATE 50 MG PO CAPS: 50.0000 mg | ORAL_CAPSULE | Freq: Two times a day (BID) | ORAL | 0 refills | Status: AC

## 2020-12-28 MED ORDER — TPOXX 200 MG PO CAPS: 600.0000 mg | ORAL_CAPSULE | Freq: Two times a day (BID) | ORAL | 0 refills | Status: AC

## 2020-12-28 NOTE — Patient Instructions (Signed)
 START Tecovirimat - 3 capsules taken TOGETHER twice a day with high fat meal before your medication, as mentioned below    You need 25 grams of fat 30 minutes before each dose of your medication:   This could look like:  -1 avocado -1/4 cup whole cashews  -1/2 cup almonds  -3 tablespoons of nut butter (almond, peanut, cashew)  -4 oz of cooked ground beef 80/20 fat content  -2.5 tablespoons of butter (put it on a bagel, bread!) -3.5 oz goat cheese (regular fat)   You may still experience new lesions coming up over the next 72 hours as the medication begins to work.   Please consider filling out the daily medication / symptom tracker for your convenience. Bring this with you to each follow up visit so we can see how your progress is doing.   You will need to stay isolated at home with well fitting mask over your nose and mouth for 3 weeks after your first rash came up, or until they are all crusted and fall off, whichever is first.   Disinfect common surfaces, bed & bath linens and clothing.   Would like to see you back in 1 week to discuss further - can be done over video visit if you prefer and are overall doing well.   See below for more information.    HUMAN MONKEYPOX ISOLATION RECOMMENDATIONS https://www.cdc.gov/poxvirus/monkeypox/clinicians/isolation-procedures.html  A person with monkeypox can spread it to others from the time symptoms start until the rash has fully healed and a fresh layer of skin has formed. The illness typically lasts 2-4 weeks  Monkeypox can spread to anyone through close, personal, often skin-to-skin contact, including: Direct contact with monkeypox rash, scabs, or body fluids from a person with monkeypox. Touching objects, fabrics (clothing, bedding, or towels), and surfaces that have been used by someone with monkeypox. Contact with respiratory secretions. This direct contact can happen during intimate contact, including: Oral, anal, and vaginal sex  or touching the genitals (penis, testicles, labia, and vagina) or anus (butthole) of a person with monkeypox. Hugging, massage, and kissing. Prolonged face-to-face contact. Touching fabrics and objects during sex that were used by a person with monkeypox and that have not been disinfected, such as bedding, towels, fetish gear, and sex toys. A pregnant person can spread the virus to their fetus through the placenta. It's also possible for people to get monkeypox from infected animals, either by being scratched or bitten by the animal or by preparing or eating meat or using products from an infected animal. Isolation typically lasts two to four weeks.   If you are unable to isolate from others:  While symptomatic with a fever or any respiratory symptoms, including sore throat, nasal congestion, or cough, remain isolated in the home and away from others unless it is necessary to see a healthcare provider or for an emergency. This includes avoiding close or physical contact with other people and animals. Cover the lesions, wear a well-fitting mask (more information below), and avoid public transportation when leaving the home as required for medical care or an emergency. While a rash persists but in the absence of a fever or respiratory symptoms Cover all parts of the rash with clothing, gloves, and/or bandages. Wear a well-fitting mask to prevent the wearer from spreading oral and respiratory secretions when interacting with others until the rash and all other symptoms have resolved. Masks should fit closely on the face without any gaps along the edges or around the nose   and be comfortable when worn properly over the nose and mouth.  Until all signs and symptoms of monkeypox illness have fully resolved Do not share items that have been worn or handled with other people or animals. Launder or disinfect items that have been worn or handled and surfaces that have been touched by a lesion. Avoid close  physical contact, including sexual and/or close intimate contact, with other people. Avoid sharing utensils or cups. Items should be cleaned and disinfected before use by others. Avoid crowds and congregate settings. Wash hands often with soap and water or use an alcohol-based hand sanitizer, especially after direct contact with the rash.    Prevention in your Pets:  People with monkeypox should avoid contact with animals (specifically mammals), including pets. If possible, friends or family members should care for healthy animals until the owner has fully recovered. Keep any potentially infectious bandages, textiles (such as clothes, bedding) and other items away from pets, other domestic animals, and wildlife. In general, any mammal may become infected with monkeypox. It is not thought that other animals such as reptiles, fish or birds can be infected. If you notice an animal that had contact with an infected person appears sick (such as lethargy, lack of appetite, coughing, bloating, nasal or eye secretions or crust, fever, rash) contact the owner's veterinarian, state public health veterinarian, or state animal health official.   

## 2020-12-28 NOTE — Progress Notes (Signed)
Subjective:    Patient ID: Mason Morales, male    DOB: 29-Jul-1955, 65 y.o.   MRN: [id number]  Chief Complaint  Patient presents with   Rash    Concern for mpx      HPI:  Mason Morales is a 65 y.o. male who presents today for concerns regarding acute onset rash.  Prior to lesions developing he experienced groin pain, malaise, anorexia, and nausea for 2 days.  Lesions began 12/23/20 described as raised and red, pruritic.  He has been scratching at them and finds they have become larger and increasingly more red with some purulent discharge and swelling. He has not recently traveled, he has no known close contacts with similar symptoms. 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  He is MSM on PrEP (apretude) last close contact with cuddling and kissing only on 12/16/20-this was a casual friend, he has no anonymous sex partners.  He was vaccinated previously for smallpox as a child-he has the card at home and will send me a copy. Does report mild rectal discomfort but believes it is related to hemorrhoid. He denies all other symptoms.    Allergies  Allergen Reactions   Gadolinium Derivatives Hives   Sulfur Dioxide Nausea And Vomiting   Isovue [Iopamidol] Hives and Itching   Sulfa Antibiotics Nausea And Vomiting      Outpatient Medications Prior to Visit  Medication Sig Dispense Refill   acetaminophen (TYLENOL) 325 MG tablet Take 2 tablets (650 mg total) by mouth every 6 (six) hours as needed. Do not take more than 4045m of tylenol per day 30 tablet 0   amLODipine (NORVASC) 10 MG tablet Take 1 tablet (10 mg total) by mouth daily. 90 tablet 3   aspirin 81 MG EC tablet Take 1 tablet (81 mg total) by mouth daily. Swallow whole. 30 tablet 12   citalopram (CELEXA) 20 MG tablet Take 1 tablet (20 mg total) by mouth daily. 90 tablet 3   dicyclomine (BENTYL) 20 MG tablet Take 1 tablet (20 mg total) by mouth 2 (two) times daily. 20 tablet 3   Lactulose 20 GM/30ML SOLN 30 ml by mouth up to  three times daily 450 mL 1   meloxicam (MOBIC) 15 MG tablet Take 1 tablet (15 mg total) by mouth daily as needed for pain. 90 tablet 3   omeprazole (PRILOSEC) 20 MG capsule Take 1 capsule (20 mg total) by mouth daily as needed (for heartburn). 30 capsule 4   Study - HPTN 083 (Step 4, open-label) - cabotegravir 600 mg/3 mL intramuscular injection (PI-Van Dam) Inject 3 mLs (600 mg total) into the muscle every 8 (eight) weeks.     No facility-administered medications prior to visit.     Past Medical History:  Diagnosis Date   Allergic rhinitis, cause unspecified 10/26/2010   Allergy    DDD (degenerative disc disease), cervical 10/26/2010   DDD (degenerative disc disease), lumbar 10/26/2010   Depression    Generalized headaches    GERD (gastroesophageal reflux disease)    HTN (hypertension) 10/26/2010   Hypertension    Kidney stones    Panic attack      Past Surgical History:  Procedure Laterality Date   MOUTH SURGERY  at 65yo after horse accident       Review of Systems  Constitutional:  Negative for activity change, chills, fatigue, fever and unexpected weight change.  HENT:  Negative for mouth sores, postnasal drip and sore throat.   Respiratory:  Negative for cough, shortness of breath and wheezing.   Cardiovascular:  Negative for chest pain and palpitations.  Gastrointestinal:  Positive for rectal pain. Negative for abdominal pain, anal bleeding, blood in stool, constipation, diarrhea, nausea and vomiting.  Genitourinary:  Positive for genital sores. Negative for dysuria, frequency, hematuria, penile discharge, penile pain and testicular pain.  Musculoskeletal: Negative.   Skin:  Positive for rash.  Allergic/Immunologic: Negative for immunocompromised state.  Neurological:  Positive for seizures. Negative for dizziness, weakness and headaches.  Hematological:  Positive for adenopathy.     Objective:    BP 130/79   Pulse 76   Temp (!) 97.4 F (36.3 C)   Resp 18    Wt 155 lb 3.2 oz (70.4 kg)   BMI 22.92 kg/m  Nursing note and vital signs reviewed.  Physical Exam Vitals reviewed.  Constitutional:      General: He is not in acute distress.    Appearance: Normal appearance. He is normal weight. He is not ill-appearing.  HENT:     Head: Normocephalic and atraumatic.     Nose: Nose normal.     Mouth/Throat:     Mouth: Mucous membranes are moist.     Pharynx: Oropharynx is clear. No oropharyngeal exudate or posterior oropharyngeal erythema.  Eyes:     Extraocular Movements: Extraocular movements intact.     Conjunctiva/sclera: Conjunctivae normal.     Pupils: Pupils are equal, round, and reactive to light.  Cardiovascular:     Rate and Rhythm: Normal rate and regular rhythm.  Pulmonary:     Effort: Pulmonary effort is normal.     Breath sounds: Normal breath sounds.  Abdominal:     General: Bowel sounds are normal.  Genitourinary:    Penis: Normal and circumcised.   Musculoskeletal:        General: Normal range of motion.     Cervical back: Normal range of motion.  Lymphadenopathy:     Cervical: No cervical adenopathy.     Lower Body: Right inguinal adenopathy present. Left inguinal adenopathy present.  Skin:    General: Skin is warm and dry.     Findings: Lesion present.     Comments: Lesions at varying phases of pustular and vesicular measuring 1-2 cm in diameter, deep seated, indurated, exudate and purulent discharge present, ttp. Lesions located along extremities,  groin bilaterally, scrotum, and lower abdomen  Neurological:     General: No focal deficit present.     Mental Status: He is alert and oriented to person, place, and time.  Psychiatric:        Mood and Affect: Mood normal.        Behavior: Behavior normal.        Thought Content: Thought content normal.  .kp        Depression screen [facility] 2/9 11/30/2020 04/02/2018 03/26/2017 12/30/2015 11/18/2014  Decreased Interest 3 0 0 0 0  Down, Depressed, Hopeless 3 0 0 0 0  PHQ -  2 Score 6 0 0 0 0  Altered sleeping 3 - 0 - -  Tired, decreased energy 3 - 0 - -  Change in appetite 0 - 0 - -  Feeling bad or failure about yourself  0 - 0 - -  Trouble concentrating 1 - 0 - -  Moving slowly or fidgety/restless 0 - 0 - -  Suicidal thoughts 0 - 0 - -  PHQ-9 Score 13 - 0 - -  Difficult doing work/chores Very difficult - - - -  Assessment & Plan:     TPOXX Informed Consent   Subject Name: Mason Morales  Subject met inclusion and exclusion criteria.  The informed consent form, study requirements and expectations were reviewed with the subject and questions and concerns were addressed prior to the signing of the consent form.  The subject verbalized understanding of the trial requirements.  The subject agreed to participate in the CDC EA IND TPOXX trial and signed the informed consent.  The informed consent was obtained prior to performance of any protocol-specific procedures for the subject.  A copy of the signed informed consent was given to the subject and a copy was placed in the subject's medical record.  Mason Morales 12/28/2020, 4:29 PM   MPX-presumptive diagnosis made clinically consistent with secondary infection due to digital manipulation.  Advised to stop scratching lesions. Start applying mupirocin ointment twice daily and doxy 100 mg twice daily x7 days -Discussed transmission, prevention, management, isolation, treatment-all questions answered -Disinfect surfaces and sterilize linens -Intake form, original consent, RX sent to [facility] -no close contacts and no sexual activity Maintain diary  STI screening-encouraged condoms, results pending  PrEP-continue with current Apretude regimen and scheduled visits   Patient Active Problem List   Diagnosis Date Noted   Human monkeypox 12/28/2020   Cannabis use with anxiety disorder ([place]) 12/05/2020   Chronic constipation 07/22/2019   Left lumbar radiculopathy 03/26/2017   Acute sinus infection 12/14/2016    Pelvic pain 08/24/2016   Left cervical radiculopathy 05/20/2015   Anxiety state 05/20/2015   GERD (gastroesophageal reflux disease) 10/26/2010   Allergic rhinitis 10/26/2010   HTN (hypertension) 10/26/2010   Kidney stones 10/26/2010   DDD (degenerative disc disease), cervical 10/26/2010   DDD (degenerative disc disease), lumbar 10/26/2010   Left shoulder pain 10/26/2010   Preventative health care 10/26/2010     Problem List Items Addressed This Visit       Other   Human monkeypox - Primary    Presumptive diagnosis, clinically consistent with HMPX Discussed transmission, management, prevention, treatment Previous hx of smallpox vaccine as a child Provided informed consent for TPOXX with EA IND by CDC All questions answered Advised to disinfect surfaces Remain isolated Isolate from pets TPOXX to be taken 3 capsules bid x 14 days 30 minutes after high fat and caloric meal-discussed all side effects Maintain diary Follow up one week for phone visit and 2 weeks for final visit If       Relevant Medications   tecovirimat (TPOXX) capsule   mupirocin cream (BACTROBAN) 2 %   Other Relevant Orders   Monkeypox Virus DNA, Qualitative Real-Time PCR   Monkeypox Virus DNA, Qualitative Real-Time PCR   Other Visit Diagnoses     Cellulitis of other specified site       Relevant Medications   doxycycline (VIBRAMYCIN) 50 MG capsule   mupirocin cream (BACTROBAN) 2 %   Venereal disease screening       Relevant Orders   Urine cytology ancillary only   RPR   Cytology (oral, anal, urethral) ancillary only   Cytology (oral, anal, urethral) ancillary only   HIV antibody (with reflex)        I am having Mason Morales start on Tpoxx, doxycycline, and mupirocin cream. I am also having him maintain his aspirin, omeprazole, acetaminophen, meloxicam, dicyclomine, Lactulose, (HPTN 083 (Step 4, open-label) cabotegravir), amLODipine, and citalopram.   Meds ordered this encounter   Medications   tecovirimat (TPOXX) capsule    Sig: Take 3 capsules (  600 mg total) by mouth 2 (two) times daily with a meal for 14 days.    Dispense:  84 capsule    Refill:  0    Order Specific Question:   Supervising Provider    Answer:   VAN DAM, CORNELIUS N [[id number]]   doxycycline (VIBRAMYCIN) 50 MG capsule    Sig: Take 1 capsule (50 mg total) by mouth 2 (two) times daily for 7 days.    Dispense:  14 capsule    Refill:  0    Order Specific Question:   Supervising Provider    Answer:   VAN DAM, CORNELIUS N [[id number]]   mupirocin cream (BACTROBAN) 2 %    Sig: Apply 1 application topically 2 (two) times daily.    Dispense:  15 g    Refill:  0    Order Specific Question:   Supervising Provider    Answer:   VAN DAM, CORNELIUS N [[id number]]      Follow-up: Return in about 1 week (around 01/04/2021) for TPOXX interim visit.

## 2020-12-28 NOTE — Assessment & Plan Note (Signed)
Presumptive diagnosis, clinically consistent with HMPX Discussed transmission, management, prevention, treatment Previous hx of smallpox vaccine as a child Provided informed consent for TPOXX with EA IND by CDC All questions answered Advised to disinfect surfaces Remain isolated Isolate from pets TPOXX to be taken 3 capsules bid x 14 days 30 minutes after high fat and caloric meal-discussed all side effects Maintain diary Follow up one week for phone visit and 2 weeks for final visit If

## 2020-12-29 LAB — URINE CYTOLOGY ANCILLARY ONLY
Chlamydia: NEGATIVE
Comment: NEGATIVE
Comment: NORMAL
Neisseria Gonorrhea: NEGATIVE

## 2020-12-29 LAB — CYTOLOGY, (ORAL, ANAL, URETHRAL) ANCILLARY ONLY
Chlamydia: NEGATIVE
Chlamydia: NEGATIVE
Comment: NEGATIVE
Comment: NEGATIVE
Comment: NORMAL
Comment: NORMAL
Neisseria Gonorrhea: NEGATIVE
Neisseria Gonorrhea: NEGATIVE

## 2020-12-30 LAB — FLUORESCENT TREPONEMAL AB(FTA)-IGG-BLD: Fluorescent Treponemal ABS: REACTIVE — AB

## 2020-12-30 LAB — HIV ANTIBODY (ROUTINE TESTING W REFLEX): HIV 1&2 Ab, 4th Generation: NONREACTIVE

## 2020-12-30 LAB — RPR TITER: RPR Titer: 1:1 {titer} — ABNORMAL HIGH

## 2020-12-30 LAB — RPR: RPR Ser Ql: REACTIVE — AB

## 2021-01-01 LAB — MONKEYPOX VIRUS DNA, QUALITATIVE REAL-TIME PCR
MONKEYPOX VIRUS DNA, QL PCR: DETECTED — CR
MONKEYPOX VIRUS DNA, QL PCR: DETECTED — CR
Orthopoxvirus DNA, QL PCR: DETECTED — CR
Orthopoxvirus DNA, QL PCR: DETECTED — CR

## 2021-01-04 ENCOUNTER — Other Ambulatory Visit: Payer: Self-pay

## 2021-01-04 ENCOUNTER — Ambulatory Visit (INDEPENDENT_AMBULATORY_CARE_PROVIDER_SITE_OTHER): Payer: Self-pay | Admitting: Physician Assistant

## 2021-01-04 ENCOUNTER — Encounter: Payer: Self-pay | Admitting: Physician Assistant

## 2021-01-04 DIAGNOSIS — B04 Monkeypox: Secondary | ICD-10-CM

## 2021-01-04 NOTE — Progress Notes (Signed)
Subjective:    Patient ID: Mason Morales, male    DOB: [birth year], 65 y.o.   MRN: [id number]  Chief Complaint  Patient presents with   Follow-up    Interim TPOXX treatment visit for MPX     Virtual Visit via Telephone/Video Note   I connected with on 01/04/2021 at by telephone and verified that I am speaking with the correct person using two identifiers.   I discussed the limitations, risks, security and privacy concerns of performing an evaluation and management service by telephone and the availability of in person appointments. I also discussed with the patient that there may be a patient responsible charge related to this service. The patient expressed understanding and agreed to proceed.  Location:  Patient: Home Provider: Clinic   HPI:  Mason Morales is a 65 y.o. male presents today for follow up regarding HMPX treatment with TPOXX.  Symptoms began 12/21/20, lesions 12/23/20, and treatment 12/28/20.  He has been taking TPOXX as directed 30 minutes after a high fat, caloric meal twice daily.  He has missed no doses and denies Aes or SAEs. He reports improvement in symptoms/lesions on D4 of treatment and no new lesions after D3 of treatment. He reports lesions have scabbed up and begun to fall off. He has remained isolated at home without any close contact or sexual contact. He has completed doxycycline and denies lesions having any swelling, purulent discharge, or pain.  He denies fever, chills, rectal pain, sore throat, visual disturbances, diarrhea, constipation, CP, SOB, urinary symptoms, LAD or penile discharge.    Allergies  Allergen Reactions   Gadolinium Derivatives Hives   Sulfur Dioxide Nausea And Vomiting   Isovue [Iopamidol] Hives and Itching   Sulfa Antibiotics Nausea And Vomiting      Outpatient Medications Prior to Visit  Medication Sig Dispense Refill   acetaminophen (TYLENOL) 325 MG tablet Take 2 tablets (650 mg total) by mouth every 6 (six) hours as  needed. Do not take more than 4000mg  of tylenol per day 30 tablet 0   amLODipine (NORVASC) 10 MG tablet Take 1 tablet (10 mg total) by mouth daily. 90 tablet 3   aspirin 81 MG EC tablet Take 1 tablet (81 mg total) by mouth daily. Swallow whole. 30 tablet 12   citalopram (CELEXA) 20 MG tablet Take 1 tablet (20 mg total) by mouth daily. 90 tablet 3   dicyclomine (BENTYL) 20 MG tablet Take 1 tablet (20 mg total) by mouth 2 (two) times daily. 20 tablet 3   doxycycline (VIBRAMYCIN) 50 MG capsule Take 1 capsule (50 mg total) by mouth 2 (two) times daily for 7 days. 14 capsule 0   Lactulose 20 GM/30ML SOLN 30 ml by mouth up to three times daily 450 mL 1   meloxicam (MOBIC) 15 MG tablet Take 1 tablet (15 mg total) by mouth daily as needed for pain. 90 tablet 3   mupirocin cream (BACTROBAN) 2 % Apply 1 application topically 2 (two) times daily. 15 g 0   omeprazole (PRILOSEC) 20 MG capsule Take 1 capsule (20 mg total) by mouth daily as needed (for heartburn). 30 capsule 4   Study - HPTN 083 (Step 4, open-label) - cabotegravir 600 mg/3 mL intramuscular injection (PI-[place]) Inject 3 mLs (600 mg total) into the muscle every 8 (eight) weeks.     tecovirimat (TPOXX) capsule Take 3 capsules (600 mg total) by mouth 2 (two) times daily with a meal for 14 days. 84 capsule 0  No facility-administered medications prior to visit.     Past Medical History:  Diagnosis Date   Allergic rhinitis, cause unspecified 10/26/2010   Allergy    DDD (degenerative disc disease), cervical 10/26/2010   DDD (degenerative disc disease), lumbar 10/26/2010   Depression    Generalized headaches    GERD (gastroesophageal reflux disease)    HTN (hypertension) 10/26/2010   Hypertension    Kidney stones    Panic attack      Past Surgical History:  Procedure Laterality Date   MOUTH SURGERY  at 65 yo after horse accident       Review of Systems    Objective:        Assessment & Plan:  Spoke on the telephone with  patient for 10 minutes HMPX-Continue TPOXX treatment and take as directed to completion.  Continue to isolate at home and disinfect surfaces and linens once all lesions have new skin growth.  Follow up in 1 week in person.  Reviewed labs-HIV negative, negative for syphilis and GC/chlamydia Problem List Items Addressed This Visit       Other   Human monkeypox - Primary     I am having Mason Morales maintain his aspirin, omeprazole, acetaminophen, meloxicam, dicyclomine, Lactulose, (HPTN 083 (Step 4, open-label) cabotegravir), amLODipine, citalopram, Tpoxx, doxycycline, and mupirocin cream.   No orders of the defined types were placed in this encounter.    I discussed the assessment and treatment plan with the patient. The patient was provided an opportunity to ask questions and all were answered. The patient agreed with the plan and demonstrated an understanding of the instructions.   The patient was advised to call back or seek an in-person evaluation if the symptoms worsen or if the condition fails to improve as anticipated.   I provided   minutes of non-face-to-face time during this encounter.  Follow-up: No follow-ups on file.   Arvilla Meres [facility] Main number: [phone]

## 2021-01-11 ENCOUNTER — Ambulatory Visit (INDEPENDENT_AMBULATORY_CARE_PROVIDER_SITE_OTHER): Payer: Self-pay | Admitting: Physician Assistant

## 2021-01-11 ENCOUNTER — Other Ambulatory Visit: Payer: Self-pay

## 2021-01-11 ENCOUNTER — Encounter: Payer: Self-pay | Admitting: Physician Assistant

## 2021-01-11 VITALS — Wt 158.6 lb

## 2021-01-11 DIAGNOSIS — B04 Monkeypox: Secondary | ICD-10-CM

## 2021-01-11 NOTE — Patient Instructions (Signed)
Continue to isolate until lesions are flush to skin and new skin growth is present Sterilize linens and disinfect surfaces If you must make a supply run wear tight fitting mask and cover lesions Follow up for PrEP appointment Wear condom for 12 week after monkeypox resolution

## 2021-01-11 NOTE — Progress Notes (Signed)
Subjective:    Patient ID: Mason Morales, male    DOB: 10-21-1955, 66 y.o.   MRN: [id number]  Chief Complaint  Patient presents with   Follow-up    HMPX final visit of TPOXX treatment      HPI:  Mason Morales is a 65 y.o. male presents today for follow up on HMPX treatment with TPOXX initiated 12/28/2020.  Onset of symptoms 12/21/20. He reports feeling 100% bettter.  He has 2 scabbed lesions remaining on abdomen and right arm, a few recently fell off and yet to epithelialize. He has remained isolated at home.  He has one day remaining of TPOXX and will send diary to [facility] as instructed on form 3 days after treatment.  He denies adverse events or SAEs, denies any missed doses related to TPOXX. LAD remains bilaterally in inguinal region, secondary infection resolved with doxycycline.  Denies pain, swelling, discharge at lesions.   Allergies  Allergen Reactions   Gadolinium Derivatives Hives   Sulfur Dioxide Nausea And Vomiting   Isovue [Iopamidol] Hives and Itching   Sulfa Antibiotics Nausea And Vomiting      Outpatient Medications Prior to Visit  Medication Sig Dispense Refill   acetaminophen (TYLENOL) 325 MG tablet Take 2 tablets (650 mg total) by mouth every 6 (six) hours as needed. Do not take more than 4000mg  of tylenol per day 30 tablet 0   amLODipine (NORVASC) 10 MG tablet Take 1 tablet (10 mg total) by mouth daily. 90 tablet 3   aspirin 81 MG EC tablet Take 1 tablet (81 mg total) by mouth daily. Swallow whole. 30 tablet 12   citalopram (CELEXA) 20 MG tablet Take 1 tablet (20 mg total) by mouth daily. 90 tablet 3   dicyclomine (BENTYL) 20 MG tablet Take 1 tablet (20 mg total) by mouth 2 (two) times daily. 20 tablet 3   Lactulose 20 GM/30ML SOLN 30 ml by mouth up to three times daily 450 mL 1   meloxicam (MOBIC) 15 MG tablet Take 1 tablet (15 mg total) by mouth daily as needed for pain. 90 tablet 3   mupirocin cream (BACTROBAN) 2 % Apply 1 application topically 2 (two) times  daily. 15 g 0   omeprazole (PRILOSEC) 20 MG capsule Take 1 capsule (20 mg total) by mouth daily as needed (for heartburn). 30 capsule 4   Study - HPTN 083 (Step 4, open-label) - cabotegravir 600 mg/3 mL intramuscular injection (PI-[place]) Inject 3 mLs (600 mg total) into the muscle every 8 (eight) weeks.     tecovirimat (TPOXX) capsule Take 3 capsules (600 mg total) by mouth 2 (two) times daily with a meal for 14 days. 84 capsule 0   No facility-administered medications prior to visit.     Past Medical History:  Diagnosis Date   Allergic rhinitis, cause unspecified 10/26/2010   Allergy    DDD (degenerative disc disease), cervical 10/26/2010   DDD (degenerative disc disease), lumbar 10/26/2010   Depression    Generalized headaches    GERD (gastroesophageal reflux disease)    HTN (hypertension) 10/26/2010   Hypertension    Kidney stones    Panic attack      Past Surgical History:  Procedure Laterality Date   MOUTH SURGERY  at 65 yo after horse accident       Review of Systems  Constitutional:  Negative for chills, diaphoresis, fatigue and fever.  HENT:  Negative for mouth sores and sore throat.   Eyes:  Negative for  discharge and visual disturbance.  Respiratory:  Negative for cough and shortness of breath.   Cardiovascular:  Negative for chest pain and palpitations.  Gastrointestinal:  Negative for abdominal pain, anal bleeding, blood in stool, constipation, diarrhea, nausea, rectal pain and vomiting.  Genitourinary:  Positive for genital sores (resolving HMPX lesions along penile shaft, inguinal region, and scrotum). Negative for penile discharge, penile swelling, scrotal swelling, testicular pain and urgency.  Neurological:  Negative for weakness, light-headedness and headaches.  Hematological:  Positive for adenopathy (bilateral inguinal region).  Psychiatric/Behavioral: Negative.       Objective:    Wt 158 lb 9.6 oz (71.9 kg)   BMI 23.42 kg/m  Nursing note and  vital signs reviewed.  Physical Exam Vitals reviewed.  Constitutional:      Appearance: Normal appearance. He is normal weight.  HENT:     Mouth/Throat:     Mouth: Mucous membranes are moist.     Pharynx: Oropharynx is clear. No oropharyngeal exudate or posterior oropharyngeal erythema.  Eyes:     Extraocular Movements: Extraocular movements intact.     Conjunctiva/sclera: Conjunctivae normal.     Pupils: Pupils are equal, round, and reactive to light.  Skin:    Findings: Lesion (resolving MPX lesions along extremities, abdomen, genitals.  Scabbing and epithelization in process, no signs of secondary infection.  NTTP, no erythema, edema, induration, d/c) present.  Neurological:     General: No focal deficit present.     Mental Status: He is alert and oriented to person, place, and time.  Psychiatric:        Mood and Affect: Mood normal.        Behavior: Behavior normal.     Depression screen [facility] 2/9 11/30/2020 04/02/2018 03/26/2017 12/30/2015 11/18/2014  Decreased Interest 3 0 0 0 0  Down, Depressed, Hopeless 3 0 0 0 0  PHQ - 2 Score 6 0 0 0 0  Altered sleeping 3 - 0 - -  Tired, decreased energy 3 - 0 - -  Change in appetite 0 - 0 - -  Feeling bad or failure about yourself  0 - 0 - -  Trouble concentrating 1 - 0 - -  Moving slowly or fidgety/restless 0 - 0 - -  Suicidal thoughts 0 - 0 - -  PHQ-9 Score 13 - 0 - -  Difficult doing work/chores Very difficult - - - -       Assessment & Plan:   Complete TPOXX-send diary to [facility] Needs more time to heal, lesions need to be completely epithelialized to return to community Advised once lesions resolve will need to use condom for 12 weeks after resolution Follow up for PrEP treatment as instructed Sterilize linens and surfaces once all lesions resolve 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   Chronic constipation 07/22/2019   Left lumbar  radiculopathy 03/26/2017   Acute sinus infection 12/14/2016   Pelvic pain 08/24/2016   Left cervical radiculopathy 05/20/2015   Anxiety state 05/20/2015   GERD (gastroesophageal reflux disease) 10/26/2010   Allergic rhinitis 10/26/2010   HTN (hypertension) 10/26/2010   Kidney stones 10/26/2010   DDD (degenerative disc disease), cervical 10/26/2010   DDD (degenerative disc disease), lumbar 10/26/2010   Left shoulder pain 10/26/2010   Preventative health care 10/26/2010     Problem List Items Addressed This Visit       Other   Human monkeypox -  Primary     I am having Anup A. Bedoy maintain his aspirin, omeprazole, acetaminophen, meloxicam, dicyclomine, Lactulose, (HPTN 083 (Step 4, open-label) cabotegravir), amLODipine, citalopram, Tpoxx, and mupirocin cream.   No orders of the defined types were placed in this encounter.    Follow-up: Return if symptoms worsen or fail to improve.

## 2021-01-26 ENCOUNTER — Telehealth: Payer: Self-pay

## 2021-01-26 NOTE — Telephone Encounter (Signed)
[facility] Patient Advocate Encounter  Patient's medication (Apretude)have been couriered to [facility] from [facility] and will be administered on patient next office visit on 02/06/21.  Clearance [name] , CPhT Specialty Pharmacy Patient [facility] Phone: [phone] Fax:  [phone]

## 2021-02-06 ENCOUNTER — Ambulatory Visit: Payer: Self-pay | Admitting: Pharmacist

## 2021-02-06 ENCOUNTER — Other Ambulatory Visit: Payer: Self-pay

## 2021-02-06 ENCOUNTER — Telehealth: Payer: Self-pay

## 2021-02-06 NOTE — Telephone Encounter (Signed)
Left patient a voice mail to call back to reschedule lab and pharmacy visit after a voice mail we received to reschedule appointments

## 2021-02-13 ENCOUNTER — Other Ambulatory Visit: Payer: Self-pay

## 2021-02-13 ENCOUNTER — Ambulatory Visit (INDEPENDENT_AMBULATORY_CARE_PROVIDER_SITE_OTHER): Payer: Self-pay | Admitting: Pharmacist

## 2021-02-13 DIAGNOSIS — Z79899 Other long term (current) drug therapy: Secondary | ICD-10-CM

## 2021-02-13 DIAGNOSIS — Z113 Encounter for screening for infections with a predominantly sexual mode of transmission: Secondary | ICD-10-CM

## 2021-02-13 MED ORDER — CABOTEGRAVIR ER 600 MG/3ML IM SUER
600.0000 mg | Freq: Once | INTRAMUSCULAR | Status: AC
Start: 2021-02-13 — End: 2021-02-13
  Administered 2021-02-13: 600 mg via INTRAMUSCULAR

## 2021-02-13 NOTE — Progress Notes (Signed)
HPI: Mason Morales is a 65 y.o. male who presents to the [facility] pharmacy clinic for Apretude administration and HIV PrEP follow up.  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   Chronic constipation 07/22/2019   Left lumbar radiculopathy 03/26/2017   Acute sinus infection 12/14/2016   Pelvic pain 08/24/2016   Left cervical radiculopathy 05/20/2015   Anxiety state 05/20/2015   GERD (gastroesophageal reflux disease) 10/26/2010   Allergic rhinitis 10/26/2010   HTN (hypertension) 10/26/2010   Kidney stones 10/26/2010   DDD (degenerative disc disease), cervical 10/26/2010   DDD (degenerative disc disease), lumbar 10/26/2010   Left shoulder pain 10/26/2010   Preventative health care 10/26/2010    Patient's Medications  New Prescriptions   No medications on file  Previous Medications   ACETAMINOPHEN (TYLENOL) 325 MG TABLET    Take 2 tablets (650 mg total) by mouth every 6 (six) hours as needed. Do not take more than 4000mg  of tylenol per day   AMLODIPINE (NORVASC) 10 MG TABLET    Take 1 tablet (10 mg total) by mouth daily.   ASPIRIN 81 MG EC TABLET    Take 1 tablet (81 mg total) by mouth daily. Swallow whole.   CITALOPRAM (CELEXA) 20 MG TABLET    Take 1 tablet (20 mg total) by mouth daily.   DICYCLOMINE (BENTYL) 20 MG TABLET    Take 1 tablet (20 mg total) by mouth 2 (two) times daily.   LACTULOSE 20 GM/30ML SOLN    30 ml by mouth up to three times daily   MELOXICAM (MOBIC) 15 MG TABLET    Take 1 tablet (15 mg total) by mouth daily as needed for pain.   MUPIROCIN CREAM (BACTROBAN) 2 %    Apply 1 application topically 2 (two) times daily.   OMEPRAZOLE (PRILOSEC) 20 MG CAPSULE    Take 1 capsule (20 mg total) by mouth daily as needed (for heartburn).   STUDY - HPTN 083 (STEP 4, OPEN-LABEL) - CABOTEGRAVIR 600 MG/3 ML INTRAMUSCULAR INJECTION (PI-VAN DAM)    Inject 3 mLs (600 mg total) into the muscle  every 8 (eight) weeks.  Modified Medications   No medications on file  Discontinued Medications   No medications on file    Allergies: Allergies  Allergen Reactions   Gadolinium Derivatives Hives   Sulfur Dioxide Nausea And Vomiting   Isovue [Iopamidol] Hives and Itching   Sulfa Antibiotics Nausea And Vomiting    Past Medical History: Past Medical History:  Diagnosis Date   Allergic rhinitis, cause unspecified 10/26/2010   Allergy    DDD (degenerative disc disease), cervical 10/26/2010   DDD (degenerative disc disease), lumbar 10/26/2010   Depression    Generalized headaches    GERD (gastroesophageal reflux disease)    HTN (hypertension) 10/26/2010   Hypertension    Kidney stones    Panic attack     Social History: Social History   Socioeconomic History   Marital status: Single    Spouse name: Not on file   Number of children: Not on file   Years of education: 14   Highest education level: Not on file  Occupational History   Occupation: 12/27/2010    Employer: [employer]  Tobacco Use   Smoking status: Former    Types: Cigarettes    Quit date: 03/20/2014    Years since quitting: 6.9   Smokeless tobacco: Never  Vaping  Use   Vaping Use: Never used  Substance and Sexual Activity   Alcohol use: Yes    Alcohol/week: 6.0 standard drinks    Types: 6 Cans of beer per week    Comment: occ   Drug use: Yes    Types: Marijuana    Comment: THC-daily   Sexual activity: Not on file  Other Topics Concern   Not on file  Social History Narrative   Not on file   Social Determinants of Health   Financial Resource Strain: Not on file  Food Insecurity: Not on file  Transportation Needs: Not on file  Physical Activity: Not on file  Stress: Not on file  Social Connections: Not on file    Labs: Lab Results  Component Value Date   HIV1RNAQUANT Not Detected 12/06/2020   HIV1RNAQUANT Not Detected 10/11/2020   HIV1RNAQUANT Not Detected 08/18/2020    RPR and  STI Lab Results  Component Value Date   LABRPR REACTIVE (A) 12/28/2020   LABRPR NON-REACTIVE 10/11/2020   LABRPR REACTIVE (A) 04/26/2020   LABRPR REACTIVE (A) 04/30/2019   LABRPR REACTIVE (A) 11/13/2018   RPRTITER 1:1 (H) 12/28/2020   RPRTITER 1:2 (H) 04/26/2020   RPRTITER 1:2 (H) 04/30/2019   RPRTITER 1:2 (H) 11/13/2018   RPRTITER 1:2 (H) 05/26/2018    STI Results GC GC CT CT  12/28/2020 Negative - Negative -  12/28/2020 Negative - Negative -  12/28/2020 Negative - Negative -  11/08/2016 - NOT DETECTED - NOT DETECTED    Hepatitis B Lab Results  Component Value Date   HEPBSAB REACTIVE (A) 11/08/2016   HEPBSAG NEGATIVE 10/23/2016   Hepatitis C Lab Results  Component Value Date   HEPCAB NON-REACTIVE 10/11/2020   Hepatitis A No results found for: HAV Lipids: Lab Results  Component Value Date   CHOL 161 11/13/2018   TRIG 62 11/13/2018   HDL 54 11/13/2018   CHOLHDL 3.0 11/13/2018   VLDL 99.8 04/02/2018   LDLCALC 93 11/13/2018    TARGET DATE: 16th of the month  Assessment: Mason Morales presents today for their Apretude injection and to follow up for HIV PrEP. Initial/past injection was tolerated well without issues. No problems with systemic side effects of injection. No signs or symptoms of anything today. Screened patient for acute HIV symptoms such as fatigue, muscle aches, rash, sore throat, lymphadenopathy, headache, night sweats, nausea/vomiting/diarrhea, and fever. Patient denies any symptoms. No new partners since last injection. Counseled that condoms are still advised and encouraged. Rapid HIV antibody blood test was drawn immediately prior to injection and was negative. Patient also had HIV RNA drawn today.   Administered cabotegravir 600mg /4mL in right upper outer quadrant of the gluteal muscle. Monitored patient for 10 minutes after injection. Injection was tolerated well without issue. Last STI screening was 12/28/20 and was negative but did test positive for monkeypox  and completed treatment with Tpoxx. No need for further kidney monitoring with Apretude. Will see him back in 2 months for injection, labs, and HIV PrEP follow up.   Plan: - Apretude injection administered  - Next injection, labs, and PrEP follow up appointment scheduled for 04/10/21 - Call with any issues or questions  04/12/21, PharmD PGY2 Ambulatory Care Pharmacy Resident 02/13/2021 3:45 PM

## 2021-02-15 LAB — URINE CYTOLOGY ANCILLARY ONLY
Chlamydia: NEGATIVE
Comment: NEGATIVE
Comment: NORMAL
Neisseria Gonorrhea: NEGATIVE

## 2021-02-15 LAB — HIV-1 RNA QUANT-NO REFLEX-BLD
HIV 1 RNA Quant: NOT DETECTED Copies/mL
HIV-1 RNA Quant, Log: NOT DETECTED Log cps/mL

## 2021-02-15 LAB — RPR TITER: RPR Titer: 1:2 {titer} — ABNORMAL HIGH

## 2021-02-15 LAB — CYTOLOGY, (ORAL, ANAL, URETHRAL) ANCILLARY ONLY
Chlamydia: NEGATIVE
Comment: NEGATIVE
Comment: NORMAL
Neisseria Gonorrhea: NEGATIVE

## 2021-02-15 LAB — RPR: RPR Ser Ql: REACTIVE — AB

## 2021-02-15 LAB — FLUORESCENT TREPONEMAL AB(FTA)-IGG-BLD: Fluorescent Treponemal ABS: REACTIVE — AB

## 2021-03-31 ENCOUNTER — Telehealth: Payer: Self-pay | Admitting: Pharmacist

## 2021-03-31 NOTE — Telephone Encounter (Signed)
Patient's specialty medication (Apretude) was delivered to clinic and will be administered on 12/19.  Margarite Gouge, PharmD, CPP Clinical Pharmacist Practitioner Infectious Diseases Clinical Pharmacist [facility]

## 2021-04-10 ENCOUNTER — Other Ambulatory Visit: Payer: Self-pay

## 2021-04-10 ENCOUNTER — Telehealth: Payer: Self-pay

## 2021-04-10 ENCOUNTER — Ambulatory Visit: Payer: Self-pay | Admitting: Pharmacist

## 2021-04-10 NOTE — Telephone Encounter (Signed)
Called patient to get missed appointment rescheduled, per pharmacy team patient needs to be seen this week. Left patient a voicemail to call us back to get rescheduled.

## 2021-04-20 ENCOUNTER — Other Ambulatory Visit: Payer: Self-pay

## 2021-04-20 ENCOUNTER — Encounter (HOSPITAL_COMMUNITY): Payer: Self-pay | Admitting: Emergency Medicine

## 2021-04-20 ENCOUNTER — Emergency Department (HOSPITAL_COMMUNITY): Payer: Self-pay

## 2021-04-20 ENCOUNTER — Emergency Department (HOSPITAL_COMMUNITY)
Admission: EM | Admit: 2021-04-20 | Discharge: 2021-04-20 | Disposition: A | Discharge status: Home / Self Care | Payer: Self-pay | Attending: Emergency Medicine | Admitting: Emergency Medicine

## 2021-04-20 DIAGNOSIS — Z79899 Other long term (current) drug therapy: Secondary | ICD-10-CM | POA: Insufficient documentation

## 2021-04-20 DIAGNOSIS — Z7982 Long term (current) use of aspirin: Secondary | ICD-10-CM | POA: Insufficient documentation

## 2021-04-20 DIAGNOSIS — Z87891 Personal history of nicotine dependence: Secondary | ICD-10-CM | POA: Insufficient documentation

## 2021-04-20 DIAGNOSIS — R102 Pelvic and perineal pain: Secondary | ICD-10-CM | POA: Insufficient documentation

## 2021-04-20 DIAGNOSIS — R519 Headache, unspecified: Secondary | ICD-10-CM

## 2021-04-20 DIAGNOSIS — I10 Essential (primary) hypertension: Secondary | ICD-10-CM | POA: Insufficient documentation

## 2021-04-20 DIAGNOSIS — H5711 Ocular pain, right eye: Secondary | ICD-10-CM | POA: Insufficient documentation

## 2021-04-20 LAB — BASIC METABOLIC PANEL
Anion gap: 6 (ref 5–15)
BUN: 11 mg/dL (ref 8–23)
CO2: 25 mmol/L (ref 22–32)
Calcium: 9 mg/dL (ref 8.9–10.3)
Chloride: 106 mmol/L (ref 98–111)
Creatinine, Ser: 1.27 mg/dL — ABNORMAL HIGH (ref 0.61–1.24)
GFR, Estimated: >60 mL/min (ref >60)
Glucose, Bld: 94 mg/dL (ref 70–99)
Potassium: 3.9 mmol/L (ref 3.5–5.1)
Sodium: 137 mmol/L (ref 135–145)

## 2021-04-20 LAB — CBC WITH DIFFERENTIAL/PLATELET
Abs Immature Granulocytes: 0.02 10*3/uL (ref 0.00–0.07)
Basophils Absolute: 0 10*3/uL (ref 0.0–0.1)
Basophils Relative: 0 %
Eosinophils Absolute: 0 10*3/uL (ref 0.0–0.5)
Eosinophils Relative: 0 %
HCT: 45.4 % (ref 39.0–52.0)
Hemoglobin: 14.9 g/dL (ref 13.0–17.0)
Immature Granulocytes: 0 %
Lymphocytes Relative: 28 %
Lymphs Abs: 1.8 10*3/uL (ref 0.7–4.0)
MCH: 29.5 pg (ref 26.0–34.0)
MCHC: 32.8 g/dL (ref 30.0–36.0)
MCV: 89.9 fL (ref 80.0–100.0)
Monocytes Absolute: 0.4 10*3/uL (ref 0.1–1.0)
Monocytes Relative: 6 %
Neutro Abs: 4.3 10*3/uL (ref 1.7–7.7)
Neutrophils Relative %: 66 %
Platelets: 214 10*3/uL (ref 150–400)
RBC: 5.05 MIL/uL (ref 4.22–5.81)
RDW: 13 % (ref 11.5–15.5)
WBC: 6.6 10*3/uL (ref 4.0–10.5)
nRBC: 0 % (ref 0.0–0.2)

## 2021-04-20 LAB — PROTIME-INR
INR: 1 (ref 0.8–1.2)
Prothrombin Time: 12.8 seconds (ref 11.4–15.2)

## 2021-04-20 LAB — APTT: aPTT: 28 seconds (ref 24–36)

## 2021-04-20 MED ORDER — ACETAMINOPHEN 325 MG PO TABS
650.0000 mg | ORAL_TABLET | Freq: Once | ORAL | Status: AC
  Administered 2021-04-20: 21:00:00 650 mg via ORAL
  Filled 2021-04-20: qty 2

## 2021-04-20 MED ORDER — AMLODIPINE BESYLATE 5 MG PO TABS
10.0000 mg | ORAL_TABLET | Freq: Once | ORAL | Status: AC
  Administered 2021-04-20: 21:00:00 10 mg via ORAL
  Filled 2021-04-20: qty 2

## 2021-04-20 NOTE — Discharge Instructions (Addendum)
Please read and follow all provided instructions.  Your diagnoses today include:  1. Acute nonintractable headache, unspecified headache type   2. Primary hypertension   3. Pain of right eye     Tests performed today include: CT of your head which was normal and did not show any serious cause of your headache Complete blood cell count: no problems Basic metabolic panel: no problems EKG abnormal but no significant change from previous Vital signs. See below for your results today.   Medications:  None  Please take your blood pressure medication at home as prescribed.  Take any prescribed medications only as directed.  Additional information:  Follow any educational materials contained in this packet.  You are having a headache. No specific cause was found today for your headache. It may have been a migraine or other cause of headache. Stress, anxiety, fatigue, and depression are common triggers for headaches.   Your headache today does not appear to be life-threatening or require hospitalization, but often the exact cause of headaches is not determined in the emergency department. Therefore, follow-up with your doctor is very important to find out what may have caused your headache and whether or not you need any further diagnostic testing or treatment.   Sometimes headaches can appear benign (not harmful), but then more serious symptoms can develop which should prompt an immediate re-evaluation by your doctor or the emergency department.  BE VERY CAREFUL not to take multiple medicines containing Tylenol (also called acetaminophen). Doing so can lead to an overdose which can damage your liver and cause liver failure and possibly death.   Follow-up instructions: Please follow-up with your primary care provider in the next 3 days for further evaluation of your symptoms.   Return instructions:  Please return to the Emergency Department if you experience worsening symptoms. Return if  the medications do not resolve your headache, if it recurs, or if you have multiple episodes of vomiting or cannot keep down fluids. Return if you have a change from the usual headache. RETURN IMMEDIATELY IF you: Develop a sudden, severe headache Develop confusion or become poorly responsive or faint Develop a fever above 100.87F or problem breathing Have a change in speech, vision, swallowing, or understanding Develop new weakness, numbness, tingling, incoordination in your arms or legs Have a seizure Please return if you have any other emergent concerns.  Additional Information:  Your vital signs today were: BP (!) 206/104 (BP Location: Right Arm)    Pulse 66    Temp 99.2 F (37.3 C) (Oral)    Resp 15    SpO2 100%  If your blood pressure (BP) was elevated above 135/85 this visit, please have this repeated by your doctor within one month. --------------

## 2021-04-20 NOTE — ED Notes (Signed)
Pt's BP 206/104. PA Geiple made aware.

## 2021-04-20 NOTE — ED Triage Notes (Signed)
Pt reports anxiety, headache, and nausea x 2 hours.  No neuro deficits noted.  Reports throbbing pain to R eye.  No blurred vision.  Hypertensive.

## 2021-04-20 NOTE — ED Provider Notes (Signed)
Emergency Medicine Provider Triage Evaluation Note  Mason Morales , a 65 y.o. male  was evaluated in triage.  Pt complains of gradual onset right temporal and occipital HA x 2 days. Notes that this feels different from prior headaches in the severity. Associated nausea, vomiting. Tried ibuprofen with mild relief of his symptoms. Denies dizziness, lightheadedness, vision changes, fever, chills.  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  His last appointment was in December 2021.  Denies anticoagulant use.  Review of Systems  Positive: Headache Negative: Dizziness, lightheadedness  Physical Exam  BP (!) 213/116 (BP Location: Right Arm)    Pulse 77    Temp 99.2 F (37.3 C) (Oral)    Resp 18    SpO2 100%  Gen:   Awake, no distress   Resp:  Normal effort  MSK:   Moves extremities without difficulty  Other:  No focal neurodeficits on exam.  Strength and sensation intact to bilateral upper and lower extremities.  Medical Decision Making  Medically screening exam initiated at 3:00 PM.  Appropriate orders placed.  Zebulon A Bollard was informed that the remainder of the evaluation will be completed by another provider, this initial triage assessment does not replace that evaluation, and the importance of remaining in the ED until their evaluation is complete.   Horton Ellithorpe A, PA-C 04/20/21 1510    Terald Sleeper, MD 04/20/21 [phone]

## 2021-04-20 NOTE — ED Provider Notes (Signed)
[facility] Provider Note   CSN: [id number] Arrival date & time: 04/20/21  1357     History Chief Complaint  Patient presents with   Anxiety   Headache    Towner is a 65 y.o. male.  Patient with history of hypertension, cervical disc disease presents the emergency department for evaluation of headache.  Patient states he had gradual onset of headache starting this morning.  Patient developed pain that was localized behind his right eye.  No vision changes or vision loss.  Reports nausea without vomiting.  Patient denies signs of stroke including: facial droop, slurred speech, aphasia, weakness/numbness in extremities, imbalance/trouble walking.  He has not taken his blood pressure medication today.  Headache is different than headaches that he has had before.  Pain radiates into his neck but states that neck pain feels similar to pain he gets with his radiculopathy.  Since being in the emergency department he reports feeling "much better".  He reports increasing anxiety due to feeling poorly.  Feels as though current symptoms have triggered some panic attacks.  No injuries or trauma reported.      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   Chronic constipation 07/22/2019   Left lumbar radiculopathy 03/26/2017   Acute sinus infection 12/14/2016   Pelvic pain 08/24/2016   Left cervical radiculopathy 05/20/2015   Anxiety state 05/20/2015   GERD (gastroesophageal reflux  disease) 10/26/2010   Allergic rhinitis 10/26/2010   HTN (hypertension) 10/26/2010   Kidney stones 10/26/2010   DDD (degenerative disc disease), cervical 10/26/2010   DDD (degenerative disc disease), lumbar 10/26/2010   Left shoulder pain 10/26/2010   Preventative health care 10/26/2010    Past Surgical History:  Procedure Laterality Date   MOUTH SURGERY  at 65 yo after horse accident       Family History  Problem Relation Age of Onset   Hyperlipidemia Other    Heart disease Other    Stroke Other    Hypertension Other    Diabetes Other    Cancer Other        prostate cancer   Diabetes Other    Alcohol abuse Other    Cancer Other        breast cancer   Stroke Other    Colon cancer Neg Hx    Esophageal cancer Neg Hx    Pancreatic cancer Neg Hx    Rectal cancer Neg Hx    Stomach cancer Neg Hx     Social History   Tobacco Use   Smoking status: Former    Types: Cigarettes    Quit date: 03/20/2014    Years since quitting: 7.0   Smokeless tobacco: Never  Vaping Use   Vaping Use: Never used  Substance Use Topics   Alcohol use: Yes    Alcohol/week: 6.0 standard drinks    Types: 6 Cans of beer per week    Comment: occ   Drug use: Yes  Types: Marijuana    Comment: THC-daily    Home Medications Prior to Admission medications   Medication Sig Start Date End Date Taking? Authorizing Provider  acetaminophen (TYLENOL) 325 MG tablet Take 2 tablets (650 mg total) by mouth every 6 (six) hours as needed. Do not take more than 4000mg  of tylenol per day 08/07/17   Couture, Cortni S, PA-C  amLODipine (NORVASC) 10 MG tablet Take 1 tablet (10 mg total) by mouth daily. 11/30/20   01/30/21, MD  aspirin 81 MG EC tablet Take 1 tablet (81 mg total) by mouth daily. Swallow whole. 03/18/12   03/20/12, MD  citalopram (CELEXA) 20 MG tablet Take 1 tablet (20 mg total) by mouth daily. 11/30/20   01/30/21, MD  dicyclomine (BENTYL) 20 MG tablet Take 1 tablet (20 mg total) by  mouth 2 (two) times daily. 04/02/18   14/11/19, MD  Lactulose 20 GM/30ML SOLN 30 ml by mouth up to three times daily 07/22/19   07/24/19, MD  meloxicam (MOBIC) 15 MG tablet Take 1 tablet (15 mg total) by mouth daily as needed for pain. 04/02/18   14/11/19, MD  mupirocin cream (BACTROBAN) 2 % Apply 1 application topically 2 (two) times daily. 12/28/20   02/27/21, PA-C  omeprazole (PRILOSEC) 20 MG capsule Take 1 capsule (20 mg total) by mouth daily as needed (for heartburn). 08/16/16   08/18/16, MD    Allergies    Gadolinium derivatives, Sulfur dioxide, Isovue [iopamidol], and Sulfa antibiotics  Review of Systems   Review of Systems  Constitutional:  Negative for fever.  HENT:  Negative for congestion, dental problem, rhinorrhea and sinus pressure.   Eyes:  Positive for photophobia. Negative for discharge, redness and visual disturbance.  Respiratory:  Negative for shortness of breath.   Cardiovascular:  Negative for chest pain.  Gastrointestinal:  Positive for nausea. Negative for vomiting.  Musculoskeletal:  Positive for neck pain (chronic). Negative for gait problem and neck stiffness.  Skin:  Negative for rash.  Neurological:  Positive for headaches. Negative for syncope, speech difficulty, weakness, light-headedness and numbness.  Psychiatric/Behavioral:  Negative for confusion.    Physical Exam Updated Vital Signs BP (!) 206/104 (BP Location: Right Arm)    Pulse 66    Temp 99.2 F (37.3 C) (Oral)    Resp 15    SpO2 100%   Physical Exam Vitals and nursing note reviewed.  Constitutional:      Appearance: He is well-developed.  HENT:     Head: Normocephalic and atraumatic.     Right Ear: Tympanic membrane, ear canal and external ear normal.     Left Ear: Tympanic membrane, ear canal and external ear normal.     Nose: Nose normal.     Mouth/Throat:     Pharynx: Uvula midline.  Eyes:     General: Lids are normal.     Intraocular pressure: Right eye  pressure is 13 mmHg. Left eye pressure is 12 mmHg. Measurements were taken using a handheld tonometer.    Conjunctiva/sclera: Conjunctivae normal.     Pupils: Pupils are equal, round, and reactive to light.     Comments: EOMI. Pupils reactive and round.  No tenderness over the temporal artery.  Cardiovascular:     Rate and Rhythm: Normal rate and regular rhythm.  Pulmonary:     Effort: Pulmonary effort is normal.     Breath sounds: Normal breath sounds.  Abdominal:  Palpations: Abdomen is soft.     Tenderness: There is no abdominal tenderness.  Musculoskeletal:        General: Normal range of motion.     Cervical back: Normal range of motion and neck supple. No tenderness or bony tenderness.  Skin:    General: Skin is warm and dry.  Neurological:     Mental Status: He is alert and oriented to person, place, and time.     GCS: GCS eye subscore is 4. GCS verbal subscore is 5. GCS motor subscore is 6.     Cranial Nerves: No cranial nerve deficit.     Sensory: No sensory deficit.     Motor: No abnormal muscle tone.     Coordination: Coordination normal.     Gait: Gait normal.     Deep Tendon Reflexes: Reflexes are normal and symmetric.    ED Results / Procedures / Treatments   Labs (all labs ordered are listed, but only abnormal results are displayed) Labs Reviewed  BASIC METABOLIC PANEL - Abnormal; Notable for the following components:      Result Value   Creatinine, Ser 1.27 (*)    All other components within normal limits  CBC WITH DIFFERENTIAL/PLATELET  PROTIME-INR  APTT    ED ECG REPORT   Date: 04/20/2021  Rate: 70  Rhythm: normal sinus rhythm  QRS Axis: normal  Intervals: normal  ST/T Wave abnormalities: nonspecific T wave changes  Conduction Disutrbances:none  Narrative Interpretation:   Old EKG Reviewed: unchanged from 7/22  I have personally reviewed the EKG tracing and agree with the computerized printout as noted.   Radiology CT Head Wo  Contrast  Result Date: 04/20/2021 CLINICAL DATA:  Headache and nausea for 2 hours pain to the right eye. Hypertensive EXAM: CT HEAD WITHOUT CONTRAST TECHNIQUE: Contiguous axial images were obtained from the base of the skull through the vertex without intravenous contrast. COMPARISON:  None. FINDINGS: Brain: No evidence of acute infarction, hemorrhage, hydrocephalus, extra-axial collection or mass lesion/mass effect. Mild cerebral atrophy. 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 Basal ganglia calcifications. Vascular: Moderate intracranial arterial vascular calcifications. Skull: Calvarium appears intact. Sinuses/Orbits: Mucosal thickening and retention cysts in the paranasal sinuses. No acute air-fluid levels. Mastoid air cells are clear. Other: None. IMPRESSION: No acute intracranial abnormalities. Mild chronic atrophy. 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 Electronically Signed   By: Lucienne Capers M.D.   On: 04/20/2021 15:39    Procedures Procedures   Medications Ordered in ED Medications  amLODipine (NORVASC) tablet 10 mg (has no administration in time range)  acetaminophen (TYLENOL) tablet 650 mg (has no administration in time range)    ED Course  I have reviewed the triage vital signs and the nursing notes.  Pertinent labs & imaging results that were available during my care of the patient were reviewed by me and considered in my medical decision making (see chart for details).  Patient seen and examined.  Labs ordered in triage, reviewed.  No concerning findings.  Head CT performed earlier without acute findings.  Patient continues to be hypertensive.  I will give him home amlodipine.  He states that he has blood pressure medications at home to take.  We will give Tylenol as well.  We will check pressure in the eyes just to confirm no signs of glaucoma.   Otherwise, patient can likely be discharged with close PCP follow-up.  Vital signs reviewed and are as follows: BP (!) 206/104 (BP Location: Right Arm)    Pulse 66    Temp 99.2 F (37.3 C) (Oral)    Resp 15    SpO2 100%   Tonometry performed.  Pressures were normal.  Reviewed EKG.  Shows inferolateral T wave inversions, similar to previous.  Patient does not have any reported chest pain or symptoms which would be considered an anginal equivalent tonight.  Currently symptoms are better.  Comfortable with discharge to home.  He states that he does have his blood pressure medicine and will take as prescribed.  Strongly encourage PCP follow-up for recheck.  Patient counseled to return if they have weakness in their arms or legs, slurred speech, trouble walking or talking, confusion, trouble with their balance, or if they have any other concerns. Patient verbalizes understanding and agrees with plan.     MDM Rules/Calculators/A&P                          HA: Gradual onset of head pain, focused about the right eye without vision changes.  This is a new headache for the patient and he is 65 years of age.  Patient without other high-risk features of headache including: sudden onset/thunderclap HA, naltered mental status, accompanying seizure, headache with exertion, history of immunocompromise, new neck or shoulder pain, fever, use of anticoagulation, family history of spontaneous SAH, concomitant drug use, toxic exposure.   Patient has a normal complete neurological exam, normal vital signs, normal level of consciousness, no signs of meningismus, is well-appearing/non-toxic appearing, no signs of trauma, no pain over the temporal arteries, normal tonometry.  CT performed without signs of bleeding, acute CVA, or other problem.  Low suspicion for occult bleed as symptoms have been improved and patient without signs of meningismus.  No dangerous or life-threatening conditions suspected or identified by  history, physical exam, and by work-up. No indications for hospitalization identified.   Hypertension: Has not taken medications today.  Possibly contributing to headache.  Patient has medications at home per his report.  Given 10 mg amlodipine in the ED.  Without evidence of endorgan damage and improved symptoms, do not feel that patient requires observation until BP improves.        Final Clinical Impression(s) / ED Diagnoses Final diagnoses:  Primary hypertension  Acute nonintractable headache, unspecified headache type  Pain of right eye    Rx / DC Orders ED Discharge Orders     None        [place] Cater, PA-C 04/20/21 2057    Drenda Freeze, MD 04/21/21 1145

## 2021-05-03 ENCOUNTER — Other Ambulatory Visit: Payer: Self-pay

## 2021-05-03 ENCOUNTER — Ambulatory Visit: Payer: Self-pay | Admitting: Pharmacist

## 2021-05-04 ENCOUNTER — Other Ambulatory Visit: Payer: Self-pay

## 2021-05-04 ENCOUNTER — Ambulatory Visit (INDEPENDENT_AMBULATORY_CARE_PROVIDER_SITE_OTHER): Payer: Self-pay | Admitting: Pharmacist

## 2021-05-04 DIAGNOSIS — Z79899 Other long term (current) drug therapy: Secondary | ICD-10-CM

## 2021-05-04 DIAGNOSIS — A528 Late syphilis, latent: Secondary | ICD-10-CM

## 2021-05-04 MED ORDER — PENICILLIN G BENZATHINE 1200000 UNIT/2ML IM SUSY
1.2000 10*6.[IU] | PREFILLED_SYRINGE | Freq: Once | INTRAMUSCULAR | Status: AC
  Administered 2021-05-04: 1.2 10*6.[IU] via INTRAMUSCULAR

## 2021-05-04 MED ORDER — CABOTEGRAVIR ER 600 MG/3ML IM SUER
600.0000 mg | Freq: Once | INTRAMUSCULAR | Status: AC
  Administered 2021-05-04: 600 mg via INTRAMUSCULAR

## 2021-05-04 NOTE — Progress Notes (Signed)
HPI: Mason Morales is a 66 y.o. male who presents to the [facility] pharmacy clinic for Apretude administration and HIV PrEP follow up.  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   Chronic constipation 07/22/2019   Left lumbar radiculopathy 03/26/2017   Acute sinus infection 12/14/2016   Pelvic pain 08/24/2016   Left cervical radiculopathy 05/20/2015   Anxiety state 05/20/2015   GERD (gastroesophageal reflux disease) 10/26/2010   Allergic rhinitis 10/26/2010   HTN (hypertension) 10/26/2010   Kidney stones 10/26/2010   DDD (degenerative disc disease), cervical 10/26/2010   DDD (degenerative disc disease), lumbar 10/26/2010   Left shoulder pain 10/26/2010   Preventative health care 10/26/2010    Patient's Medications  New Prescriptions   No medications on file  Previous Medications   ACETAMINOPHEN (TYLENOL) 325 MG TABLET    Take 2 tablets (650 mg total) by mouth every 6 (six) hours as needed. Do not take more than 4000mg  of tylenol per day   AMLODIPINE (NORVASC) 10 MG TABLET    Take 1 tablet (10 mg total) by mouth daily.   ASPIRIN 81 MG EC TABLET    Take 1 tablet (81 mg total) by mouth daily. Swallow whole.   CITALOPRAM (CELEXA) 20 MG TABLET    Take 1 tablet (20 mg total) by mouth daily.   DICYCLOMINE (BENTYL) 20 MG TABLET    Take 1 tablet (20 mg total) by mouth 2 (two) times daily.   LACTULOSE 20 GM/30ML SOLN    30 ml by mouth up to three times daily   MELOXICAM (MOBIC) 15 MG TABLET    Take 1 tablet (15 mg total) by mouth daily as needed for pain.   MUPIROCIN CREAM (BACTROBAN) 2 %    Apply 1 application topically 2 (two) times daily.   OMEPRAZOLE (PRILOSEC) 20 MG CAPSULE    Take 1 capsule (20 mg total) by mouth daily as needed (for heartburn).  Modified Medications   No medications on file  Discontinued Medications   No medications on file    Allergies: Allergies  Allergen Reactions    Gadolinium Derivatives Hives   Sulfur Dioxide Nausea And Vomiting   Isovue [Iopamidol] Hives and Itching   Sulfa Antibiotics Nausea And Vomiting    Past Medical History: Past Medical History:  Diagnosis Date   Allergic rhinitis, cause unspecified 10/26/2010   Allergy    DDD (degenerative disc disease), cervical 10/26/2010   DDD (degenerative disc disease), lumbar 10/26/2010   Depression    Generalized headaches    GERD (gastroesophageal reflux disease)    HTN (hypertension) 10/26/2010   Hypertension    Kidney stones    Panic attack     Social History: Social History   Socioeconomic History   Marital status: Single    Spouse name: Not on file   Number of children: Not on file   Years of education: 14   Highest education level: Not on file  Occupational History   Occupation: 12/27/2010: [employer]  Tobacco Use   Smoking status: Former    Types: Cigarettes    Quit date: 03/20/2014    Years since quitting: 7.1   Smokeless tobacco: Never  Vaping Use   Vaping Use: Never used  Substance and Sexual Activity   Alcohol use: Yes    Alcohol/week: 6.0 standard drinks    Types: 6 Cans of beer per week  Comment: occ   Drug use: Yes    Types: Marijuana    Comment: THC-daily   Sexual activity: Not on file  Other Topics Concern   Not on file  Social History Narrative   Not on file   Social Determinants of Health   Financial Resource Strain: Not on file  Food Insecurity: Not on file  Transportation Needs: Not on file  Physical Activity: Not on file  Stress: Not on file  Social Connections: Not on file    Labs: Lab Results  Component Value Date   HIV1RNAQUANT Not Detected 02/13/2021   HIV1RNAQUANT Not Detected 12/06/2020   HIV1RNAQUANT Not Detected 10/11/2020    RPR and STI Lab Results  Component Value Date   LABRPR REACTIVE (A) 02/13/2021   LABRPR REACTIVE (A) 12/28/2020   LABRPR NON-REACTIVE 10/11/2020   LABRPR REACTIVE (A) 04/26/2020    LABRPR REACTIVE (A) 04/30/2019   RPRTITER 1:2 (H) 02/13/2021   RPRTITER 1:1 (H) 12/28/2020   RPRTITER 1:2 (H) 04/26/2020   RPRTITER 1:2 (H) 04/30/2019   RPRTITER 1:2 (H) 11/13/2018    STI Results GC GC CT CT  02/13/2021 Negative - Negative -  02/13/2021 Negative - Negative -  12/28/2020 Negative - Negative -  12/28/2020 Negative - Negative -  12/28/2020 Negative - Negative -  11/08/2016 - NOT DETECTED - NOT DETECTED    Hepatitis B Lab Results  Component Value Date   HEPBSAB REACTIVE (A) 11/08/2016   HEPBSAG NEGATIVE 10/23/2016   Hepatitis C Lab Results  Component Value Date   HEPCAB NON-REACTIVE 10/11/2020   Hepatitis A No results found for: HAV Lipids: Lab Results  Component Value Date   CHOL 161 11/13/2018   TRIG 62 11/13/2018   HDL 54 11/13/2018   CHOLHDL 3.0 11/13/2018   VLDL 35.5 04/02/2018   LDLCALC 93 11/13/2018    TARGET DATE: The 16th of the month  Assessment: Elic presents today for their Apretude injection and to follow up for HIV PrEP. No issues with past injections. No known exposures to any STIs and no signs or symptoms of any STIs today. Last STI screening was 02/13/21 and was negative. Screened patient for acute HIV symptoms such as fatigue, muscle aches, rash, sore throat, lymphadenopathy, headache, night sweats, nausea/vomiting/diarrhea, and fever. Patient denies any symptoms. No new partners since last injection. HIV antibody blood test was drawn immediately prior to injection and was negative. Patient also had HIV RNA drawn today.   Reviewed syphilis labs, patient has consistently positive treponemal and low RPR titer 1:1 or 1:2 since 2018/2019. Discussed with patient and health department and no recollection of receiving prior treatment. History of doxycyline 100mg  BID x21 day course for prostatitis in 2018, however duration insufficient for syphilis treatment. 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 Will administer penicillin G 2.4 million units IM today and return in one week for 2nd dose, followed by one week for 3rd and last dose.  Administered cabotegravir 600mg /31mL in right upper outer quadrant of the gluteal muscle. Monitored patient for 10 minutes after injection. Injection was tolerated well without issue. Will see him back in 2 months for injection, labs, and HIV PrEP follow up.  Plan: - Apretude injection administered - Penicillin administered - HIV RNA today - Next penicillin administrations scheduled for 05/11/21 and 05/18/21 - Next apretude injection, labs, and PrEP follow up appointment scheduled for 07/04/21 - Call with any issues or questions  05/20/21) Rumaisa Schnetzer, PharmD Student

## 2021-05-06 LAB — HIV-1 RNA QUANT-NO REFLEX-BLD
HIV 1 RNA Quant: 246 Copies/mL — ABNORMAL HIGH
HIV-1 RNA Quant, Log: 2.39 Log cps/mL — ABNORMAL HIGH

## 2021-05-09 ENCOUNTER — Other Ambulatory Visit: Payer: Self-pay

## 2021-05-09 ENCOUNTER — Encounter: Payer: Self-pay | Admitting: Internal Medicine

## 2021-05-09 ENCOUNTER — Ambulatory Visit: Payer: Self-pay

## 2021-05-09 ENCOUNTER — Ambulatory Visit (INDEPENDENT_AMBULATORY_CARE_PROVIDER_SITE_OTHER): Payer: Self-pay | Admitting: Pharmacist

## 2021-05-09 ENCOUNTER — Other Ambulatory Visit: Payer: Self-pay | Admitting: Pharmacist

## 2021-05-09 DIAGNOSIS — Z113 Encounter for screening for infections with a predominantly sexual mode of transmission: Secondary | ICD-10-CM

## 2021-05-09 DIAGNOSIS — B20 Human immunodeficiency virus [HIV] disease: Secondary | ICD-10-CM

## 2021-05-09 DIAGNOSIS — Z79899 Other long term (current) drug therapy: Secondary | ICD-10-CM

## 2021-05-09 MED ORDER — SYMTUZA 800-150-200-10 MG PO TABS: 1.0000 | ORAL_TABLET | Freq: Every day | ORAL | 0 refills | Status: DC

## 2021-05-09 NOTE — Progress Notes (Signed)
05/09/2021  HPI: Mason Morales is a 66 y.o. male who presents to the [facility] pharmacy clinic for rapid start for his newly diagnosed HIV infection.  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   Chronic constipation 07/22/2019   Left lumbar radiculopathy 03/26/2017   Acute sinus infection 12/14/2016   Pelvic pain 08/24/2016   Left cervical radiculopathy 05/20/2015   Anxiety state 05/20/2015   GERD (gastroesophageal reflux disease) 10/26/2010   Allergic rhinitis 10/26/2010   HTN (hypertension) 10/26/2010   Kidney stones 10/26/2010   DDD (degenerative disc disease), cervical 10/26/2010   DDD (degenerative disc disease), lumbar 10/26/2010   Left shoulder pain 10/26/2010   Preventative health care 10/26/2010    Patient's Medications  New Prescriptions   No medications on file  Previous Medications   ACETAMINOPHEN (TYLENOL) 325 MG TABLET    Take 2 tablets (650 mg total) by mouth every 6 (six) hours as needed. Do not take more than 4000mg  of tylenol per day   AMLODIPINE (NORVASC) 10 MG TABLET    Take 1 tablet (10 mg total) by mouth daily.   ASPIRIN 81 MG EC TABLET    Take 1 tablet (81 mg total) by mouth daily. Swallow whole.   CITALOPRAM (CELEXA) 20 MG TABLET    Take 1 tablet (20 mg total) by mouth daily.   DICYCLOMINE (BENTYL) 20 MG TABLET    Take 1 tablet (20 mg total) by mouth 2 (two) times daily.   LACTULOSE 20 GM/30ML SOLN    30 ml by mouth up to three times daily   MELOXICAM (MOBIC) 15 MG TABLET    Take 1 tablet (15 mg total) by mouth daily as needed for pain.   MUPIROCIN CREAM (BACTROBAN) 2 %    Apply 1 application topically 2 (two) times daily.   OMEPRAZOLE (PRILOSEC) 20 MG CAPSULE    Take 1 capsule (20 mg total) by mouth daily as needed (for heartburn).  Modified Medications   No medications on file  Discontinued Medications   No medications on file    Allergies: Allergies   Allergen Reactions   Gadolinium Derivatives Hives   Sulfur Dioxide Nausea And Vomiting   Isovue [Iopamidol] Hives and Itching   Sulfa Antibiotics Nausea And Vomiting    Past Medical History: Past Medical History:  Diagnosis Date   Allergic rhinitis, cause unspecified 10/26/2010   Allergy    DDD (degenerative disc disease), cervical 10/26/2010   DDD (degenerative disc disease), lumbar 10/26/2010   Depression    Generalized headaches    GERD (gastroesophageal reflux disease)    HTN (hypertension) 10/26/2010   Hypertension    Kidney stones    Panic attack     Social History: Social History   Socioeconomic History   Marital status: Single    Spouse name: Not on file   Number of children: Not on file   Years of education: 14   Highest education level: Not on file  Occupational History   Occupation: Best boy: [employer]  Tobacco Use   Smoking status: Former    Types: Cigarettes    Quit date: 03/20/2014    Years since quitting: 7.1   Smokeless tobacco: Never  Vaping Use   Vaping Use: Never used  Substance and Sexual Activity   Alcohol use: Yes    Alcohol/week: 6.0 standard drinks    Types: 6 Cans of  beer per week    Comment: occ   Drug use: Yes    Types: Marijuana    Comment: THC-daily   Sexual activity: Not on file  Other Topics Concern   Not on file  Social History Narrative   Not on file   Social Determinants of Health   Financial Resource Strain: Not on file  Food Insecurity: Not on file  Transportation Needs: Not on file  Physical Activity: Not on file  Stress: Not on file  Social Connections: Not on file    Labs: Lab Results  Component Value Date   HIV1RNAQUANT 246 (H) 05/04/2021   HIV1RNAQUANT Not Detected 02/13/2021   HIV1RNAQUANT Not Detected 12/06/2020    RPR and STI Lab Results  Component Value Date   LABRPR REACTIVE (A) 02/13/2021   LABRPR REACTIVE (A) 12/28/2020   LABRPR NON-REACTIVE 10/11/2020   LABRPR  REACTIVE (A) 04/26/2020   LABRPR REACTIVE (A) 04/30/2019   RPRTITER 1:2 (H) 02/13/2021   RPRTITER 1:1 (H) 12/28/2020   RPRTITER 1:2 (H) 04/26/2020   RPRTITER 1:2 (H) 04/30/2019   RPRTITER 1:2 (H) 11/13/2018    STI Results GC GC CT CT  02/13/2021 Negative - Negative -  02/13/2021 Negative - Negative -  12/28/2020 Negative - Negative -  12/28/2020 Negative - Negative -  12/28/2020 Negative - Negative -  11/08/2016 - NOT DETECTED - NOT DETECTED    Hepatitis B Lab Results  Component Value Date   HEPBSAB REACTIVE (A) 11/08/2016   HEPBSAG NEGATIVE 10/23/2016   Hepatitis C Lab Results  Component Value Date   HEPCAB NON-REACTIVE 10/11/2020   Hepatitis A No results found for: HAV Lipids: Lab Results  Component Value Date   CHOL 161 11/13/2018   TRIG 62 11/13/2018   HDL 54 11/13/2018   CHOLHDL 3.0 11/13/2018   VLDL 27.0 04/02/2018   LDLCALC 93 11/13/2018    Current HIV Regimen: Treatment naive -- previously on Apretude  Assessment: Mason Morales is here today to start treatment for newly diagnosed HIV infection. He was previously seen by our research team on the [employer] 083 study of Cabotegravir vs Truvada for PrEP. He started on this study in 2018 and continued until completion. He ended up receiving active Truvada drug and injectable Cabotegravir placebo during the study period. He then took open-label Truvada after the active period of the study and was then transitioned to version 4.0 and started actual IM cabotegravir injections in June 2021.   He then transitioned to seeing me in the clinic in August 2022 once the study was closed. He received a IM cabotegravir injection on 12/06/20 and 02/13/21 and was on time for each injection prior to his transition to me. He was scheduled for another dose on 04/05/21 but missed this injection. He was unable to be reached to reschedule and ended up coming for his injection on 05/04/21. His target date was the 16th of the month so he did not qualify for a  repeat of his loading doses per the package insert (<1 month from missed target date).   When he came in for his injection on 05/04/21, his rapid HIV antibody test was negative but his RNA resulted positive with a viral load of 246. He states that he has only had one partner since his October appointment (where his HIV RNA was negative/undetectable). No other symptoms although he did present to the hospital on 12/29 for hypertensive emergency with excruciating headache. No nausea, vomiting, diarrhea, lymphadenopathy, etc. He was diagnosed with monkeypox  in September 2022.   Will rapid start patient on [place] today. Explained that Jules Husbands is a one pill once daily medication with food and the importance of not missing any doses. Explained resistance and how it develops and why it is so important to take Symtuza daily and not skip days or doses. Counseled to take it around the same time each day with a meal and the importance of having food on your stomach in order for the medication to be absorbed properly.   Counseled on what to do if dose is missed, if closer to missed dose take immediately, if closer to next dose then skip and resume normal schedule. Cautioned on possible side effects the first week or so including nausea, diarrhea, dizziness, fatigue, rash, and headaches but that they should resolve after the first couple of weeks. I reviewed his medications and found no drug interactions.   Will check all baseline labs today including a genotype with integrase as I am worried he has complete integrase resistance now. I will have him see financial to get signed up for [facility] since he is uninsured. I will have him see Dr. Juleen China in 2-3 weeks as a new B20 patient.  He was a bit overwhelmed today but states that he had a good cry last night, took some of his anxiety medication, and talked to a friend who has been HIV positive for 20 years. He feels more at peace about the diagnosis. I discussed our  counseling services here and he declined an appointment with Arbie Cookey at this time. I advised him that we may have him meet with her briefly at his next appointment with Dr. Juleen China. He will call me with any questions/concerns.  Plan: - Rapid start Symtuza once daily with food - HIV RNA with reflex to genotype plus all  baseline lab work ordered Education administrator with financial to sign up for [employer] - F/u with Dr. Juleen China on 2/2 at 3pm (also has an appointment with me)  Ashe Gago L. Charlsey Moragne, PharmD, BCIDP, AAHIVP, CPP Clinical Pharmacist Practitioner [place] [facility] 05/09/2021, 1:55 PM

## 2021-05-10 ENCOUNTER — Other Ambulatory Visit: Payer: Self-pay

## 2021-05-10 ENCOUNTER — Ambulatory Visit: Payer: Self-pay | Admitting: Pharmacist

## 2021-05-10 LAB — URINE CYTOLOGY ANCILLARY ONLY
Chlamydia: NEGATIVE
Comment: NEGATIVE
Comment: NORMAL
Neisseria Gonorrhea: NEGATIVE

## 2021-05-11 ENCOUNTER — Ambulatory Visit: Payer: Self-pay | Admitting: Pharmacist

## 2021-05-18 ENCOUNTER — Ambulatory Visit: Payer: Self-pay | Admitting: Pharmacist

## 2021-05-18 ENCOUNTER — Other Ambulatory Visit: Payer: Self-pay | Admitting: Pharmacist

## 2021-05-18 DIAGNOSIS — B20 Human immunodeficiency virus [HIV] disease: Secondary | ICD-10-CM

## 2021-05-18 MED ORDER — SYMTUZA 800-150-200-10 MG PO TABS: 1.0000 | ORAL_TABLET | Freq: Every day | ORAL | 0 refills | Status: DC

## 2021-05-18 NOTE — Progress Notes (Signed)
Error - samples already documented °

## 2021-05-19 LAB — HEPATITIS C ANTIBODY
Hepatitis C Ab: NONREACTIVE
SIGNAL TO CUT-OFF: 0.29 (ref <1.00)

## 2021-05-19 LAB — C. TRACHOMATIS/N. GONORRHOEAE RNA
C. trachomatis RNA, TMA: NOT DETECTED
N. gonorrhoeae RNA, TMA: NOT DETECTED

## 2021-05-19 LAB — CBC WITH DIFFERENTIAL/PLATELET
Absolute Monocytes: 302 cells/uL (ref 200–950)
Basophils Absolute: 11 cells/uL (ref 0–200)
Basophils Relative: 0.2 %
Eosinophils Absolute: 21 cells/uL (ref 15–500)
Eosinophils Relative: 0.4 %
HCT: 46.7 % (ref 38.5–50.0)
Hemoglobin: 15.7 g/dL (ref 13.2–17.1)
Lymphs Abs: 1993 cells/uL (ref 850–3900)
MCH: 29.5 pg (ref 27.0–33.0)
MCHC: 33.6 g/dL (ref 32.0–36.0)
MCV: 87.6 fL (ref 80.0–100.0)
MPV: 9.4 fL (ref 7.5–12.5)
Monocytes Relative: 5.7 %
Neutro Abs: 2973 cells/uL (ref 1500–7800)
Neutrophils Relative %: 56.1 %
Platelets: 235 10*3/uL (ref 140–400)
RBC: 5.33 10*6/uL (ref 4.20–5.80)
RDW: 13 % (ref 11.0–15.0)
Total Lymphocyte: 37.6 %
WBC: 5.3 10*3/uL (ref 3.8–10.8)

## 2021-05-19 LAB — HIV-1 INTEGRASE GENOTYPE

## 2021-05-19 LAB — COMPLETE METABOLIC PANEL WITH GFR
AG Ratio: 1.4 (calc) (ref 1.0–2.5)
ALT: 9 U/L (ref 9–46)
AST: 15 U/L (ref 10–35)
Albumin: 4.6 g/dL (ref 3.6–5.1)
Alkaline phosphatase (APISO): 82 U/L (ref 35–144)
BUN: 15 mg/dL (ref 7–25)
CO2: 23 mmol/L (ref 20–32)
Calcium: 10 mg/dL (ref 8.6–10.3)
Chloride: 103 mmol/L (ref 98–110)
Creat: 1.24 mg/dL (ref 0.70–1.35)
Globulin: 3.3 g/dL (calc) (ref 1.9–3.7)
Glucose, Bld: 113 mg/dL — ABNORMAL HIGH (ref 65–99)
Potassium: 4.1 mmol/L (ref 3.5–5.3)
Sodium: 139 mmol/L (ref 135–146)
Total Bilirubin: 0.3 mg/dL (ref 0.2–1.2)
Total Protein: 7.9 g/dL (ref 6.1–8.1)
eGFR: 65 mL/min/{1.73_m2} (ref >=60)

## 2021-05-19 LAB — LIPID PANEL
Cholesterol: 200 mg/dL — ABNORMAL HIGH (ref <200)
HDL: 60 mg/dL (ref >=40)
LDL Cholesterol (Calc): 117 mg/dL (calc) — ABNORMAL HIGH
Non-HDL Cholesterol (Calc): 140 mg/dL (calc) — ABNORMAL HIGH (ref <130)
Total CHOL/HDL Ratio: 3.3 (calc) (ref <5.0)
Triglycerides: 120 mg/dL (ref <150)

## 2021-05-19 LAB — FLUORESCENT TREPONEMAL AB(FTA)-IGG-BLD: Fluorescent Treponemal ABS: REACTIVE — AB

## 2021-05-19 LAB — HEPATITIS A ANTIBODY, TOTAL: Hepatitis A AB,Total: NONREACTIVE

## 2021-05-19 LAB — QUANTIFERON-TB GOLD PLUS
Mitogen-NIL: >10 IU/mL
NIL: 0.07 IU/mL
QuantiFERON-TB Gold Plus: NEGATIVE
TB1-NIL: <0 IU/mL
TB2-NIL: <0 IU/mL

## 2021-05-19 LAB — GC/CHLAMYDIA PROBE, AMP (THROAT)
Chlamydia trachomatis RNA: NOT DETECTED
Neisseria gonorrhoeae RNA: NOT DETECTED

## 2021-05-19 LAB — HIV ANTIBODY (ROUTINE TESTING W REFLEX): HIV 1&2 Ab, 4th Generation: NONREACTIVE

## 2021-05-19 LAB — RPR: RPR Ser Ql: REACTIVE — AB

## 2021-05-19 LAB — HIV-1 GENOTYPE: HIV-1 Genotype: DETECTED — AB

## 2021-05-19 LAB — RPR TITER: RPR Titer: 1:2 {titer} — ABNORMAL HIGH

## 2021-05-19 LAB — HIV RNA, RTPCR W/R GT (RTI, PI,INT)
HIV 1 RNA Quant: 2610 copies/mL — ABNORMAL HIGH
HIV-1 RNA Quant, Log: 3.42 Log copies/mL — ABNORMAL HIGH

## 2021-05-19 LAB — HEPATITIS B CORE ANTIBODY, TOTAL: Hep B Core Total Ab: REACTIVE — AB

## 2021-05-19 LAB — HEPATITIS B SURFACE ANTIBODY,QUALITATIVE: Hep B S Ab: REACTIVE — AB

## 2021-05-19 LAB — HLA B*5701: HLA-B*5701 w/rflx HLA-B High: NEGATIVE

## 2021-05-19 LAB — HEPATITIS B SURFACE ANTIGEN: Hepatitis B Surface Ag: NONREACTIVE

## 2021-05-25 ENCOUNTER — Encounter: Payer: Self-pay | Admitting: Pharmacist

## 2021-05-25 ENCOUNTER — Ambulatory Visit: Payer: Self-pay | Admitting: Pharmacist

## 2021-05-25 ENCOUNTER — Ambulatory Visit: Payer: Self-pay | Admitting: Internal Medicine

## 2021-06-06 ENCOUNTER — Encounter: Payer: Self-pay | Admitting: Infectious Disease

## 2021-06-06 DIAGNOSIS — Z7185 Encounter for immunization safety counseling: Secondary | ICD-10-CM | POA: Insufficient documentation

## 2021-06-06 DIAGNOSIS — B20 Human immunodeficiency virus [HIV] disease: Secondary | ICD-10-CM

## 2021-06-06 HISTORY — DX: Human immunodeficiency virus (HIV) disease: B20

## 2021-06-06 HISTORY — DX: Encounter for immunization safety counseling: Z71.85

## 2021-06-06 NOTE — Progress Notes (Signed)
Subjective:  Chief complaint: Here to establish care for his HIV infection, he is disappointed that he acquired it despite having been on PrEP   Patient ID: Mason Morales, male    DOB: [birth year], 66 y.o.   MRN: [id number]  HPI  Mason Morales is a 66 year old black man who has history of hypertension gastroesophageal reflux disease kidney stones who had been on PrEP via the [employer] study and then more recently via commercially available Apretude that was made possible through the study.  Recent history is pertinent for him having acquired monkeypox infection for which she was treated with TPOXX in September 2022.  Patient had been getting on-time injections of Apretude but in December when his medications were available he failed to show for his appointment,.  Later in December on the 29th he presented the ER with severe headache nausea and neck pain.  He also believes that time he may have been suffering from a panic attack.  Pressure quite elevated the time and he was given amlodipine.  He then came in January for a visit with Cassie Kuppelweiser and HIV rapid test (negative) and was given proceed injection.  A viral load was sent as per guidelines and unfortunately came back positive with a viral load of 246.  He was brought back to the clinic to initiate rapid start to treat HIV.  Viral load that point was 2610.  His HIV antibody was still negative undoubtedly due to the effect of Apretude on his virus.  His HIV genotype was negative for mutations including failure to show integrase strand transfer and better mutations.  He was initiated on [facility] which she has tolerated without side effects.  He is very frustrated by the fact that he is acquired HIV despite having been on preexposure prophylaxis and tells me today that he is telling all of his friends that [place] "doesn't work."  He is also puzzled about who could have given him HIV.  He says he largely has 1 sexual partner who lives in  [place] who is reportedly HIV negative.  I suspect this partner may in fact be positive.    Past Medical History:  Diagnosis Date   Allergic rhinitis, cause unspecified 10/26/2010   Allergy    DDD (degenerative disc disease), cervical 10/26/2010   DDD (degenerative disc disease), lumbar 10/26/2010   Depression    Generalized headaches    GERD (gastroesophageal reflux disease)    HTN (hypertension) 10/26/2010   Hypertension    Kidney stones    Panic attack     Past Surgical History:  Procedure Laterality Date   MOUTH SURGERY  at 66 yo after horse accident    Family History  Problem Relation Age of Onset   Hyperlipidemia Other    Heart disease Other    Stroke Other    Hypertension Other    Diabetes Other    Cancer Other        prostate cancer   Diabetes Other    Alcohol abuse Other    Cancer Other        breast cancer   Stroke Other    Colon cancer Neg Hx    Esophageal cancer Neg Hx    Pancreatic cancer Neg Hx    Rectal cancer Neg Hx    Stomach cancer Neg Hx       Social History   Socioeconomic History   Marital status: Single    Spouse name: Not on file   Number of children:  Not on file   Years of education: 14   Highest education level: Not on file  Occupational History   Occupation: Tourist information centre manager    Employer: [employer]  Tobacco Use   Smoking status: Former    Types: Cigarettes    Quit date: 03/20/2014    Years since quitting: 7.2   Smokeless tobacco: Never  Vaping Use   Vaping Use: Never used  Substance and Sexual Activity   Alcohol use: Yes    Alcohol/week: 6.0 standard drinks    Types: 6 Cans of beer per week    Comment: occ   Drug use: Yes    Types: Marijuana    Comment: THC-daily   Sexual activity: Not on file  Other Topics Concern   Not on file  Social History Narrative   Not on file   Social Determinants of Health   Financial Resource Strain: Not on file  Food Insecurity: Not on file  Transportation Needs: Not on file   Physical Activity: Not on file  Stress: Not on file  Social Connections: Not on file    Allergies  Allergen Reactions   Gadolinium Derivatives Hives   Sulfur Dioxide Nausea And Vomiting   Isovue [Iopamidol] Hives and Itching   Sulfa Antibiotics Nausea And Vomiting     Current Outpatient Medications:    acetaminophen (TYLENOL) 325 MG tablet, Take 2 tablets (650 mg total) by mouth every 6 (six) hours as needed. Do not take more than 4000mg  of tylenol per day, Disp: 30 tablet, Rfl: 0   amLODipine (NORVASC) 10 MG tablet, Take 1 tablet (10 mg total) by mouth daily., Disp: 90 tablet, Rfl: 3   aspirin 81 MG EC tablet, Take 1 tablet (81 mg total) by mouth daily. Swallow whole., Disp: 30 tablet, Rfl: 12   citalopram (CELEXA) 20 MG tablet, Take 1 tablet (20 mg total) by mouth daily., Disp: 90 tablet, Rfl: 3   Darunavir-Cobicistat-Emtricitabine-Tenofovir Alafenamide (SYMTUZA) 800-150-200-10 MG TABS, Take 1 tablet by mouth daily with breakfast., Disp: 30 tablet, Rfl: 0   dicyclomine (BENTYL) 20 MG tablet, Take 1 tablet (20 mg total) by mouth 2 (two) times daily., Disp: 20 tablet, Rfl: 3   Lactulose 20 GM/30ML SOLN, 30 ml by mouth up to three times daily, Disp: 450 mL, Rfl: 1   meloxicam (MOBIC) 15 MG tablet, Take 1 tablet (15 mg total) by mouth daily as needed for pain., Disp: 90 tablet, Rfl: 3   mupirocin cream (BACTROBAN) 2 %, Apply 1 application topically 2 (two) times daily., Disp: 15 g, Rfl: 0   omeprazole (PRILOSEC) 20 MG capsule, Take 1 capsule (20 mg total) by mouth daily as needed (for heartburn)., Disp: 30 capsule, Rfl: 4   Review of Systems  Constitutional:  Negative for activity change, appetite change, chills, diaphoresis, fatigue, fever and unexpected weight change.  HENT:  Negative for congestion, rhinorrhea, sinus pressure, sneezing, sore throat and trouble swallowing.   Eyes:  Negative for photophobia and visual disturbance.  Respiratory:  Negative for cough, chest tightness,  shortness of breath, wheezing and stridor.   Cardiovascular:  Negative for chest pain, palpitations and leg swelling.  Gastrointestinal:  Negative for abdominal distention, abdominal pain, anal bleeding, blood in stool, constipation, diarrhea, nausea and vomiting.  Genitourinary:  Negative for difficulty urinating, dysuria, flank pain and hematuria.  Musculoskeletal:  Negative for arthralgias, back pain, gait problem, joint swelling and myalgias.  Skin:  Negative for color change, pallor, rash and wound.  Neurological:  Negative for dizziness,  tremors, weakness and light-headedness.  Hematological:  Negative for adenopathy. Does not bruise/bleed easily.  Psychiatric/Behavioral:  Positive for dysphoric mood. Negative for agitation, behavioral problems, confusion, decreased concentration and sleep disturbance.       Objective:   Physical Exam Constitutional:      Appearance: He is well-developed.  HENT:     Head: Normocephalic and atraumatic.     Nose: Nose normal.  Eyes:     General:        Right eye: No discharge.        Left eye: No discharge.     Conjunctiva/sclera: Conjunctivae normal.  Cardiovascular:     Rate and Rhythm: Normal rate and regular rhythm.  Pulmonary:     Effort: Pulmonary effort is normal. No respiratory distress.     Breath sounds: No wheezing.  Abdominal:     General: There is no distension.     Palpations: Abdomen is soft.  Musculoskeletal:        General: No tenderness. Normal range of motion.     Cervical back: Normal range of motion and neck supple.  Skin:    General: Skin is warm and dry.     Coloration: Skin is not pale.     Findings: No erythema or rash.  Neurological:     General: No focal deficit present.     Mental Status: He is alert and oriented to person, place, and time.  Psychiatric:        Mood and Affect: Mood normal.        Behavior: Behavior normal.        Thought Content: Thought content normal.        Judgment: Judgment normal.           Assessment & Plan:   HIV disease:  Repeat viral load today with resistance testing on reflex.  I am also ordering an HIV archive genotype via [employer].  We will check a CD4 count which was not checked with initial labs CBC CMP,  Is tolerating SYMTUZA well at present I am continuing prescription for this.    In the future we could always consider other options such as NNRTI based therapy or potentially INSTI B if he truly has not acquired integrates resistance.  Hypertension:  He will continue on Norvasc.  Gastroesophageal reflux disease continue Prilosec  Vaccine counseling: Recommended flu shot as well as Prevnar 20 which were given today.  I spent 62  minutes with the patient including than 50% of the time in face to face counseling of the patient the nature of HIV, PrEP and limits to current available therapy and the fact that patient still can acquire HIV despite PrEP in particular if injections or doses are missed, going over his current regimen potential other regimens,  along with review of medical records in preparation for the visit and during the visit and in coordination of her care.

## 2021-06-07 ENCOUNTER — Other Ambulatory Visit (HOSPITAL_COMMUNITY): Payer: Self-pay

## 2021-06-07 ENCOUNTER — Other Ambulatory Visit: Payer: Self-pay

## 2021-06-07 ENCOUNTER — Other Ambulatory Visit: Payer: Self-pay | Admitting: Infectious Disease

## 2021-06-07 ENCOUNTER — Ambulatory Visit (INDEPENDENT_AMBULATORY_CARE_PROVIDER_SITE_OTHER): Payer: Self-pay

## 2021-06-07 ENCOUNTER — Ambulatory Visit (INDEPENDENT_AMBULATORY_CARE_PROVIDER_SITE_OTHER): Payer: Self-pay | Admitting: Pharmacist

## 2021-06-07 ENCOUNTER — Encounter: Payer: Self-pay | Admitting: Infectious Disease

## 2021-06-07 ENCOUNTER — Ambulatory Visit (INDEPENDENT_AMBULATORY_CARE_PROVIDER_SITE_OTHER): Payer: Self-pay | Admitting: Infectious Disease

## 2021-06-07 VITALS — BP 135/73 | HR 75 | Temp 97.5°F | Wt 171.0 lb

## 2021-06-07 DIAGNOSIS — F12980 Cannabis use, unspecified with anxiety disorder: Secondary | ICD-10-CM

## 2021-06-07 DIAGNOSIS — Z23 Encounter for immunization: Secondary | ICD-10-CM

## 2021-06-07 DIAGNOSIS — B20 Human immunodeficiency virus [HIV] disease: Secondary | ICD-10-CM

## 2021-06-07 DIAGNOSIS — I10 Essential (primary) hypertension: Secondary | ICD-10-CM

## 2021-06-07 DIAGNOSIS — Z7185 Encounter for immunization safety counseling: Secondary | ICD-10-CM

## 2021-06-07 MED ORDER — SYMTUZA 800-150-200-10 MG PO TABS: 1.0000 | ORAL_TABLET | Freq: Every day | ORAL | 11 refills | Status: DC

## 2021-06-07 MED ORDER — SYMTUZA 800-150-200-10 MG PO TABS
1.0000 | ORAL_TABLET | Freq: Every day | ORAL | 11 refills | Status: DC
  Filled 2021-06-07: qty 30, 30d supply, fill #0

## 2021-06-07 NOTE — Progress Notes (Signed)
06/07/2021  HPI: Mason Morales is a 66 y.o. male who presents to the [place] clinic for HIV follow-up.  Patient Active Problem List   Diagnosis Date Noted   HIV disease ([place]) 06/06/2021   Vaccine counseling 06/06/2021   Human monkeypox 12/28/2020   Cannabis use with anxiety disorder ([place]) 12/05/2020   Chronic constipation 07/22/2019   Left lumbar radiculopathy 03/26/2017   Acute sinus infection 12/14/2016   Pelvic pain 08/24/2016   Left cervical radiculopathy 05/20/2015   Anxiety state 05/20/2015   GERD (gastroesophageal reflux disease) 10/26/2010   Allergic rhinitis 10/26/2010   HTN (hypertension) 10/26/2010   Kidney stones 10/26/2010   DDD (degenerative disc disease), cervical 10/26/2010   DDD (degenerative disc disease), lumbar 10/26/2010   Left shoulder pain 10/26/2010   Preventative health care 10/26/2010    Patient's Medications  New Prescriptions   No medications on file  Previous Medications   ACETAMINOPHEN (TYLENOL) 325 MG TABLET    Take 2 tablets (650 mg total) by mouth every 6 (six) hours as needed. Do not take more than 4000mg  of tylenol per day   AMLODIPINE (NORVASC) 10 MG TABLET    Take 1 tablet (10 mg total) by mouth daily.   ASPIRIN 81 MG EC TABLET    Take 1 tablet (81 mg total) by mouth daily. Swallow whole.   CITALOPRAM (CELEXA) 20 MG TABLET    Take 1 tablet (20 mg total) by mouth daily.   DARUNAVIR-COBICISTAT-EMTRICITABINE-TENOFOVIR ALAFENAMIDE (SYMTUZA) 800-150-200-10 MG TABS    Take 1 tablet by mouth daily with breakfast.   DICYCLOMINE (BENTYL) 20 MG TABLET    Take 1 tablet (20 mg total) by mouth 2 (two) times daily.   LACTULOSE 20 GM/30ML SOLN    30 ml by mouth up to three times daily   MELOXICAM (MOBIC) 15 MG TABLET    Take 1 tablet (15 mg total) by mouth daily as needed for pain.   MUPIROCIN CREAM (BACTROBAN) 2 %    Apply 1 application topically 2 (two) times daily.   OMEPRAZOLE (PRILOSEC) 20 MG CAPSULE    Take 1 capsule (20 mg total) by mouth  daily as needed (for heartburn).  Modified Medications   No medications on file  Discontinued Medications   No medications on file    Allergies: Allergies  Allergen Reactions   Gadolinium Derivatives Hives   Sulfur Dioxide Nausea And Vomiting   Isovue [Iopamidol] Hives and Itching   Sulfa Antibiotics Nausea And Vomiting    Past Medical History: Past Medical History:  Diagnosis Date   Allergic rhinitis, cause unspecified 10/26/2010   Allergy    DDD (degenerative disc disease), cervical 10/26/2010   DDD (degenerative disc disease), lumbar 10/26/2010   Depression    Generalized headaches    GERD (gastroesophageal reflux disease)    HIV disease ([place]) 06/06/2021   HTN (hypertension) 10/26/2010   Hypertension    Kidney stones    Panic attack    Vaccine counseling 06/06/2021    Social History: Social History   Socioeconomic History   Marital status: Single    Spouse name: Not on file   Number of children: Not on file   Years of education: 14   Highest education level: Not on file  Occupational History   Occupation: Tourist information centre manager    Employer: [employer]  Tobacco Use   Smoking status: Former    Types: Cigarettes    Quit date: 03/20/2014    Years since quitting: 7.2   Smokeless  tobacco: Never  Vaping Use   Vaping Use: Never used  Substance and Sexual Activity   Alcohol use: Yes    Alcohol/week: 6.0 standard drinks    Types: 6 Cans of beer per week    Comment: occ   Drug use: Yes    Types: Marijuana    Comment: THC-daily   Sexual activity: Yes    Comment: declined condoms  Other Topics Concern   Not on file  Social History Narrative   Not on file   Social Determinants of Health   Financial Resource Strain: Not on file  Food Insecurity: Not on file  Transportation Needs: Not on file  Physical Activity: Not on file  Stress: Not on file  Social Connections: Not on file    Labs: Lab Results  Component Value Date   HIV1RNAQUANT 2,610 (H) 05/09/2021    HIV1RNAQUANT 246 (H) 05/04/2021   HIV1RNAQUANT Not Detected 02/13/2021    RPR and STI Lab Results  Component Value Date   LABRPR REACTIVE (A) 05/09/2021   LABRPR REACTIVE (A) 02/13/2021   LABRPR REACTIVE (A) 12/28/2020   LABRPR NON-REACTIVE 10/11/2020   LABRPR REACTIVE (A) 04/26/2020   RPRTITER 1:2 (H) 05/09/2021   RPRTITER 1:2 (H) 02/13/2021   RPRTITER 1:1 (H) 12/28/2020   RPRTITER 1:2 (H) 04/26/2020   RPRTITER 1:2 (H) 04/30/2019    STI Results GC GC CT CT  05/09/2021 Negative - Negative -  02/13/2021 Negative - Negative -  02/13/2021 Negative - Negative -  12/28/2020 Negative - Negative -  12/28/2020 Negative - Negative -  12/28/2020 Negative - Negative -  11/08/2016 - NOT DETECTED - NOT DETECTED    Hepatitis B Lab Results  Component Value Date   HEPBSAB REACTIVE (A) 05/09/2021   HEPBSAG NON-REACTIVE 05/09/2021   HEPBCAB REACTIVE (A) 05/09/2021   Hepatitis C Lab Results  Component Value Date   HEPCAB NON-REACTIVE 05/09/2021   Hepatitis A Lab Results  Component Value Date   HAV NON-REACTIVE 05/09/2021   Lipids: Lab Results  Component Value Date   CHOL 200 (H) 05/09/2021   TRIG 120 05/09/2021   HDL 60 05/09/2021   CHOLHDL 3.3 05/09/2021   VLDL 49.6 04/02/2018   LDLCALC 117 (H) 05/09/2021    Current HIV Regimen: Symtuza  Assessment: Mason Morales is here today to follow up after starting Symtuza for his newly diagnosed HIV infection. He saw me back on January 17th for a newly found HIV infection while being on Apretude injection for HIV PrEP. I started him on Symtuza at that time and scheduled him with Dr. Earlene Plater for February 2nd. He cancelled that appointment because he was out of town for a funeral and rescheduled for 2/28. He called triage yesterday requesting a refill on his Symtuza, so they scheduled him with me. I wanted him to see a provider, so Dr. Daiva Eves agreed to see him as well. I checked a genotype during last visit and there were no resistance mutations  found, surprisingly. Dr. Daiva Eves will check a genosure archive today to make sure and possibly transition patient to [facility] or [facility] if able.   He states that he has taken his Symtuza every day and has not missed any doses since starting. He is tolerating it well without any side effects. States that he takes it every morning with breakfast. No other medications. No issues today. He is still processing his diagnosis. I again offered our counseling services here, but he would prefer to talk with his friends for  comfort.  His [place] Noon is approved so will send his Symtuza to [facility] for him to pick up.  He will run out of his current supply on Thursday.   Plan: - Continue Symtuza - F/u with Dr. Tommy Medal in 1 month  Duyen Beckom L. Eber Hong, PharmD, BCIDP, AAHIVP, CPP Clinical Pharmacist Practitioner Infectious [facility] [place] [facility] 06/07/2021, 11:50 AM

## 2021-06-08 ENCOUNTER — Other Ambulatory Visit (HOSPITAL_COMMUNITY): Payer: Self-pay

## 2021-06-08 LAB — HIV ANTIBODY (ROUTINE TESTING W REFLEX): HIV 1&2 Ab, 4th Generation: NONREACTIVE

## 2021-06-08 NOTE — Progress Notes (Signed)
Antibody still negative!

## 2021-06-10 LAB — CBC WITH DIFFERENTIAL/PLATELET
Absolute Monocytes: 330 cells/uL (ref 200–950)
Basophils Absolute: 40 cells/uL (ref 0–200)
Basophils Relative: 0.9 %
Eosinophils Absolute: 40 cells/uL (ref 15–500)
Eosinophils Relative: 0.9 %
HCT: 43 % (ref 38.5–50.0)
Hemoglobin: 14.3 g/dL (ref 13.2–17.1)
Lymphs Abs: 2174 cells/uL (ref 850–3900)
MCH: 29.3 pg (ref 27.0–33.0)
MCHC: 33.3 g/dL (ref 32.0–36.0)
MCV: 88.1 fL (ref 80.0–100.0)
MPV: 9.5 fL (ref 7.5–12.5)
Monocytes Relative: 7.5 %
Neutro Abs: 1817 cells/uL (ref 1500–7800)
Neutrophils Relative %: 41.3 %
Platelets: 208 10*3/uL (ref 140–400)
RBC: 4.88 10*6/uL (ref 4.20–5.80)
RDW: 13.5 % (ref 11.0–15.0)
Total Lymphocyte: 49.4 %
WBC: 4.4 10*3/uL (ref 3.8–10.8)

## 2021-06-10 LAB — COMPLETE METABOLIC PANEL WITH GFR
AG Ratio: 1.3 (calc) (ref 1.0–2.5)
ALT: 10 U/L (ref 9–46)
AST: 16 U/L (ref 10–35)
Albumin: 4.3 g/dL (ref 3.6–5.1)
Alkaline phosphatase (APISO): 79 U/L (ref 35–144)
BUN: 17 mg/dL (ref 7–25)
CO2: 28 mmol/L (ref 20–32)
Calcium: 9.2 mg/dL (ref 8.6–10.3)
Chloride: 103 mmol/L (ref 98–110)
Creat: 1.26 mg/dL (ref 0.70–1.35)
Globulin: 3.3 g/dL (calc) (ref 1.9–3.7)
Glucose, Bld: 98 mg/dL (ref 65–99)
Potassium: 3.7 mmol/L (ref 3.5–5.3)
Sodium: 138 mmol/L (ref 135–146)
Total Bilirubin: 0.4 mg/dL (ref 0.2–1.2)
Total Protein: 7.6 g/dL (ref 6.1–8.1)
eGFR: 64 mL/min/{1.73_m2} (ref >=60)

## 2021-06-10 LAB — HIV RNA, RTPCR W/R GT (RTI, PI,INT)
HIV 1 RNA Quant: NOT DETECTED copies/mL
HIV-1 RNA Quant, Log: NOT DETECTED Log copies/mL

## 2021-06-10 LAB — FLUORESCENT TREPONEMAL AB(FTA)-IGG-BLD: Fluorescent Treponemal ABS: REACTIVE — AB

## 2021-06-10 LAB — C. TRACHOMATIS/N. GONORRHOEAE RNA
C. trachomatis RNA, TMA: NOT DETECTED
N. gonorrhoeae RNA, TMA: NOT DETECTED

## 2021-06-10 LAB — T-HELPER CELLS (CD4) COUNT (NOT AT ARMC)
Absolute CD4: 966 cells/uL (ref 490–1740)
CD4 T Helper %: 45 % (ref 30–61)
Total lymphocyte count: 2144 cells/uL (ref 850–3900)

## 2021-06-10 LAB — RPR: RPR Ser Ql: REACTIVE — AB

## 2021-06-10 LAB — RPR TITER: RPR Titer: 1:2 {titer} — ABNORMAL HIGH

## 2021-06-20 ENCOUNTER — Ambulatory Visit: Payer: Self-pay | Admitting: Internal Medicine

## 2021-06-20 ENCOUNTER — Ambulatory Visit: Payer: Self-pay | Admitting: Pharmacist

## 2021-06-28 ENCOUNTER — Ambulatory Visit (INDEPENDENT_AMBULATORY_CARE_PROVIDER_SITE_OTHER): Payer: Self-pay | Admitting: Internal Medicine

## 2021-06-28 ENCOUNTER — Encounter: Payer: Self-pay | Admitting: Internal Medicine

## 2021-06-28 ENCOUNTER — Other Ambulatory Visit: Payer: Self-pay

## 2021-06-28 VITALS — BP 155/78 | HR 72 | Temp 98.4°F | Ht 69.0 in | Wt 167.0 lb

## 2021-06-28 DIAGNOSIS — B20 Human immunodeficiency virus [HIV] disease: Secondary | ICD-10-CM

## 2021-06-28 NOTE — Patient Instructions (Signed)
We are thinking of transitioning you back to injectable as form of treatment ? ?We need to do it safely so will discuss whether or not further resistance testing is needed ? ?We can follow up on this during next visit 3 months from now ?

## 2021-06-28 NOTE — Progress Notes (Addendum)
?  ? ? ? ? ?[place] [facility] for Infectious Disease ? ?Reason for Consult:new hiv patient ?Referring Provider: [facility] HIV research ? ? ? ?Patient Active Problem List  ? Diagnosis Date Noted  ? HIV disease ([place]) 06/06/2021  ? Vaccine counseling 06/06/2021  ? 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 Chronic constipation 07/22/2019  ? Left lumbar radiculopathy 03/26/2017  ? Acute sinus infection 12/14/2016  ? Pelvic pain 08/24/2016  ? Left cervical radiculopathy 05/20/2015  ? Anxiety state 05/20/2015  ? GERD (gastroesophageal reflux disease) 10/26/2010  ? Allergic rhinitis 10/26/2010  ? HTN (hypertension) 10/26/2010  ? Kidney stones 10/26/2010  ? DDD (degenerative disc disease), cervical 10/26/2010  ? DDD (degenerative disc disease), lumbar 10/26/2010  ? Left shoulder pain 10/26/2010  ? Preventative health care 10/26/2010  ? ? ? ? ?HPI: Mason Morales is a 66 y.o. male htn, gerd, kidney stones, previously on appretude study who acquired HIV and referred here for new hiv patient management ? ? ?He has been in the appretude study with Dr Lucianne Lei Dam/[facility] research group (hptn 084 study). He had transitioned to commercial appretude after the study ? ?He was infected with monkey pox/treated in 12/2020 ? ?It appears he failed to get one injection of apretude in 03/2021. He presented to the ER 12/29 with flu like symptoms. He returned to [facility] 04/2021 with viral load 246 although hiv screen was negative. Rapid start initiated with 2nd viral load of 2610 the same month. His initial reflex genotype/integrase testing showed no resistance. ? ?He saw dr Drucilla Schmidt 2/23 and had an archive genotype ordered ? ?He has been on symtuza. No missed dose last 4 weeks. Tolerate it well.  ?Other meds: ?Celexa ?Amlodipine ?Bentyl ?Mobic ?Asa ?Prednisone ? ? ?He had syphilis infection hx. Rpr has been stable at 1:2. He was given early latent treatment 2 months prior to this visit (04/2021), and years ago also for  early latent (never treated as complicated syphilis or late latent syphilis) ? ? ? ?Recent infectious disease serology: ?05/09/2021 ?Rpr 1:2 -- has been at this level for the past several years dating back to 2018 ?Hep a Ab nr; hep b sAb reactive; hep b cAb reactive; hep b sAg NR; hep c Ab nr ?Hiv rna 2610; hiv antibody negative ?Hiv genotype -- no bictegravir, carbotegravir, eltegravir resistance; RT RAM r211a, without any phenotypic resistance; PR RAM e35d and v77i, without protease inhibitor resistance ?Quantiferon gold negative ? ?06/07/2021 ?Hiv rna negative; cd4 966 ?Hiv ab negative ?Triple screen gc/chlamydia negative ? ? ?Patient is bi-sexual, but doesn't do receptive anal sex. He never had the hpv vaccine ? 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 ? ?Review of Systems: ?ROS ?All other ROS negative ? ? ? ? ? ?Past Medical History:  ?Diagnosis Date  ? Allergic rhinitis, cause unspecified 10/26/2010  ? Allergy   ? DDD (degenerative disc disease), cervical 10/26/2010  ? DDD (degenerative disc disease), lumbar 10/26/2010  ? Depression   ? Generalized headaches   ? GERD (gastroesophageal reflux disease)   ? HIV disease ([place]) 06/06/2021  ? HTN (hypertension) 10/26/2010  ? Hypertension   ? Kidney stones   ? Panic attack   ? Vaccine counseling 06/06/2021  ? ? ?Social History  ? ?  Tobacco Use  ? Smoking status: Former  ?  Types: Cigarettes  ?  Quit date: 03/20/2014  ?  Years since quitting: 7.2  ? Smokeless tobacco: Never  ?Vaping Use  ? Vaping Use: Never used  ?Substance Use Topics  ? Alcohol use: Yes  ?  Alcohol/week: 6.0 standard drinks  ?  Types: 6 Cans of beer per week  ?  Comment: occasionally  ? Drug use: Yes  ?  Types: Marijuana  ?  Comment: THC-daily  ? ? ?Family History   ?Problem Relation Age of Onset  ? Hyperlipidemia Other   ? Heart disease Other   ? Stroke Other   ? Hypertension Other   ? Diabetes Other   ? Cancer Other   ?     prostate cancer  ? Diabetes Other   ? Alcohol abuse Other   ? Cancer Other   ?     breast cancer  ? Stroke Other   ? Colon cancer Neg Hx   ? Esophageal cancer Neg Hx   ? Pancreatic cancer Neg Hx   ? Rectal cancer Neg Hx   ? Stomach cancer Neg Hx   ? ? ?Allergies  ?Allergen Reactions  ? Gadolinium Derivatives Hives  ? Sulfur Dioxide Nausea And Vomiting  ? Isovue [Iopamidol] Hives and Itching  ? Sulfa Antibiotics Nausea And Vomiting  ? ? ?OBJECTIVE: ?Vitals:  ? 06/28/21 1036  ?BP: (!) 155/78  ?Pulse: 72  ?Temp: 98.4 ?F (36.9 ?C)  ?TempSrc: Oral  ?SpO2: 98%  ?Weight: 167 lb (75.8 kg)  ?Height: 5\' 9"  (1.753 m)  ? ?Body mass index is 24.66 kg/m?. ? ? ?Physical Exam ?General/constitutional: no distress, pleasant ?HEENT: Normocephalic, PER, Conj Clear, EOMI, Oropharynx clear ?Neck supple ?CV: rrr no mrg ?Lungs: clear to auscultation, normal respiratory effort ?Abd: Soft, Nontender ?Ext: no edema ?Skin: No Rash ?Neuro: nonfocal ?MSK: no peripheral joint swelling/tenderness/warmth; back spines nontender ?Psych: alert/oriented ? ? ? ?Lab: ?See labs above ? ? ?Microbiology: ? ?Serology: ? ?Imaging: ? ? ?Assessment/plan: ?Problem List Items Addressed This Visit   ? ?  ? Other  ? HIV disease ([place]) - Primary  ? ? ? ? ?#hiv ?Appears to have an acute retroviral syndrome early 04/2021 after he missed dose of appretude 03/2021 (only 1 missed dose) ?His viral load was 2600 when a genotype was done and showed no rilpivirine mutation or cabotegravir mutation ?I am unclear if at this time his viral load undetectable an archived genotype would be helpful ? ?He was started on symtuza since 05/2021 and is currently undetectable ? ?He prefers injectable for treatment ? ? ?It is unclear why he contracted hiv given the duration of appretude and no evidence cabutegravir resistance  (albeit no archive genotype available) ? ?If archive is not able to be done, could consider dovato/biktarvy or cabenuva as next medication given improved DDI profile over symtuza. Will discuss this with Dr Tommy Medal. Close monitoring would be required initially given he contracted hiv while on appretude to detect integrase strand inhibitor resistance ? ? ?----------- ?Addendum ?I called archive monogram [facility] today 3/8 -- they are still running the archive on 2/15 sample sent to them; a repeat was needed to be run 3/07 which failed and they are rerunning it. Hopefully they'll have final result and fax to [facility] next week ? ? ?-of note, his hiv antibody was negative 06/07/2021 due to early treatment and might take a few months to seroconvert -- repeat testing can be done in  3 months ?-discussed u=u ?-encourage compliance ?-continue current HIV medication ?-labs in 3 months during follow up ? ? ?#hcm ?-vaccination ?-hepatitis ?Prior hep b infection/immune ?-std screen negative 06/07/2021 ?-annual tb screen negative 05/2021 ?-cancer screening  ?Colonoscopy negative 2021 (polyps noncancer) - due every 5 years for now ?Prostate psa screening previously negative - will see his pcp again this year ? ? ? ? ? ?Follow-up: Return in about 3 months (around 09/28/2021). ? ?Jabier Mutton, MD ?[facility] Infectious Disease ?[place] ?[phone] pager   [phone] cell ?06/28/2021, 10:56 AM ? ?

## 2021-07-03 ENCOUNTER — Encounter: Payer: Self-pay | Admitting: Internal Medicine

## 2021-07-04 ENCOUNTER — Ambulatory Visit: Payer: Self-pay | Admitting: Pharmacist

## 2021-07-04 ENCOUNTER — Other Ambulatory Visit: Payer: Self-pay

## 2021-07-07 ENCOUNTER — Telehealth: Payer: Self-pay

## 2021-07-07 MED ORDER — AMLODIPINE BESYLATE 10 MG PO TABS: 10.0000 mg | ORAL_TABLET | Freq: Every day | ORAL | 1 refills | Status: DC

## 2021-07-07 NOTE — Telephone Encounter (Signed)
Pt is requesting a refill on: ?amLODipine (NORVASC) 10 MG tablet ? ?Pharmacy: ?[id number] - [place], [place] - [address] AT [facility] [address] ? ?Pt made an appt after repeatedly asking if he wanting to make an appt since the pharmacy stated it was denied for needing to schedule an appt. Pt states he doesn't get paid til the 12th so we scheduled for that day.  ? ?Pt stated that if it not approved he will just go to the ER and be charge 1500$ and not pay it... ? ?LOV 11/30/20 ?ROV 08/02/21 ? ? ?

## 2021-07-07 NOTE — Telephone Encounter (Signed)
Spoke with patient to notify that refill request was never received from the pharmacy. Patient last seen August 2022 and was advised to return in 1 year. Appt rescheduled for December 04, 2021 and prescription sent ?

## 2021-07-25 ENCOUNTER — Telehealth: Payer: Self-pay | Admitting: Pharmacist

## 2021-07-25 NOTE — Telephone Encounter (Signed)
Patient's [employer] is located under media tab.  ? ?[place] ? ?Date: 06/07/21 ? ?RT Mutations    ?PI Mutations    ?Integrase Mutations  T66T/A  ? ?Interpretation of Genotype Data per [employer] HIV Drug Resistance Database: ? ?Nucleoside RTIs  ?Abacavir - sensitive ?Zidovudine - sensitive ?Emtricitabine - sensitive ?Lamivudine - sensitive ?Tenofovir - sensitive  ? ?Non-Nucleoside RTIs  ?Doravirine - sensitive ?Efavirenz - sensitive ?Etravirine - sensitive ?Nevirapine - sensitive ?Rilpivirine - sensitive  ? ?Protease Inhibitors  ?Atazanavir - sensitive ?Darunavir - sensitive ?Lopinavir - sensitive  ? ?Integrase Inhibitors  ?Bictegravir - sensitive ?Cabotegravir - sensitive ?Dolutegravir - sensitive ?Elvitegravir - resistant ?Raltegravir - resistant  ? ?Glorianna Gott L. Jacqualyn Sedgwick, PharmD, BCIDP, AAHIVP, CPP ?Clinical Pharmacist Practitioner ?Infectious Diseases Clinical Pharmacist ?[place] for Infectious Disease ?07/25/2021, 9:05 AM ? ?

## 2021-07-28 ENCOUNTER — Telehealth: Payer: Self-pay | Admitting: Pharmacist

## 2021-07-28 NOTE — Telephone Encounter (Signed)
Called patient to discuss starting Cabenuva. No answer, left HIPAA compliant VM to call me back. ? ?Mason Morales, PharmD ?[facility] Clinical Pharmacist Practitioner ? ?

## 2021-08-02 ENCOUNTER — Ambulatory Visit: Payer: Self-pay | Admitting: Internal Medicine

## 2021-09-28 ENCOUNTER — Encounter: Payer: Self-pay | Admitting: Internal Medicine

## 2021-09-28 ENCOUNTER — Other Ambulatory Visit: Payer: Self-pay

## 2021-09-28 ENCOUNTER — Ambulatory Visit (INDEPENDENT_AMBULATORY_CARE_PROVIDER_SITE_OTHER): Payer: Self-pay | Admitting: Internal Medicine

## 2021-09-28 VITALS — BP 129/80 | HR 101 | Temp 97.6°F | Wt 156.0 lb

## 2021-09-28 DIAGNOSIS — B20 Human immunodeficiency virus [HIV] disease: Secondary | ICD-10-CM

## 2021-09-28 DIAGNOSIS — Z113 Encounter for screening for infections with a predominantly sexual mode of transmission: Secondary | ICD-10-CM

## 2021-09-28 MED ORDER — CABOTEGRAVIR & RILPIVIRINE ER 600 & 900 MG/3ML IM SUER: 1.0000 | INTRAMUSCULAR | 5 refills | Status: DC

## 2021-09-28 MED ORDER — CABOTEGRAVIR & RILPIVIRINE ER 600 & 900 MG/3ML IM SUER: 1.0000 | INTRAMUSCULAR | 1 refills | Status: DC

## 2021-09-28 NOTE — Progress Notes (Signed)
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 male htn, gerd, kidney stones, previously on appretude study who acquired HIV and referred here for new hiv patient management   He has been in the appretude study with Dr Lucianne Lei Dam/[facility] research group (hptn 084 study). He had transitioned to commercial appretude after the study  He was infected with monkey pox/treated in 12/2020  It appears he failed to get one injection of apretude in 03/2021. He presented to the ER 12/29 with flu like symptoms. He returned to [facility] 04/2021 with viral load 246 although hiv screen was negative. Rapid start initiated with 2nd viral load of 2610 the same month. His initial reflex genotype/integrase testing showed no resistance.  He saw dr Drucilla Schmidt 2/23 and had an archive genotype ordered  He has been on symtuza. No missed dose last 4 weeks. Tolerate it well.  Other meds: Celexa Amlodipine Bentyl Mobic Asa Prednisone   He had syphilis infection hx. Rpr has been stable at 1:2. He was given early latent treatment 2 months prior to this visit (04/2021), and years ago also for  early latent (never treated as complicated syphilis or late latent syphilis)    Recent infectious disease serology: 05/09/2021 Rpr 1:2 -- has been at this level for the past several years dating back to 2018 Hep a Ab nr; hep b sAb reactive; hep b cAb reactive; hep b sAg NR; hep c Ab nr Hiv rna 2610; hiv antibody negative Hiv genotype -- no bictegravir, carbotegravir, eltegravir resistance; RT RAM r211a, without any phenotypic resistance; PR RAM e35d and v77i, without protease inhibitor resistance Quantiferon gold negative  06/07/2021 Hiv rna negative; cd4 966 Hiv ab negative Triple screen gc/chlamydia negative  09/28/21 id clinic f/u Sex 10 days ago broke condom-- that person has gonorrhea (texted him). No sx. Wants testing. No oral/anal receptive sex Doing well Missed a dose of symtuza the last several weeks No complaint  Wants to get back on cabenuva    ------------ Patient is bi-sexual, but doesn't do receptive anal sex. 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, Insurance risk surveyor No substance use; occasional thc (used for anxiety and panic attack) Hobbies -- professional equestrian; enjoy dogs pet; tennis; skiing   Review of Systems: ROS All other ROS negative      Past Medical History:  Diagnosis Date   Allergic rhinitis, cause unspecified 10/26/2010   Allergy    DDD (degenerative disc disease), cervical 10/26/2010   DDD (degenerative disc disease), lumbar 10/26/2010   Depression  Generalized headaches    GERD (gastroesophageal reflux disease)    HIV disease ([place]) 06/06/2021   HTN (hypertension) 10/26/2010   Hypertension    Kidney stones    Panic attack    Vaccine counseling 06/06/2021    Social History   Tobacco Use   Smoking status: Former    Types: Cigarettes    Quit date: 03/20/2014    Years since quitting: 7.5   Smokeless tobacco:  Never  Vaping Use   Vaping Use: Never used  Substance Use Topics   Alcohol use: Yes    Alcohol/week: 6.0 standard drinks of alcohol    Types: 6 Cans of beer per week    Comment: occasionally   Drug use: Yes    Types: Marijuana    Comment: THC-daily    Family History  Problem Relation Age of Onset   Hyperlipidemia Other    Heart disease Other    Stroke Other    Hypertension Other    Diabetes Other    Cancer Other        prostate cancer   Diabetes Other    Alcohol abuse Other    Cancer Other        breast cancer   Stroke Other    Colon cancer Neg Hx    Esophageal cancer Neg Hx    Pancreatic cancer Neg Hx    Rectal cancer Neg Hx    Stomach cancer Neg Hx     Allergies  Allergen Reactions   Gadolinium Derivatives Hives   Sulfur Dioxide Nausea And Vomiting   Isovue [Iopamidol] Hives and Itching   Sulfa Antibiotics Nausea And Vomiting    OBJECTIVE: Vitals:   09/28/21 1032  BP: 129/80  Pulse: (!) 101  Temp: 97.6 F (36.4 C)  TempSrc: Oral  Weight: 156 lb (70.8 kg)   Body mass index is 23.04 kg/m.   Physical Exam General/constitutional: no distress, pleasant HEENT: Normocephalic, PER, Conj Clear, EOMI, Oropharynx clear Neck supple CV: rrr no mrg Lungs: clear to auscultation, normal respiratory effort Abd: Soft, Nontender Ext: no edema Skin: No Rash Neuro: nonfocal MSK: no peripheral joint swelling/tenderness/warmth; back spines nontender       Lab: Lab Results  Component Value Date   WBC 4.4 06/07/2021   HGB 14.3 06/07/2021   HCT 43.0 06/07/2021   MCV 88.1 06/07/2021   PLT 208 70/04/7492   Last metabolic panel Lab Results  Component Value Date   GLUCOSE 98 06/07/2021   NA 138 06/07/2021   K 3.7 06/07/2021   CL 103 06/07/2021   CO2 28 06/07/2021   BUN 17 06/07/2021   CREATININE 1.26 06/07/2021   EGFR 64 06/07/2021   CALCIUM 9.2 06/07/2021   PHOS 2.1 (L) 08/20/2019   PROT 7.6 06/07/2021   ALBUMIN 4.5 04/02/2018   BILITOT 0.4  06/07/2021   ALKPHOS 82 04/02/2018   AST 16 06/07/2021   ALT 10 06/07/2021   ANIONGAP 6 04/20/2021     HIV:    Microbiology:  Serology:  Imaging:   Assessment/plan: Problem List Items Addressed This Visit       Other   HIV disease ([place]) - Primary   Relevant Orders   CBC   COMPLETE METABOLIC PANEL WITH GFR   HIV-1 RNA quant-no reflex-bld   Other Visit Diagnoses     Screening for STDs (sexually transmitted diseases)       Relevant Orders   Urine cytology ancillary only([place])   RPR   Fluorescent treponemal ab(fta)-IgG-bld        #  hiv Appears to have an acute retroviral syndrome early 04/2021 after he missed dose of appretude 03/2021 (only 1 missed dose) His viral load was 2600 when a genotype was done and showed no rilpivirine mutation or cabotegravir mutation I am unclear if at this time his viral load undetectable an archived genotype would be helpful  He was started on symtuza since 05/2021 and is currently undetectable  He prefers injectable for treatment   It is unclear why he contracted hiv given the duration of appretude and no evidence cabutegravir resistance (albeit no archive genotype available)  If archive is not able to be done, could consider dovato/biktarvy or cabenuva as next medication given improved DDI profile over symtuza. Will discuss this with Dr Tommy Medal. Close monitoring would be required initially given he contracted hiv while on appretude to detect integrase strand inhibitor resistance  Archive monogram 2/15 was negative for integrase resistance  He has been on symtuza but wants go go back on injection    -discussed u=u -encourage compliance -continue current HIV medication; refer to pharmacy for cabenuva -labs today -f/u in 2 months   #exposure to gonorrhea Has condom broke during recent sexual intercourse with someone who had gonorrhea 10 days prior to 09/28/21 visit  -std testing urine test only (no oral or anal  receptive sex)    #hcm -vaccination -hepatitis Prior hep b infection/immune -std screen negative 06/07/2021 -annual tb screen negative 05/2021 -cancer screening  Colonoscopy negative 2021 (polyps noncancer) - due every 5 years for now Prostate psa screening previously negative - will see his pcp again this year      Follow-up: Return in about 2 months (around 11/28/2021).  Jabier Mutton, [place] for [place] [phone] pager   [phone] cell 09/28/2021, 11:11 AM

## 2021-09-28 NOTE — Patient Instructions (Signed)
Will contact our pharmacy team about getting you back on cabenuva  Continue symtuza for now   Blood tests and std tests today  See [place] in 2 months

## 2021-09-28 NOTE — Addendum Note (Signed)
Addended by: Harley Alto on: 09/28/2021 11:43 AM   Modules accepted: Orders

## 2021-09-28 NOTE — Addendum Note (Signed)
Addended by: Magda Kiel L on: 09/28/2021 11:35 AM   Modules accepted: Orders

## 2021-09-28 NOTE — Progress Notes (Signed)
Met with patient to discuss Cabenuva. He was on Apretude before becoming positive so he is aware of importance of coming to injection appointments and schedule of doses. He has Chesley Noon so will send scripts to [facility] in [place]. He will come next Thursday 6/15 for first set of injections.   Maly Lemarr L. Giovani Neumeister, PharmD, BCIDP, AAHIVP, CPP Clinical Pharmacist Practitioner Infectious Diseases [place] for Infectious Disease 09/28/2021, 11:34 AM

## 2021-09-30 LAB — FLUORESCENT TREPONEMAL AB(FTA)-IGG-BLD: Fluorescent Treponemal ABS: REACTIVE — AB

## 2021-10-01 LAB — COMPLETE METABOLIC PANEL WITH GFR
AG Ratio: 1.3 (calc) (ref 1.0–2.5)
ALT: 10 U/L (ref 9–46)
AST: 16 U/L (ref 10–35)
Albumin: 4.5 g/dL (ref 3.6–5.1)
Alkaline phosphatase (APISO): 80 U/L (ref 35–144)
BUN: 10 mg/dL (ref 7–25)
CO2: 27 mmol/L (ref 20–32)
Calcium: 9.5 mg/dL (ref 8.6–10.3)
Chloride: 102 mmol/L (ref 98–110)
Creat: 1.19 mg/dL (ref 0.70–1.35)
Globulin: 3.4 g/dL (calc) (ref 1.9–3.7)
Glucose, Bld: 121 mg/dL — ABNORMAL HIGH (ref 65–99)
Potassium: 3.4 mmol/L — ABNORMAL LOW (ref 3.5–5.3)
Sodium: 139 mmol/L (ref 135–146)
Total Bilirubin: 0.5 mg/dL (ref 0.2–1.2)
Total Protein: 7.9 g/dL (ref 6.1–8.1)
eGFR: 69 mL/min/{1.73_m2} (ref >=60)

## 2021-10-01 LAB — CBC
HCT: 40.1 % (ref 38.5–50.0)
Hemoglobin: 13.6 g/dL (ref 13.2–17.1)
MCH: 31.1 pg (ref 27.0–33.0)
MCHC: 33.9 g/dL (ref 32.0–36.0)
MCV: 91.6 fL (ref 80.0–100.0)
MPV: 9.5 fL (ref 7.5–12.5)
Platelets: 246 10*3/uL (ref 140–400)
RBC: 4.38 10*6/uL (ref 4.20–5.80)
RDW: 13.1 % (ref 11.0–15.0)
WBC: 5.7 10*3/uL (ref 3.8–10.8)

## 2021-10-01 LAB — FLUORESCENT TREPONEMAL AB(FTA)-IGG-BLD: Fluorescent Treponemal ABS: REACTIVE — AB

## 2021-10-01 LAB — HIV-1 RNA QUANT-NO REFLEX-BLD
HIV 1 RNA Quant: NOT DETECTED Copies/mL
HIV-1 RNA Quant, Log: NOT DETECTED Log cps/mL

## 2021-10-01 LAB — RPR: RPR Ser Ql: REACTIVE — AB

## 2021-10-01 LAB — RPR TITER: RPR Titer: 1:2 {titer} — ABNORMAL HIGH

## 2021-10-02 LAB — URINE CYTOLOGY ANCILLARY ONLY
Chlamydia: NEGATIVE
Comment: NEGATIVE
Comment: NEGATIVE
Comment: NORMAL
Neisseria Gonorrhea: NEGATIVE
Trichomonas: NEGATIVE

## 2021-10-03 ENCOUNTER — Telehealth: Payer: Self-pay

## 2021-10-03 NOTE — Telephone Encounter (Signed)
[facility] Patient Advocate Encounter  Patient's medication (cabenuva)have been couriered to [facility] from [facility] and will be administered on the patient next office visit on 10/05/21.  Clearance [name] , CPhT Specialty Pharmacy Patient [facility] for Infectious Disease Phone: [phone] Fax:  [phone]

## 2021-10-05 ENCOUNTER — Other Ambulatory Visit: Payer: Self-pay

## 2021-10-05 ENCOUNTER — Ambulatory Visit (INDEPENDENT_AMBULATORY_CARE_PROVIDER_SITE_OTHER): Payer: Self-pay | Admitting: Pharmacist

## 2021-10-05 DIAGNOSIS — B20 Human immunodeficiency virus [HIV] disease: Secondary | ICD-10-CM

## 2021-10-05 MED ORDER — CABOTEGRAVIR & RILPIVIRINE ER 600 & 900 MG/3ML IM SUER
1.0000 | Freq: Once | INTRAMUSCULAR | Status: AC
  Administered 2021-10-05: 1 via INTRAMUSCULAR

## 2021-10-05 NOTE — Progress Notes (Signed)
HPI: Mason Morales is a 66 y.o. male who presents to the [facility] for [place] administration.  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   Chronic constipation 07/22/2019   Left lumbar radiculopathy 03/26/2017   Acute sinus infection 12/14/2016   Pelvic pain 08/24/2016   Left cervical radiculopathy 05/20/2015   Anxiety state 05/20/2015   GERD (gastroesophageal reflux disease) 10/26/2010   Allergic rhinitis 10/26/2010   HTN (hypertension) 10/26/2010   Kidney stones 10/26/2010   DDD (degenerative disc disease), cervical 10/26/2010   DDD (degenerative disc disease), lumbar 10/26/2010   Left shoulder pain 10/26/2010   Preventative health care 10/26/2010    Patient's Medications  New Prescriptions   No medications on file  Previous Medications   ACETAMINOPHEN (TYLENOL) 325 MG TABLET    Take 2 tablets (650 mg total) by mouth every 6 (six) hours as needed. Do not take more than 4080m of tylenol per day   AMLODIPINE (NORVASC) 10 MG TABLET    Take 1 tablet (10 mg total) by mouth daily.   ASPIRIN 81 MG EC TABLET    Take 1 tablet (81 mg total) by mouth daily. Swallow whole.   CABOTEGRAVIR & RILPIVIRINE ER (CABENUVA) 600 & 900 MG/3ML INJECTION    Inject 1 kit into the muscle every 2 (two) months.   CABOTEGRAVIR & RILPIVIRINE ER (CABENUVA) 600 & 900 MG/3ML INJECTION    Inject 1 kit into the muscle every 30 (thirty) days.   CITALOPRAM (CELEXA) 20 MG TABLET    Take 1 tablet (20 mg total) by mouth daily.   DARUNAVIR-COBICISTAT-EMTRICITABINE-TENOFOVIR ALAFENAMIDE (SYMTUZA) 800-150-200-10 MG TABS    Take 1 tablet by mouth daily with breakfast.   DICYCLOMINE (BENTYL) 20 MG TABLET    Take 1 tablet (20 mg total) by mouth 2 (two) times daily.   LACTULOSE 20 GM/30ML SOLN    30 ml by mouth up to three times daily   MELOXICAM  (MOBIC) 15 MG TABLET    Take 1 tablet (15 mg total) by mouth daily as needed for pain.   MUPIROCIN CREAM (BACTROBAN) 2 %    Apply 1 application topically 2 (two) times daily.   OMEPRAZOLE (PRILOSEC) 20 MG CAPSULE    Take 1 capsule (20 mg total) by mouth daily as needed (for heartburn).  Modified Medications   No medications on file  Discontinued Medications   No medications on file    Allergies: Allergies  Allergen Reactions   Gadolinium Derivatives Hives   Sulfur Dioxide Nausea And Vomiting   Isovue [Iopamidol] Hives and Itching   Sulfa Antibiotics Nausea And Vomiting    Past Medical History: Past Medical History:  Diagnosis Date   Allergic rhinitis, cause unspecified 10/26/2010   Allergy    DDD (degenerative disc disease), cervical 10/26/2010   DDD (degenerative disc disease), lumbar 10/26/2010   Depression    Generalized headaches    GERD (gastroesophageal reflux disease)    HIV disease ([place] 06/06/2021   HTN (hypertension) 10/26/2010   Hypertension    Kidney stones    Panic attack    Vaccine counseling 06/06/2021    Social History: Social History   Socioeconomic History   Marital status: Single    Spouse name: Not on file   Number of children: Not on file   Years of education: 14   Highest  education level: Not on file  Occupational History   Occupation: Tourist information centre manager    Employer: [employer]  Tobacco Use   Smoking status: Former    Types: Cigarettes    Quit date: 03/20/2014    Years since quitting: 7.5   Smokeless tobacco: Never  Vaping Use   Vaping Use: Never used  Substance and Sexual Activity   Alcohol use: Yes    Alcohol/week: 6.0 standard drinks of alcohol    Types: 6 Cans of beer per week    Comment: occasionally   Drug use: Yes    Types: Marijuana    Comment: THC-daily   Sexual activity: Yes    Comment: declined condoms  Other Topics Concern   Not on file  Social History Narrative   Not on file   Social Determinants of Health    Financial Resource Strain: Not on file  Food Insecurity: Not on file  Transportation Needs: Not on file  Physical Activity: Not on file  Stress: Not on file  Social Connections: Not on file    Labs: Lab Results  Component Value Date   HIV1RNAQUANT Not Detected 09/28/2021   HIV1RNAQUANT NOT DETECTED 06/07/2021   HIV1RNAQUANT 2,610 (H) 05/09/2021    RPR and STI Lab Results  Component Value Date   LABRPR REACTIVE (A) 09/28/2021   LABRPR REACTIVE (A) 06/07/2021   LABRPR REACTIVE (A) 05/09/2021   LABRPR REACTIVE (A) 02/13/2021   LABRPR REACTIVE (A) 12/28/2020   RPRTITER 1:2 (H) 09/28/2021   RPRTITER 1:2 (H) 06/07/2021   RPRTITER 1:2 (H) 05/09/2021   RPRTITER 1:2 (H) 02/13/2021   RPRTITER 1:1 (H) 12/28/2020    STI Results GC GC CT CT  09/28/2021 11:18 AM Negative   Negative    05/09/2021 12:25 PM Negative   Negative    02/13/2021  3:57 PM Negative    Negative   Negative    Negative    12/28/2020  2:31 PM Negative    Negative    Negative   Negative    Negative    Negative    11/08/2016  8:15 AM  NOT DETECTED   NOT DETECTED     Hepatitis B Lab Results  Component Value Date   HEPBSAB REACTIVE (A) 05/09/2021   HEPBSAG NON-REACTIVE 05/09/2021   HEPBCAB REACTIVE (A) 05/09/2021   Hepatitis C Lab Results  Component Value Date   HEPCAB NON-REACTIVE 05/09/2021   Hepatitis A Lab Results  Component Value Date   HAV NON-REACTIVE 05/09/2021   Lipids: Lab Results  Component Value Date   CHOL 200 (H) 05/09/2021   TRIG 120 05/09/2021   HDL 60 05/09/2021   CHOLHDL 3.3 05/09/2021   VLDL 27.0 04/02/2018   LDLCALC 117 (H) 05/09/2021    Current HIV Regimen: Symtuza  TARGET DATE: The 15th  Assessment: Mason Morales presents today for their first initiation injection for Cabenuva. Counseled that [place] is two separate intramuscular injections in the gluteal muscle on each side for each visit. Explained that the second injection is 30 days after the initial injection  then every 2 months thereafter. Discussed the need for viral load monitoring every 2 months for the first 6 months and then periodically afterwards as their provider sees the need. Discussed the rare but significant chance of developing resistance despite compliance. Explained that showing up to injection appointments is very important and warned that if 2 appointments are missed, it will be reassessed by their provider whether they are a good candidate for injection therapy. Counseled on  possible side effects associated with the injections such as injection site pain, which is usually mild to moderate in nature, injection site nodules, and injection site reactions. Asked to call the clinic or send me a mychart message if they experience any issues, such as fatigue, nausea, headache, rash, or dizziness. Advised that they can take ibuprofen or tylenol for injection site pain if needed.   Administered cabotegravir 675m/3mL in left upper outer quadrant of the gluteal muscle. Administered rilpivirine 900 mg/37min the right upper outer quadrant of the gluteal muscle. Monitored patient for 10 minutes after injection. Injections were tolerated well without issue. Counseled to stop taking Symtuza after today's dose and to call with any issues that may arise. Will make follow up appointments for second initiation injection in 30 days and then maintenance injections every 2 months thereafter.   Plan: - Stop Symtuza after today's dose - First Cabenuva injections administered - Second initiation injection scheduled for 11/02/21 with me - Maintenance injections scheduled for 01/03/22 with Dr. VuGale Journeynd 03/06/22 with me - Renew Ryan White/HMAP on 11/02/21 as well - Call with any issues or questions  Loda Bialas L. Annalea Alguire, PharmD, BCIDP, AAHIVP, CPIndianalinical Pharmacist Practitioner [facility]

## 2021-10-27 ENCOUNTER — Telehealth: Payer: Self-pay

## 2021-10-27 NOTE — Telephone Encounter (Signed)
[facility] Patient Advocate Encounter  Patient's medication (Cabenuva) have been couriered to [facility] from [facility] and will be administered on the patient next office visit on 11/02/21.  Moss Berry , CPhT [facility] Patient Advocate [facility] Phone: [phone] Fax:  [phone]  

## 2021-11-02 ENCOUNTER — Ambulatory Visit (INDEPENDENT_AMBULATORY_CARE_PROVIDER_SITE_OTHER): Payer: Self-pay | Admitting: Pharmacist

## 2021-11-02 ENCOUNTER — Ambulatory Visit: Payer: Self-pay

## 2021-11-02 ENCOUNTER — Other Ambulatory Visit: Payer: Self-pay

## 2021-11-02 DIAGNOSIS — B20 Human immunodeficiency virus [HIV] disease: Secondary | ICD-10-CM

## 2021-11-02 MED ORDER — CABOTEGRAVIR & RILPIVIRINE ER 600 & 900 MG/3ML IM SUER
1.0000 | Freq: Once | INTRAMUSCULAR | Status: AC
  Administered 2021-11-02: 1 via INTRAMUSCULAR

## 2021-11-02 NOTE — Progress Notes (Signed)
HPI: Mason Morales is a 66 y.o. male who presents to the [facility] for [place] administration.  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   Chronic constipation 07/22/2019   Left lumbar radiculopathy 03/26/2017   Acute sinus infection 12/14/2016   Pelvic pain 08/24/2016   Left cervical radiculopathy 05/20/2015   Anxiety state 05/20/2015   GERD (gastroesophageal reflux disease) 10/26/2010   Allergic rhinitis 10/26/2010   HTN (hypertension) 10/26/2010   Kidney stones 10/26/2010   DDD (degenerative disc disease), cervical 10/26/2010   DDD (degenerative disc disease), lumbar 10/26/2010   Left shoulder pain 10/26/2010   Preventative health care 10/26/2010    Patient's Medications  New Prescriptions   No medications on file  Previous Medications   ACETAMINOPHEN (TYLENOL) 325 MG TABLET    Take 2 tablets (650 mg total) by mouth every 6 (six) hours as needed. Do not take more than 4025m of tylenol per day   AMLODIPINE (NORVASC) 10 MG TABLET    Take 1 tablet (10 mg total) by mouth daily.   ASPIRIN 81 MG EC TABLET    Take 1 tablet (81 mg total) by mouth daily. Swallow whole.   CABOTEGRAVIR & RILPIVIRINE ER (CABENUVA) 600 & 900 MG/3ML INJECTION    Inject 1 kit into the muscle every 2 (two) months.   CABOTEGRAVIR & RILPIVIRINE ER (CABENUVA) 600 & 900 MG/3ML INJECTION    Inject 1 kit into the muscle every 30 (thirty) days.   CITALOPRAM (CELEXA) 20 MG TABLET    Take 1 tablet (20 mg total) by mouth daily.   DARUNAVIR-COBICISTAT-EMTRICITABINE-TENOFOVIR ALAFENAMIDE (SYMTUZA) 800-150-200-10 MG TABS    Take 1 tablet by mouth daily with breakfast.   DICYCLOMINE (BENTYL) 20 MG TABLET    Take 1 tablet (20 mg total) by mouth 2 (two) times daily.   LACTULOSE 20 GM/30ML SOLN    30 ml by mouth up to three times daily   MELOXICAM  (MOBIC) 15 MG TABLET    Take 1 tablet (15 mg total) by mouth daily as needed for pain.   MUPIROCIN CREAM (BACTROBAN) 2 %    Apply 1 application topically 2 (two) times daily.   OMEPRAZOLE (PRILOSEC) 20 MG CAPSULE    Take 1 capsule (20 mg total) by mouth daily as needed (for heartburn).  Modified Medications   No medications on file  Discontinued Medications   No medications on file    Allergies: Allergies  Allergen Reactions   Gadolinium Derivatives Hives   Sulfur Dioxide Nausea And Vomiting   Isovue [Iopamidol] Hives and Itching   Sulfa Antibiotics Nausea And Vomiting    Past Medical History: Past Medical History:  Diagnosis Date   Allergic rhinitis, cause unspecified 10/26/2010   Allergy    DDD (degenerative disc disease), cervical 10/26/2010   DDD (degenerative disc disease), lumbar 10/26/2010   Depression    Generalized headaches    GERD (gastroesophageal reflux disease)    HIV disease ([place] 06/06/2021   HTN (hypertension) 10/26/2010   Hypertension    Kidney stones    Panic attack    Vaccine counseling 06/06/2021    Social History: Social History   Socioeconomic History   Marital status: Single    Spouse name: Not on file   Number of children: Not on file   Years of education: 14   Highest  education level: Not on file  Occupational History   Occupation: Tourist information centre manager    Employer: [employer]  Tobacco Use   Smoking status: Former    Types: Cigarettes    Quit date: 03/20/2014    Years since quitting: 7.6   Smokeless tobacco: Never  Vaping Use   Vaping Use: Never used  Substance and Sexual Activity   Alcohol use: Yes    Alcohol/week: 6.0 standard drinks of alcohol    Types: 6 Cans of beer per week    Comment: occasionally   Drug use: Yes    Types: Marijuana    Comment: THC-daily   Sexual activity: Yes    Comment: declined condoms  Other Topics Concern   Not on file  Social History Narrative   Not on file   Social Determinants of Health    Financial Resource Strain: Not on file  Food Insecurity: Not on file  Transportation Needs: Not on file  Physical Activity: Not on file  Stress: Not on file  Social Connections: Not on file    Labs: Lab Results  Component Value Date   HIV1RNAQUANT Not Detected 09/28/2021   HIV1RNAQUANT NOT DETECTED 06/07/2021   HIV1RNAQUANT 2,610 (H) 05/09/2021    RPR and STI Lab Results  Component Value Date   LABRPR REACTIVE (A) 09/28/2021   LABRPR REACTIVE (A) 06/07/2021   LABRPR REACTIVE (A) 05/09/2021   LABRPR REACTIVE (A) 02/13/2021   LABRPR REACTIVE (A) 12/28/2020   RPRTITER 1:2 (H) 09/28/2021   RPRTITER 1:2 (H) 06/07/2021   RPRTITER 1:2 (H) 05/09/2021   RPRTITER 1:2 (H) 02/13/2021   RPRTITER 1:1 (H) 12/28/2020    STI Results GC GC CT CT  09/28/2021 11:18 AM Negative   Negative    05/09/2021 12:25 PM Negative   Negative    02/13/2021  3:57 PM Negative    Negative   Negative    Negative    12/28/2020  2:31 PM Negative    Negative    Negative   Negative    Negative    Negative    11/08/2016  8:15 AM  NOT DETECTED   NOT DETECTED     Hepatitis B Lab Results  Component Value Date   HEPBSAB REACTIVE (A) 05/09/2021   HEPBSAG NON-REACTIVE 05/09/2021   HEPBCAB REACTIVE (A) 05/09/2021   Hepatitis C Lab Results  Component Value Date   HEPCAB NON-REACTIVE 05/09/2021   Hepatitis A Lab Results  Component Value Date   HAV NON-REACTIVE 05/09/2021   Lipids: Lab Results  Component Value Date   CHOL 200 (H) 05/09/2021   TRIG 120 05/09/2021   HDL 60 05/09/2021   CHOLHDL 3.3 05/09/2021   VLDL 27.0 04/02/2018   LDLCALC 117 (H) 05/09/2021    TARGET DATE: The 15th  Assessment: Mason Morales presents today for his maintenance Cabenuva injections. He states that he was really sore for the first set of injections for about 1-2 days. He is dreading this round but still wants to continue. I advised him that usually they get better with time. He was also laying down for the  remainder of the day after his first set. I advised him to go for a walk or do squats to help with the soreness. Also advised to use a warm compress on the site to help with pain.   Administered cabotegravir 61m/3mL in left upper outer quadrant of the gluteal muscle. Administered rilpivirine 900 mg/346min the right upper outer quadrant of the gluteal muscle. No issues with injections.  He will follow up in 2 months for next set of injections.  Plan: - Cabenuva injections administered - HIV RNA today - Next injections scheduled for 01/03/22 with Dr. Gale Journey and 03/06/22 with me - Call with any issues or questions  Julliana Whitmyer L. Tanekia Ryans, PharmD, BCIDP, AAHIVP, [place] Clinical Pharmacist Practitioner [facility]

## 2021-11-06 LAB — HIV-1 RNA QUANT-NO REFLEX-BLD
HIV 1 RNA Quant: NOT DETECTED Copies/mL
HIV-1 RNA Quant, Log: NOT DETECTED Log cps/mL

## 2021-12-01 ENCOUNTER — Ambulatory Visit: Payer: Self-pay | Admitting: Internal Medicine

## 2021-12-04 ENCOUNTER — Ambulatory Visit: Payer: Self-pay | Admitting: Internal Medicine

## 2021-12-05 ENCOUNTER — Telehealth: Payer: Self-pay

## 2021-12-05 ENCOUNTER — Encounter (HOSPITAL_COMMUNITY): Payer: Self-pay | Admitting: Emergency Medicine

## 2021-12-05 ENCOUNTER — Emergency Department (HOSPITAL_COMMUNITY)
Admission: EM | Admit: 2021-12-05 | Discharge: 2021-12-05 | Disposition: A | Discharge status: Home / Self Care | Payer: Self-pay | Attending: Emergency Medicine | Admitting: Emergency Medicine

## 2021-12-05 ENCOUNTER — Other Ambulatory Visit: Payer: Self-pay

## 2021-12-05 DIAGNOSIS — Z21 Asymptomatic human immunodeficiency virus [HIV] infection status: Secondary | ICD-10-CM | POA: Insufficient documentation

## 2021-12-05 DIAGNOSIS — L237 Allergic contact dermatitis due to plants, except food: Secondary | ICD-10-CM | POA: Insufficient documentation

## 2021-12-05 DIAGNOSIS — Z7982 Long term (current) use of aspirin: Secondary | ICD-10-CM | POA: Insufficient documentation

## 2021-12-05 MED ORDER — METHYLPREDNISOLONE 4 MG PO TBPK: ORAL_TABLET | ORAL | 0 refills | Status: DC

## 2021-12-05 MED ORDER — CITALOPRAM HYDROBROMIDE 20 MG PO TABS: 20.0000 mg | ORAL_TABLET | Freq: Every day | ORAL | 0 refills | Status: DC

## 2021-12-05 MED ORDER — FAMOTIDINE 20 MG PO TABS: 20.0000 mg | ORAL_TABLET | Freq: Two times a day (BID) | ORAL | 0 refills | Status: DC

## 2021-12-05 NOTE — Discharge Instructions (Signed)
I have given you a prescription for 2 medications for management of your rash. Please take these as prescribed in their entirety. Follow-up with your PCP as needed.   Return if development of any new or worsening symptoms

## 2021-12-05 NOTE — ED Provider Notes (Signed)
[facility] EMERGENCY DEPARTMENT Provider Note   CSN: [id number] Arrival date & time: 12/05/21  0736     History  Chief Complaint  Patient presents with   Poison Ivy    Mason Morales is a 66 y.o. male.  Patient with history of HIV, GERD, htn presents today with complaints of rash. He states that 2 weeks ago he was working outside and was exposed to poison ivy and has had an unbearable itchy rash since then. He states he called his PCP to schedule an appointment to get steroids, however they told him they could not see him for 2 weeks. States that he has tried calamine lotion and topical steroids OTC without relief. Denies fever or chills. No hx of diabetes.  The history is provided by the patient. No language interpreter was used.  Poison Ivy       Home Medications Prior to Admission medications   Medication Sig Start Date End Date Taking? Authorizing Provider  acetaminophen (TYLENOL) 325 MG tablet Take 2 tablets (650 mg total) by mouth every 6 (six) hours as needed. Do not take more than 4040m of tylenol per day 08/07/17   Couture, Cortni S, PA-C  amLODipine (NORVASC) 10 MG tablet Take 1 tablet (10 mg total) by mouth daily. 07/07/21   JBiagio Borg MD  aspirin 81 MG EC tablet Take 1 tablet (81 mg total) by mouth daily. Swallow whole. Patient not taking: Reported on 06/28/2021 03/18/12   JBiagio Borg MD  cabotegravir & rilpivirine ER (CABENUVA) 600 & 900 MG/3ML injection Inject 1 kit into the muscle every 2 (two) months. 09/28/21   Vu, TRockey Situ MD  cabotegravir & rilpivirine ER (CABENUVA) 600 & 900 MG/3ML injection Inject 1 kit into the muscle every 30 (thirty) days. 09/28/21   Vu, TJohnny BridgeT, MD  citalopram (CELEXA) 20 MG tablet Take 1 tablet (20 mg total) by mouth daily. 11/30/20   JBiagio Borg MD  Darunavir-Cobicistat-Emtricitabine-Tenofovir Alafenamide ([facility] 800-150-200-10 MG TABS Take 1 tablet by mouth daily with breakfast. 06/07/21   VTommy Medal CLavell Islam MD   dicyclomine (BENTYL) 20 MG tablet Take 1 tablet (20 mg total) by mouth 2 (two) times daily. Patient not taking: Reported on 06/28/2021 04/02/18   JBiagio Borg MD  Lactulose 20 GM/30ML SOLN 30 ml by mouth up to three times daily Patient not taking: Reported on 06/28/2021 07/22/19   JBiagio Borg MD  meloxicam (MOBIC) 15 MG tablet Take 1 tablet (15 mg total) by mouth daily as needed for pain. Patient not taking: Reported on 06/28/2021 04/02/18   JBiagio Borg MD  mupirocin cream (BACTROBAN) 2 % Apply 1 application topically 2 (two) times daily. Patient not taking: Reported on 06/28/2021 12/28/20   PRobert Bellow PA-C  omeprazole (PRILOSEC) 20 MG capsule Take 1 capsule (20 mg total) by mouth daily as needed (for heartburn). Patient not taking: Reported on 06/28/2021 08/16/16   JBiagio Borg MD      Allergies    Gadolinium derivatives, Sulfur dioxide, Isovue [iopamidol], and Sulfa antibiotics    Review of Systems   Review of Systems  Skin:  Positive for rash.  All other systems reviewed and are negative.   Physical Exam Updated Vital Signs BP (!) 138/91 (BP Location: Right Arm)   Pulse 83   Temp 98.5 F (36.9 C) (Oral)   Resp 16   SpO2 99%  Physical Exam Vitals and nursing note reviewed.  Constitutional:  General: He is not in acute distress.    Appearance: Normal appearance. He is normal weight. He is not ill-appearing, toxic-appearing or diaphoretic.  HENT:     Head: Normocephalic and atraumatic.  Cardiovascular:     Rate and Rhythm: Normal rate.  Pulmonary:     Effort: Pulmonary effort is normal. No respiratory distress.  Musculoskeletal:        General: Normal range of motion.     Cervical back: Normal range of motion.  Skin:    General: Skin is warm and dry.     Comments: Diffuse urticarial rash consistent with poison ivy present on the patients bilateral arms and chest. No drainage or infectious signs  Neurological:     General: No focal deficit present.     Mental  Status: He is alert.  Psychiatric:        Mood and Affect: Mood normal.        Behavior: Behavior normal.     ED Results / Procedures / Treatments   Labs (all labs ordered are listed, but only abnormal results are displayed) Labs Reviewed - No data to display  EKG None  Radiology No results found.  Procedures Procedures    Medications Ordered in ED Medications - No data to display  ED Course/ Medical Decision Making/ A&P                           Medical Decision Making  Patient presents today with 2 weeks of itchy rash after poison ivy exposure. Rash consistent with poison ivy contact dermatitis. Patient denies any difficulty breathing or swallowing.  Pt has a patent airway without stridor and is handling secretions without difficulty; no angioedema. No blisters, no pustules, no warmth, no draining sinus tracts, no superficial abscesses, no bullous impetigo, no vesicles, no desquamation, no target lesions with dusky purpura or a central bulla. Not tender to touch. No concern for superimposed infection. No concern for SJS, TEN, TSS, tick borne illness, syphilis or other life-threatening condition. Will discharge home with short course of steroids, pepcid and recommend Benadryl as needed for pruritis. Patient is understanding and amenable with plan, educated on red flag symptoms that would prompt immediate return. Discharged in stable condition.   Final Clinical Impression(s) / ED Diagnoses Final diagnoses:  Poison ivy dermatitis    Rx / DC Orders ED Discharge Orders          Ordered    methylPREDNISolone (MEDROL DOSEPAK) 4 MG TBPK tablet  Status:  Discontinued        12/05/21 0936    famotidine (PEPCID) 20 MG tablet  2 times daily,   Status:  Discontinued        12/05/21 0936    famotidine (PEPCID) 20 MG tablet  2 times daily        12/05/21 0937    methylPREDNISolone (MEDROL DOSEPAK) 4 MG TBPK tablet        12/05/21 3716          An After Visit Summary was  printed and given to the patient.     Nestor Lewandowsky 12/05/21 [id number]    Sherwood Gambler, MD 12/05/21 [phone]

## 2021-12-05 NOTE — ED Notes (Signed)
Pt here with poison ivy on arms, chest, shoulders, abd, private area.  Using lotion with no relief - started 2 weeks ago and has had no relief.  Triage APP saw pts - no other complaints.

## 2021-12-05 NOTE — Telephone Encounter (Signed)
Please advice on pt request for Citalopram 20mg  refill since he had not been seen for a year now. Did talk to pt to let them know about needing to be seen for any future refills

## 2021-12-05 NOTE — ED Triage Notes (Signed)
Pt comes in for eval of poison oak/ivy to bilateral arms x 2 weeks.  Cannot get appt with PCP for 2 weeks.  Itching is unbearable.  Pt has tried otc remedies without success.

## 2021-12-05 NOTE — Telephone Encounter (Signed)
Ok done x 30 days, needs rov

## 2021-12-14 ENCOUNTER — Telehealth: Payer: Self-pay | Admitting: Internal Medicine

## 2021-12-14 NOTE — Telephone Encounter (Signed)
Patient need citalopram 20 mg - quantity 30  Please send to pharmacy.

## 2021-12-19 ENCOUNTER — Ambulatory Visit: Payer: Self-pay | Admitting: Internal Medicine

## 2021-12-20 NOTE — Telephone Encounter (Signed)
Spoke to pt about medication been sent and need to be seen for any further refills and advice pt I can go ahead and schedule one for him. Pt sated that he will call tomorrow to schedule one

## 2021-12-27 ENCOUNTER — Telehealth: Payer: Self-pay | Admitting: Pharmacist

## 2021-12-27 NOTE — Telephone Encounter (Signed)
Patient's medication (Cabenuva) has been delivered to [facility] from [facility] and will be administered at patient's next office visit on 01/03/22.  Karam Dunson L. Jossilyn Benda, PharmD [facility] Clinical Pharmacist Practitioner  

## 2022-01-03 ENCOUNTER — Encounter: Payer: Self-pay | Admitting: Internal Medicine

## 2022-01-09 ENCOUNTER — Encounter: Payer: Self-pay | Admitting: Internal Medicine

## 2022-01-09 ENCOUNTER — Other Ambulatory Visit: Payer: Self-pay

## 2022-01-09 ENCOUNTER — Ambulatory Visit (INDEPENDENT_AMBULATORY_CARE_PROVIDER_SITE_OTHER): Payer: Self-pay | Admitting: Internal Medicine

## 2022-01-09 VITALS — BP 164/97 | HR 75 | Temp 97.4°F | Resp 16 | Ht 69.0 in | Wt 159.0 lb

## 2022-01-09 DIAGNOSIS — B20 Human immunodeficiency virus [HIV] disease: Secondary | ICD-10-CM

## 2022-01-09 DIAGNOSIS — Z113 Encounter for screening for infections with a predominantly sexual mode of transmission: Secondary | ICD-10-CM

## 2022-01-09 DIAGNOSIS — Z23 Encounter for immunization: Secondary | ICD-10-CM

## 2022-01-09 MED ORDER — ZOSTER VAC RECOMB ADJUVANTED 50 MCG/0.5ML IM SUSR: 0.5000 mL | Freq: Once | INTRAMUSCULAR | 1 refills | Status: AC

## 2022-01-09 MED ORDER — CABOTEGRAVIR & RILPIVIRINE ER 600 & 900 MG/3ML IM SUER
1.0000 | Freq: Once | INTRAMUSCULAR | Status: AC
  Administered 2022-01-09: 1 via INTRAMUSCULAR

## 2022-01-09 NOTE — Addendum Note (Signed)
Addended by: Tomi Bamberger on: 01/09/2022 11:03 AM   Modules accepted: Orders

## 2022-01-09 NOTE — Progress Notes (Signed)
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 male htn, gerd, kidney stones, previously on appretude study who acquired HIV and referred here for new hiv patient management   He has been in the appretude study with Dr Lucianne Lei Dam/[facility] (hptn 084 study). He had transitioned to commercial appretude after the study  He was infected with monkey pox/treated in 12/2020  It appears he failed to get one injection of apretude in 03/2021. He presented to the ER 12/29 with flu like symptoms. He returned to [facility] 04/2021 with viral load 246 although hiv screen was negative. Rapid start initiated with 2nd viral load of 2610 the same month. His initial reflex genotype/integrase testing showed no resistance.  He saw dr Drucilla Schmidt 2/23 and had an archive genotype ordered  He has been on symtuza. No missed dose last 4 weeks. Tolerate it well.  Other meds: Celexa Amlodipine Bentyl Mobic Asa Prednisone   He had syphilis infection hx. Rpr has been stable at 1:2. He was given early latent treatment 2 months prior to this visit (04/2021), and years ago also for early latent (never treated as  complicated syphilis or late latent syphilis)   Patient is bi-sexual, but doesn't do receptive anal sex. 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, Insurance risk surveyor No substance use; occasional thc (used for anxiety and panic attack) Hobbies -- professional equestrian; enjoy dogs pet; tennis; skiing  Recent infectious disease serology: 05/09/2021 Rpr 1:2 -- has been at this level for the past several years dating back to 2018 Hep a Ab nr; hep b sAb reactive; hep b cAb reactive; hep b sAg NR; hep c Ab nr Hiv rna 2610; hiv antibody negative Hiv genotype -- no bictegravir, carbotegravir, eltegravir resistance; RT RAM r211a, without any phenotypic resistance; PR RAM e35d and v77i, without protease inhibitor resistance Quantiferon gold negative  06/07/2021 Hiv rna negative; cd4 966 Hiv ab negative Triple screen gc/chlamydia negative  09/28/21 id clinic f/u Sex 10 days ago broke condom-- that person has gonorrhea (texted him). No sx. Wants testing. No oral/anal receptive sex Doing well Missed a dose of symtuza the last several weeks No complaint  Wants to get back on cabenuva   01/09/22 id clinic f/u Genotype testing with hiv transmission while on cabenuva done --> no ISI resistance or rilpivirine resistance He had restarted cabenuva 09/28/2021; his last shot was about almost 6 weeks ago 11/02/21 virologic remains undetectable No complaint today  No recent sexual exposure     Review of Systems: ROS All other ROS negative  Past Medical History:  Diagnosis Date   Allergic rhinitis, cause unspecified 10/26/2010   Allergy    DDD (degenerative disc disease), cervical 10/26/2010   DDD (degenerative disc disease), lumbar 10/26/2010   Depression    Generalized headaches    GERD (gastroesophageal reflux disease)    HIV disease ([place]) 06/06/2021   HTN (hypertension)  10/26/2010   Hypertension    Kidney stones    Panic attack    Vaccine counseling 06/06/2021    Social History   Tobacco Use   Smoking status: Former    Types: Cigarettes    Quit date: 03/20/2014    Years since quitting: 7.8   Smokeless tobacco: Never  Vaping Use   Vaping Use: Never used  Substance Use Topics   Alcohol use: Yes    Alcohol/week: 6.0 standard drinks of alcohol    Types: 6 Cans of beer per week    Comment: occasionally   Drug use: Yes    Types: Marijuana    Comment: THC-daily    Family History  Problem Relation Age of Onset   Hyperlipidemia Other    Heart disease Other    Stroke Other    Hypertension Other    Diabetes Other    Cancer Other        prostate cancer   Diabetes Other    Alcohol abuse Other    Cancer Other        breast cancer   Stroke Other    Colon cancer Neg Hx    Esophageal cancer Neg Hx    Pancreatic cancer Neg Hx    Rectal cancer Neg Hx    Stomach cancer Neg Hx     Allergies  Allergen Reactions   Gadolinium Derivatives Hives   Sulfur Dioxide Nausea And Vomiting   Isovue [Iopamidol] Hives and Itching   Sulfa Antibiotics Nausea And Vomiting    OBJECTIVE: Vitals:   01/09/22 1006  Resp: 16  Weight: 159 lb (72.1 kg)  Height: 5' 9"  (1.753 m)   Body mass index is 23.48 kg/m.   Physical Exam General/constitutional: no distress, pleasant HEENT: Normocephalic, PER, Conj Clear, EOMI, Oropharynx clear Neck supple CV: rrr no mrg Lungs: clear to auscultation, normal respiratory effort Abd: Soft, Nontender Ext: no edema Skin: No Rash Neuro: nonfocal MSK: no peripheral joint swelling/tenderness/warmth; back spines nontender         Lab: Lab Results  Component Value Date   WBC 5.7 09/28/2021   HGB 13.6 09/28/2021   HCT 40.1 09/28/2021   MCV 91.6 09/28/2021   PLT 246 33/54/5625   Last metabolic panel Lab Results  Component Value Date   GLUCOSE 121 (H) 09/28/2021   NA 139 09/28/2021   K 3.4 (L) 09/28/2021    CL 102 09/28/2021   CO2 27 09/28/2021   BUN 10 09/28/2021   CREATININE 1.19 09/28/2021   EGFR 69 09/28/2021   CALCIUM 9.5 09/28/2021   PHOS 2.1 (L) 08/20/2019   PROT 7.9 09/28/2021   ALBUMIN 4.5 04/02/2018   BILITOT 0.5 09/28/2021   ALKPHOS 82 04/02/2018   AST 16 09/28/2021   ALT 10 09/28/2021   ANIONGAP 6 04/20/2021     HIV: 10/2021       <20 09/2021       <20   Microbiology:  Serology:  Imaging:   Assessment/plan: Problem List Items Addressed This Visit       Other   HIV disease ([place]) - Primary   Relevant Medications   Zoster Vaccine  Adjuvanted [facility]) injection   Other Relevant Orders   HIV-1 RNA quant-no reflex-bld   RPR   Hepatitis C Antibody   Other Visit Diagnoses     Screening for STDs (sexually transmitted diseases)       Relevant Orders   RPR   Hepatitis C Antibody   Urine cytology ancillary only([place])   Cytology (oral, anal, urethral) ancillary only   Need for zoster vaccination           #hiv Appears to have an acute retroviral syndrome early 04/2021 after he missed dose of appretude 03/2021 (only 1 missed dose) His viral load was 2600 when a genotype was done and showed no rilpivirine mutation or cabotegravir mutation I am unclear if at this time his viral load undetectable an archived genotype would be helpful  He was started on symtuza since 05/2021 and is currently undetectable  He prefers injectable for treatment   It is unclear why he contracted hiv given the duration of appretude and no evidence cabutegravir resistance (albeit no archive genotype available)  If archive is not able to be done, could consider dovato/biktarvy or cabenuva as next medication given improved DDI profile over symtuza. Will discuss this with Dr Tommy Medal. Close monitoring would be required initially given he contracted hiv while on appretude to detect integrase strand inhibitor resistance  Archive monogram 2/15 was negative for integrase  resistance  He has been on symtuza but wants go go back on injection  Back on cabenuva in June. Remains well controlled on it He is on q-18month injection     -discussed u=u -encourage compliance -continue current HIV medication -labs 2 months with nurse visit for his routine cabenva shot (one shot given today) -f/u in 6 months with me   #hx syphilis Rpr serofast at 1:2 to 1:1 since 2018. 1980s treatment once as early latent   #hcm -vaccination Shingrix -- rx given today 01/09/22 Meningococcal -- will review S/p prevnar20 05/2021 Zoster -- rx given 2 shot series; patient to bring rx here and nursing can help administer (2nd shot 2-6 months from first shot) Tdap 2018 -hepatitis Prior hep b infection/immune 04/2021 hep c ab negative -- rescreen today -std screen negative 06/07/2021 -annual tb screen negative 05/2021 -cancer screening  Colonoscopy negative 2021 (polyps noncancer) - due every 5 years for now Prostate psa screening previously negative - will see his pcp again this year      Follow-up: Return in about 6 months (around 07/10/2022).  TJabier Mutton MCharles Townfor [phone]   [phone] 01/09/2022, 10:07 AM

## 2022-01-09 NOTE — Patient Instructions (Addendum)
Will get blood tests and std screen labs today  Will give your cabenuva injection today; please schedule nurse/pharmacy visit for cabenuva injection 2 months and 4 months from now   See me in 6 months   Shingle vaccine is given -- this is a 2shot series (2-6 months apart); bring your shingle vaccine here and our nurse can administer for you

## 2022-01-10 LAB — URINE CYTOLOGY ANCILLARY ONLY
Chlamydia: NEGATIVE
Comment: NEGATIVE
Comment: NEGATIVE
Comment: NORMAL
Neisseria Gonorrhea: NEGATIVE
Trichomonas: NEGATIVE

## 2022-01-10 LAB — CYTOLOGY, (ORAL, ANAL, URETHRAL) ANCILLARY ONLY
Chlamydia: NEGATIVE
Comment: NEGATIVE
Comment: NORMAL
Neisseria Gonorrhea: NEGATIVE

## 2022-01-16 LAB — RPR: RPR Ser Ql: REACTIVE — AB

## 2022-01-16 LAB — HEPATITIS C ANTIBODY: Hepatitis C Ab: NONREACTIVE

## 2022-01-16 LAB — RPR TITER: RPR Titer: 1:1 {titer} — ABNORMAL HIGH

## 2022-01-16 LAB — HIV-1 RNA QUANT-NO REFLEX-BLD
HIV 1 RNA Quant: NOT DETECTED Copies/mL
HIV-1 RNA Quant, Log: NOT DETECTED Log cps/mL

## 2022-01-16 LAB — FLUORESCENT TREPONEMAL AB(FTA)-IGG-BLD: Fluorescent Treponemal ABS: REACTIVE — AB

## 2022-01-29 ENCOUNTER — Telehealth: Payer: Self-pay

## 2022-01-29 NOTE — Telephone Encounter (Signed)
MEDICATION:  citalopram (CELEXA) 20 MG tablet  PHARMACY:[id number] - [place], [place] - [address] AT [place] [address]  Comments: Patient is completely out   **Let patient know to contact pharmacy at the end of the day to make sure medication is ready. **  ** Please notify patient to allow 48-72 hours to process**  **Encourage patient to contact the pharmacy for refills or they can request refills through [facility]**

## 2022-01-30 MED ORDER — CITALOPRAM HYDROBROMIDE 20 MG PO TABS: 20.0000 mg | ORAL_TABLET | Freq: Every day | ORAL | 0 refills | Status: DC

## 2022-01-30 NOTE — Telephone Encounter (Signed)
30 day supply sent to pharmacy until next office visit. Patient notified

## 2022-02-23 ENCOUNTER — Ambulatory Visit: Payer: Self-pay | Admitting: Internal Medicine

## 2022-02-28 ENCOUNTER — Other Ambulatory Visit: Payer: Self-pay | Admitting: Internal Medicine

## 2022-03-06 ENCOUNTER — Other Ambulatory Visit: Payer: Self-pay

## 2022-03-06 ENCOUNTER — Ambulatory Visit (INDEPENDENT_AMBULATORY_CARE_PROVIDER_SITE_OTHER): Payer: Self-pay | Admitting: Pharmacist

## 2022-03-06 ENCOUNTER — Ambulatory Visit (INDEPENDENT_AMBULATORY_CARE_PROVIDER_SITE_OTHER): Payer: Self-pay

## 2022-03-06 DIAGNOSIS — Z23 Encounter for immunization: Secondary | ICD-10-CM

## 2022-03-06 DIAGNOSIS — B20 Human immunodeficiency virus [HIV] disease: Secondary | ICD-10-CM

## 2022-03-06 MED ORDER — CABOTEGRAVIR & RILPIVIRINE ER 600 & 900 MG/3ML IM SUER
1.0000 | Freq: Once | INTRAMUSCULAR | Status: AC
  Administered 2022-03-06: 1 via INTRAMUSCULAR

## 2022-03-06 NOTE — Progress Notes (Signed)
HPI: Mason Morales is a 66 y.o. male who presents to the [place] clinic for Purdin administration.  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   Chronic constipation 07/22/2019   Left lumbar radiculopathy 03/26/2017   Acute sinus infection 12/14/2016   Pelvic pain 08/24/2016   Left cervical radiculopathy 05/20/2015   Anxiety state 05/20/2015   GERD (gastroesophageal reflux disease) 10/26/2010   Allergic rhinitis 10/26/2010   HTN (hypertension) 10/26/2010   Kidney stones 10/26/2010   DDD (degenerative disc disease), cervical 10/26/2010   DDD (degenerative disc disease), lumbar 10/26/2010   Left shoulder pain 10/26/2010   Preventative health care 10/26/2010    Patient's Medications  New Prescriptions   No medications on file  Previous Medications   ACETAMINOPHEN (TYLENOL) 325 MG TABLET    Take 2 tablets (650 mg total) by mouth every 6 (six) hours as needed. Do not take more than 4049m of tylenol per day   AMLODIPINE (NORVASC) 10 MG TABLET    Take 1 tablet (10 mg total) by mouth daily.   ASPIRIN 81 MG EC TABLET    Take 1 tablet (81 mg total) by mouth daily. Swallow whole.   CABOTEGRAVIR & RILPIVIRINE ER (CABENUVA) 600 & 900 MG/3ML INJECTION    Inject 1 kit into the muscle every 2 (two) months.   CABOTEGRAVIR & RILPIVIRINE ER (CABENUVA) 600 & 900 MG/3ML INJECTION    Inject 1 kit into the muscle every 30 (thirty) days.   CITALOPRAM (CELEXA) 20 MG TABLET    TAKE 1 TABLET(20 MG) BY MOUTH DAILY   DARUNAVIR-COBICISTAT-EMTRICITABINE-TENOFOVIR ALAFENAMIDE (SYMTUZA) 800-150-200-10 MG TABS    Take 1 tablet by mouth daily with breakfast.   DICYCLOMINE (BENTYL) 20 MG TABLET    Take 1 tablet (20 mg total) by mouth 2 (two) times daily.   FAMOTIDINE (PEPCID) 20 MG TABLET    Take 1 tablet (20 mg total) by mouth 2 (two) times daily.    LACTULOSE 20 GM/30ML SOLN    30 ml by mouth up to three times daily   MELOXICAM (MOBIC) 15 MG TABLET    Take 1 tablet (15 mg total) by mouth daily as needed for pain.   METHYLPREDNISOLONE (MEDROL DOSEPAK) 4 MG TBPK TABLET    Take as directed on package   MUPIROCIN CREAM (BACTROBAN) 2 %    Apply 1 application topically 2 (two) times daily.   OMEPRAZOLE (PRILOSEC) 20 MG CAPSULE    Take 1 capsule (20 mg total) by mouth daily as needed (for heartburn).  Modified Medications   No medications on file  Discontinued Medications   No medications on file    Allergies: Allergies  Allergen Reactions   Gadolinium Derivatives Hives   Sulfur Dioxide Nausea And Vomiting   Isovue [Iopamidol] Hives and Itching   Sulfa Antibiotics Nausea And Vomiting    Past Medical History: Past Medical History:  Diagnosis Date   Allergic rhinitis, cause unspecified 10/26/2010   Allergy    DDD (degenerative disc disease), cervical 10/26/2010   DDD (degenerative disc disease), lumbar 10/26/2010   Depression    Generalized headaches    GERD (gastroesophageal reflux disease)    HIV disease ([place] 06/06/2021   HTN (hypertension) 10/26/2010   Hypertension    Kidney stones    Panic attack    Vaccine counseling 06/06/2021    Social  History: Social History   Socioeconomic History   Marital status: Single    Spouse name: Not on file   Number of children: Not on file   Years of education: 14   Highest education level: Not on file  Occupational History   Occupation: Tourist information centre manager    Employer: [employer]  Tobacco Use   Smoking status: Former    Types: Cigarettes    Quit date: 03/20/2014    Years since quitting: 7.9   Smokeless tobacco: Never  Vaping Use   Vaping Use: Never used  Substance and Sexual Activity   Alcohol use: Yes    Alcohol/week: 6.0 standard drinks of alcohol    Types: 6 Cans of beer per week    Comment: occasionally   Drug use: Yes    Types: Marijuana    Comment: THC-daily    Sexual activity: Yes    Comment: declined condoms  Other Topics Concern   Not on file  Social History Narrative   Not on file   Social Determinants of Health   Financial Resource Strain: Not on file  Food Insecurity: Not on file  Transportation Needs: Not on file  Physical Activity: Not on file  Stress: Not on file  Social Connections: Not on file    Labs: Lab Results  Component Value Date   HIV1RNAQUANT Not Detected 01/09/2022   HIV1RNAQUANT Not Detected 11/02/2021   HIV1RNAQUANT Not Detected 09/28/2021    RPR and STI Lab Results  Component Value Date   LABRPR REACTIVE (A) 01/09/2022   LABRPR REACTIVE (A) 09/28/2021   LABRPR REACTIVE (A) 06/07/2021   LABRPR REACTIVE (A) 05/09/2021   LABRPR REACTIVE (A) 02/13/2021   RPRTITER 1:1 (H) 01/09/2022   RPRTITER 1:2 (H) 09/28/2021   RPRTITER 1:2 (H) 06/07/2021   RPRTITER 1:2 (H) 05/09/2021   RPRTITER 1:2 (H) 02/13/2021    STI Results GC GC CT CT  01/09/2022 10:17 AM Negative    Negative   Negative    Negative    09/28/2021 11:18 AM Negative   Negative    05/09/2021 12:25 PM Negative   Negative    02/13/2021  3:57 PM Negative    Negative   Negative    Negative    12/28/2020  2:31 PM Negative    Negative    Negative   Negative    Negative    Negative    11/08/2016  8:15 AM  NOT DETECTED   NOT DETECTED     Hepatitis B Lab Results  Component Value Date   HEPBSAB REACTIVE (A) 05/09/2021   HEPBSAG NON-REACTIVE 05/09/2021   HEPBCAB REACTIVE (A) 05/09/2021   Hepatitis C Lab Results  Component Value Date   HEPCAB NON-REACTIVE 01/09/2022   Hepatitis A Lab Results  Component Value Date   HAV NON-REACTIVE 05/09/2021   Lipids: Lab Results  Component Value Date   CHOL 200 (H) 05/09/2021   TRIG 120 05/09/2021   HDL 60 05/09/2021   CHOLHDL 3.3 05/09/2021   VLDL 27.0 04/02/2018   LDLCALC 117 (H) 05/09/2021    TARGET DATE: 15 th of each month   Assessment: Mason Morales presents today for his maintenance  Cabenuva injections. Past injections were tolerated well without issues. One new partner since last STI screening in September  2023 which was negative. Deferred STI screening today as he does not have symptoms. Patient offered condoms in clinic today and politely declined.   Administered cabotegravir 63m/3mL in left upper outer quadrant of the gluteal muscle. Administered  rilpivirine 900 mg/89m in the right upper outer quadrant of the gluteal muscle. No issues with injections. He will follow up in 2 months for next set of injections.  COVID and flu vaccine administered in clinic today. Patient is also eligible for meningococcal and hepatitis A vaccines. However, he wishes to defer these until the next visit. The patient stated he was also interested in receiving the shingles vaccine as his sister recently was diagnosed with shingles. I informed him we do not carry this vaccine in the clinic but that I could send a prescription in for him [facility]    Plan: - Cabenuva injections administered - COVID vaccine administered IM x1 in RD - Flu vaccine administered IM x1 in LD - F/U HIV RNA  - Meningococcal booster and Havrix #1/2 next visit  - Next injections scheduled for 05/07/22 with Cassie  - Call with any issues or questions  AAdria Dill PharmD PGY-2 Infectious Diseases Resident  03/06/2022 10:45 AM

## 2022-03-07 ENCOUNTER — Telehealth: Payer: Self-pay

## 2022-03-07 NOTE — Telephone Encounter (Signed)
[facility] Patient Advocate Encounter  Patient's medication Renaldo Harrison) have been couriered to [facility] from [employer] and was administered on the patient next office visit on 03/06/22.  Clearance [name] , CPhT Specialty Pharmacy Patient [facility] for Infectious Disease Phone: [phone] Fax:  [phone]

## 2022-03-08 ENCOUNTER — Other Ambulatory Visit: Payer: Self-pay | Admitting: Pharmacist

## 2022-03-08 DIAGNOSIS — Z23 Encounter for immunization: Secondary | ICD-10-CM

## 2022-03-08 MED ORDER — ZOSTER VAC RECOMB ADJUVANTED 50 MCG/0.5ML IM SUSR: INTRAMUSCULAR | 1 refills | Status: DC

## 2022-03-09 LAB — HIV-1 RNA QUANT-NO REFLEX-BLD
HIV 1 RNA Quant: NOT DETECTED Copies/mL
HIV-1 RNA Quant, Log: NOT DETECTED Log cps/mL

## 2022-05-07 ENCOUNTER — Encounter: Payer: Self-pay | Admitting: Pharmacist

## 2022-05-08 ENCOUNTER — Telehealth: Payer: Self-pay

## 2022-05-08 NOTE — Telephone Encounter (Signed)
[facility] Patient Advocate Encounter  Patient's medications(Cabenuva) have been couriered to [facility] from [employer] and will be administered on the patient next office visit.  Ileene Patrick , [place] [facility] Patient [facility] for Infectious Disease Phone: [phone] Fax:  [phone]

## 2022-06-06 ENCOUNTER — Encounter: Payer: Self-pay | Admitting: Internal Medicine

## 2022-06-06 ENCOUNTER — Ambulatory Visit (INDEPENDENT_AMBULATORY_CARE_PROVIDER_SITE_OTHER): Payer: Commercial Managed Care - HMO | Admitting: Internal Medicine

## 2022-06-06 ENCOUNTER — Other Ambulatory Visit: Payer: Self-pay

## 2022-06-06 VITALS — BP 136/78 | HR 104 | Temp 97.8°F | Wt 155.0 lb

## 2022-06-06 DIAGNOSIS — Z113 Encounter for screening for infections with a predominantly sexual mode of transmission: Secondary | ICD-10-CM | POA: Diagnosis not present

## 2022-06-06 DIAGNOSIS — E785 Hyperlipidemia, unspecified: Secondary | ICD-10-CM | POA: Diagnosis not present

## 2022-06-06 DIAGNOSIS — Z1159 Encounter for screening for other viral diseases: Secondary | ICD-10-CM | POA: Diagnosis not present

## 2022-06-06 DIAGNOSIS — B20 Human immunodeficiency virus [HIV] disease: Secondary | ICD-10-CM

## 2022-06-06 MED ORDER — ATORVASTATIN CALCIUM 40 MG PO TABS: 40.0000 mg | ORAL_TABLET | Freq: Every day | ORAL | 11 refills | Status: DC

## 2022-06-06 NOTE — Patient Instructions (Addendum)
Urine/oral swab and blood tests today  Continue your symtuza  Will let our pharmcy team know to set you up to restart cabenuva   See me in 3 months otherwise  Start lipitor 40 mg daily to reduce risk of heart attack/stroke. Take half a tablet of this while you are still on symtuza. Once you get off symtuza, you can take a full tablet once a day   Make sure you see your pcp to get all age-appropriate cancer screening done

## 2022-06-06 NOTE — Progress Notes (Signed)
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 male htn, gerd, kidney stones, previously on appretude study who acquired HIV while taking appretude, here for f/u hiv care  06/2021 id clinic visit He has been in the appretude study with Dr Lucianne Lei Dam/[facility] (hptn 084 study). He had transitioned to commercial appretude after the study  He was infected with monkey pox/treated in 12/2020  It appears he failed to get one injection of apretude in 03/2021. He presented to the ER 12/29 with flu like symptoms. He returned to [facility] 04/2021 with viral load 246 although hiv screen was negative. Rapid start initiated with 2nd viral load of 2610 the same month. His initial reflex genotype/integrase testing showed no resistance.  He saw dr Drucilla Schmidt 2/23 and had an archive genotype ordered  He has been on symtuza. No missed dose last 4 weeks. Tolerate it well.  Other meds: Celexa Amlodipine Bentyl Mobic Asa Prednisone   He had syphilis infection hx. Rpr has been stable at 1:2. He was given early latent treatment 2 months prior to this visit (04/2021), and years ago also for early latent (never  treated as complicated syphilis or late latent syphilis)   Patient is bi-sexual, but doesn't do receptive anal sex. 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, Insurance risk surveyor No substance use; occasional thc (used for anxiety and panic attack) Hobbies -- professional equestrian; enjoy dogs pet; tennis; skiing  Recent infectious disease serology: 05/09/2021 Rpr 1:2 -- has been at this level for the past several years dating back to 2018 Hep a Ab nr; hep b sAb reactive; hep b cAb reactive; hep b sAg NR; hep c Ab nr Hiv rna 2610; hiv antibody negative Hiv genotype -- no bictegravir, carbotegravir, eltegravir resistance; RT RAM r211a, without any phenotypic resistance; PR RAM e35d and v77i, without protease inhibitor resistance Quantiferon gold negative  06/07/2021 Hiv rna negative; cd4 966 Hiv ab negative Triple screen gc/chlamydia negative  09/28/21 id clinic f/u Sex 10 days ago broke condom-- that person has gonorrhea (texted him). No sx. Wants testing. No oral/anal receptive sex Doing well Missed a dose of symtuza the last several weeks No complaint  Wants to get back on cabenuva   01/09/22 id clinic f/u Genotype testing with hiv transmission while on cabenuva done --> no ISI resistance or rilpivirine resistance He had restarted cabenuva 09/28/2021; his last shot was about almost 6 weeks ago 11/02/21 virologic remains undetectable No complaint today  No recent sexual exposure   06/06/22 id clinic f/u Patient had missed his last q58monthcabenuva injection;  he last received it 03/06/2022 He has been taking symtuza in place of cabenuva. He wants to get back to cabenuva  He is functionally cured for hep b He wants to continue cabenuva -- discuss lack of antihep b activity and he is ok. No clear risk for reactivation  Stressed out preparing for horse competition. Father  dementia passed away 3 years ago Not suicidal or homicidal  Social - not sexually active (last encounter 1 month). No anal receptive sex. Retired English as a second language teacher. No current job  No health concern  No f/c, weight loss, cough, n/v/diarrhea, rash, joint pain   Review of Systems: ROS All other ROS negative      Past Medical History:  Diagnosis Date   Allergic rhinitis, cause unspecified 10/26/2010   Allergy    DDD (degenerative disc disease), cervical 10/26/2010   DDD (degenerative disc disease), lumbar 10/26/2010   Depression    Generalized headaches    GERD (gastroesophageal reflux disease)    HIV disease ([place]) 06/06/2021   HTN (hypertension) 10/26/2010   Hypertension    Kidney stones    Panic attack    Vaccine counseling 06/06/2021    Social History   Tobacco Use   Smoking status: Former    Types: Cigarettes    Quit date: 03/20/2014    Years since quitting: 8.2   Smokeless tobacco: Never  Vaping Use   Vaping Use: Never used  Substance Use Topics   Alcohol use: Yes    Alcohol/week: 6.0 standard drinks of alcohol    Types: 6 Cans of beer per week    Comment: occasionally   Drug use: Yes    Types: Marijuana    Comment: THC-daily    Family History  Problem Relation Age of Onset   Hyperlipidemia Other    Heart disease Other    Stroke Other    Hypertension Other    Diabetes Other    Cancer Other        prostate cancer   Diabetes Other    Alcohol abuse Other    Cancer Other        breast cancer   Stroke Other    Colon cancer Neg Hx    Esophageal cancer Neg Hx    Pancreatic cancer Neg Hx    Rectal cancer Neg Hx    Stomach cancer Neg Hx     Allergies  Allergen Reactions   Gadolinium Derivatives Hives   Sulfur Dioxide Nausea And Vomiting   Isovue [Iopamidol] Hives and Itching   Sulfa Antibiotics Nausea And Vomiting    OBJECTIVE: Vitals:   06/06/22 1542  Weight: 155 lb (70.3 kg)   Body mass index is 22.89 kg/m.   Physical  Exam General/constitutional: no distress, pleasant HEENT: Normocephalic, PER, Conj Clear, EOMI, Oropharynx clear Neck supple CV: rrr no mrg Lungs: clear to auscultation, normal respiratory effort Abd: Soft, Nontender Ext: no edema Skin: No Rash Neuro: nonfocal MSK: no peripheral joint swelling/tenderness/warmth; back spines nontender           Lab: Lab Results  Component Value Date   WBC 5.7 09/28/2021   HGB 13.6 09/28/2021   HCT 40.1 09/28/2021   MCV 91.6 09/28/2021   PLT 246 [id number]   Last metabolic panel Lab Results  Component Value Date   GLUCOSE 121 (H) 09/28/2021   NA 139 09/28/2021   K 3.4 (L) 09/28/2021   CL 102 09/28/2021   CO2 27 09/28/2021   BUN 10 09/28/2021   CREATININE 1.19 09/28/2021  EGFR 69 09/28/2021   CALCIUM 9.5 09/28/2021   PHOS 2.1 (L) 08/20/2019   PROT 7.9 09/28/2021   ALBUMIN 4.5 04/02/2018   BILITOT 0.5 09/28/2021   ALKPHOS 82 04/02/2018   AST 16 09/28/2021   ALT 10 09/28/2021   ANIONGAP 6 04/20/2021     HIV: 12/2021       <20 10/2021       <20 09/2021       <20   Microbiology:  Serology:  Imaging:   Assessment/plan: Problem List Items Addressed This Visit       Other   HIV disease ([place]) - Primary   Other Visit Diagnoses     Screening for STDs (sexually transmitted diseases)       Hyperlipidemia, unspecified hyperlipidemia type          #hiv Appears to have an acute retroviral syndrome early 04/2021 after he missed dose of appretude 03/2021 (only 1 missed dose) His viral load was 28 when a genotype was done and showed no rilpivirine mutation or cabotegravir mutation I am unclear if at this time his viral load undetectable an archived genotype would be helpful  He was started on symtuza since 05/2021 and is currently undetectable  He prefers injectable for treatment   It is unclear why he contracted hiv given the duration of appretude and no evidence cabutegravir resistance (albeit no archive genotype  available)  If archive is not able to be done, could consider dovato/biktarvy or cabenuva as next medication given improved DDI profile over symtuza. Will discuss this with Dr Tommy Medal. Close monitoring would be required initially given he contracted hiv while on appretude to detect integrase strand inhibitor resistance  Archive monogram 2/15 was negative for integrase resistance  He has been on symtuza but wants go go back on injection  Back on cabenuva in June. Remains well controlled on it He is on q-31month injection   06/06/22 taking symtuza since he missed cabenuva in 04/2022; wants to get back. Prior functionally cured hep b     -discussed u=u -encourage compliance -continue current HIV medication -labs today -f/u in 3 months with me; pharmacy visits for restarting cabenuva  -discuss reprieve -- start patient on lipitor 40 mg    #hx syphilis Rpr serofast at 1:2 to 1:1 since 2018. 1980s treatment once as early latent  -rpr and urine/oral swab today for gc/chlam   #hcm -vaccination Shingrix -- rx given today 01/09/22 Meningococcal -- will review S/p prevnar20 05/2021 Zoster -- rx given 2 shot series; patient to bring rx here and nursing can help administer (2nd shot 2-6 months from first shot) Tdap 2018 -hepatitis Prior hep b infection -- functionally cured; check level today and sAg 12/2021 hep c ab negative -- rescreen today -std screen negative 06/07/2021 -annual tb screen negative 05/2021 -cancer screening  Colonoscopy negative 2021 (polyps noncancer) - due every 5 years for now Prostate psa screening previously negative - will see his pcp again this year -- advise to set this up      Follow-up: No follow-ups on file.  TJabier Mutton MEssexvillefor I[phone]   [phone] 06/06/2022, 3:45 PM

## 2022-06-08 LAB — URINE CYTOLOGY ANCILLARY ONLY
Chlamydia: NEGATIVE
Comment: NEGATIVE
Comment: NEGATIVE
Comment: NORMAL
Neisseria Gonorrhea: NEGATIVE
Trichomonas: NEGATIVE

## 2022-06-08 LAB — CYTOLOGY, (ORAL, ANAL, URETHRAL) ANCILLARY ONLY
Chlamydia: NEGATIVE
Comment: NEGATIVE
Comment: NORMAL
Neisseria Gonorrhea: NEGATIVE

## 2022-06-09 LAB — HIV-1 RNA QUANT-NO REFLEX-BLD
HIV 1 RNA Quant: NOT DETECTED Copies/mL
HIV-1 RNA Quant, Log: NOT DETECTED Log cps/mL

## 2022-06-09 LAB — COMPLETE METABOLIC PANEL WITH GFR
AG Ratio: 1.6 (calc) (ref 1.0–2.5)
ALT: 7 U/L — ABNORMAL LOW (ref 9–46)
AST: 16 U/L (ref 10–35)
Albumin: 4.5 g/dL (ref 3.6–5.1)
Alkaline phosphatase (APISO): 68 U/L (ref 35–144)
BUN: 15 mg/dL (ref 7–25)
CO2: 26 mmol/L (ref 20–32)
Calcium: 9.5 mg/dL (ref 8.6–10.3)
Chloride: 102 mmol/L (ref 98–110)
Creat: 1.32 mg/dL (ref 0.70–1.35)
Globulin: 2.8 g/dL (calc) (ref 1.9–3.7)
Glucose, Bld: 99 mg/dL (ref 65–99)
Potassium: 4 mmol/L (ref 3.5–5.3)
Sodium: 136 mmol/L (ref 135–146)
Total Bilirubin: 0.4 mg/dL (ref 0.2–1.2)
Total Protein: 7.3 g/dL (ref 6.1–8.1)
eGFR: 60 mL/min/{1.73_m2} (ref >=60)

## 2022-06-09 LAB — CBC WITH DIFFERENTIAL/PLATELET
Absolute Monocytes: 250 cells/uL (ref 200–950)
Basophils Absolute: 29 cells/uL (ref 0–200)
Basophils Relative: 0.6 %
Eosinophils Absolute: 0 cells/uL — ABNORMAL LOW (ref 15–500)
Eosinophils Relative: 0 %
HCT: 40.4 % (ref 38.5–50.0)
Hemoglobin: 13.8 g/dL (ref 13.2–17.1)
Lymphs Abs: 1330 cells/uL (ref 850–3900)
MCH: 30 pg (ref 27.0–33.0)
MCHC: 34.2 g/dL (ref 32.0–36.0)
MCV: 87.8 fL (ref 80.0–100.0)
MPV: 10 fL (ref 7.5–12.5)
Monocytes Relative: 5.2 %
Neutro Abs: 3192 cells/uL (ref 1500–7800)
Neutrophils Relative %: 66.5 %
Platelets: 228 10*3/uL (ref 140–400)
RBC: 4.6 10*6/uL (ref 4.20–5.80)
RDW: 13.1 % (ref 11.0–15.0)
Total Lymphocyte: 27.7 %
WBC: 4.8 10*3/uL (ref 3.8–10.8)

## 2022-06-09 LAB — HEPATITIS PANEL, ACUTE
Hep A IgM: NONREACTIVE
Hep B C IgM: NONREACTIVE
Hepatitis B Surface Ag: NONREACTIVE
Hepatitis C Ab: NONREACTIVE

## 2022-06-09 LAB — RPR TITER: RPR Titer: 1:1 {titer} — ABNORMAL HIGH

## 2022-06-09 LAB — SYPHILIS: RPR W/REFLEX TO RPR TITER AND TREPONEMAL ANTIBODIES, TRADITIONAL SCREENING AND DIAGNOSIS ALGORITHM: RPR Ser Ql: REACTIVE — AB

## 2022-06-09 LAB — HEPATITIS B SURFACE ANTIBODY, QUANTITATIVE: Hep B S AB Quant (Post): >1000 m[IU]/mL (ref >=10)

## 2022-06-09 LAB — T PALLIDUM AB: T Pallidum Abs: POSITIVE — AB

## 2022-07-04 ENCOUNTER — Other Ambulatory Visit (HOSPITAL_COMMUNITY): Payer: Self-pay

## 2022-07-05 ENCOUNTER — Telehealth: Payer: Self-pay

## 2022-07-05 ENCOUNTER — Telehealth: Payer: Self-pay | Admitting: Pharmacist

## 2022-07-05 ENCOUNTER — Other Ambulatory Visit (HOSPITAL_COMMUNITY): Payer: Self-pay

## 2022-07-05 NOTE — Telephone Encounter (Signed)
Called patient today letting him know we need to reschedule his appointment for tomorrow to restart Cabenuva as we are experiencing challenges with his insurance. He denies ever signing up for insurance and wants to cancel it. I told him if he cancels his insurance, he needs to let us know because we would need to start another process to get [place] approved.  Right now, his Kern Reap is approved through medical benefits, but he was denied [employer] copay assistance.   Mason Morales, PharmD, CPP, BCIDP, [place] Clinical Pharmacist Practitioner Infectious [facility] for Infectious Disease

## 2022-07-05 NOTE — Telephone Encounter (Signed)
Patient's appointment for tomorrow was cancelled until issues are resolved.

## 2022-07-05 NOTE — Telephone Encounter (Addendum)
[facility] Patient Advocate Encounter  Prior Authorization for [phone] Kern Reap) has been approved.  (Medical Benefits) Patient Approved for 4800 units   PA# [id number] Effective dates: 07/05/22 through 07/04/23     [facility] Clinic will continue to follow.  Ileene Patrick, [facility] Patient [facility] for Infectious Disease Phone: [phone] Fax:  [phone]

## 2022-07-06 ENCOUNTER — Other Ambulatory Visit (HOSPITAL_COMMUNITY): Payer: Self-pay

## 2022-07-06 ENCOUNTER — Ambulatory Visit: Payer: Self-pay | Admitting: Pharmacist

## 2022-07-12 ENCOUNTER — Other Ambulatory Visit (HOSPITAL_COMMUNITY): Payer: Self-pay

## 2022-08-07 ENCOUNTER — Telehealth: Payer: Self-pay | Admitting: Internal Medicine

## 2022-08-07 MED ORDER — CITALOPRAM HYDROBROMIDE 20 MG PO TABS: ORAL_TABLET | ORAL | 0 refills | Status: DC

## 2022-08-07 NOTE — Telephone Encounter (Signed)
LOV 11/30/20, ov is scheduled for May and informed patient to keep his appt in order to obtain refills. I offered an appt sooner but declined due to no insurance or transportation and states he will call EMS

## 2022-08-07 NOTE — Telephone Encounter (Signed)
Prescription Request  08/07/2022  LOV: Visit date not found  What is the name of the medication or equipment? citalopram (CELEXA) 20 MG tablet   amLODipine (NORVASC) 10 MG tablet  Have you contacted your pharmacy to request a refill? No   Which pharmacy would you like this sent to?     [facility] [address] [facility] - [place], [place] - [address] [place] [address] Phone: [phone] Fax: [phone] Patient notified that their request is being sent to the clinical staff for review and that they should receive a response within 2 business days.   Please advise at Mobile [phone] (mobile)   Pt has upcoming appt pt needs a temporary refill he is completely out of his medication.

## 2022-08-07 NOTE — Telephone Encounter (Signed)
Ok done erx, pt to keep appt please

## 2022-08-08 ENCOUNTER — Ambulatory Visit: Payer: Commercial Managed Care - HMO | Admitting: Internal Medicine

## 2022-08-08 ENCOUNTER — Telehealth: Payer: Self-pay | Admitting: Internal Medicine

## 2022-08-08 NOTE — Telephone Encounter (Signed)
Prescription Request  08/08/2022  LOV: Visit date not found  What is the name of the medication or equipment?  citalopram (CELEXA) 20 MG tablet   amLODipine (NORVASC) 10 MG tablet   Pt need the citalopram resent to this pharmacy below medication was sent to the wrong pharmacy.  Have you contacted your pharmacy to request a refill? No   Which pharmacy would you like this sent to?  [facility] [address] [facility] - [place], [place] - [address] [place] [address] Phone: [phone] Fax: [phone]    Patient notified that their request is being sent to the clinical staff for review and that they should receive a response within 2 business days.   Please advise at Mobile [phone] (mobile)

## 2022-08-13 ENCOUNTER — Other Ambulatory Visit: Payer: Self-pay | Admitting: Internal Medicine

## 2022-08-27 ENCOUNTER — Other Ambulatory Visit (HOSPITAL_COMMUNITY): Payer: Self-pay

## 2022-08-28 ENCOUNTER — Telehealth: Payer: Self-pay | Admitting: Pharmacist

## 2022-08-28 ENCOUNTER — Other Ambulatory Visit: Payer: Self-pay | Admitting: Pharmacist

## 2022-08-28 DIAGNOSIS — B20 Human immunodeficiency virus [HIV] disease: Secondary | ICD-10-CM

## 2022-08-28 MED ORDER — SYMTUZA 800-150-200-10 MG PO TABS: 1.0000 | ORAL_TABLET | Freq: Every day | ORAL | 0 refills | Status: DC

## 2022-08-28 MED ORDER — CABOTEGRAVIR & RILPIVIRINE ER 600 & 900 MG/3ML IM SUER: 1.0000 | INTRAMUSCULAR | 1 refills | Status: DC

## 2022-08-28 NOTE — Telephone Encounter (Signed)
Patient's appointment has been cancelled for tomorrow to restart Cabenuva as he did not inform us that his insurance was cancelled. He is active through [employer] still, so sending new Arthur prescription to [facility] in [place] today.  Patient states he is still taking Symtuza every day and has ~10 tablets left. Will send one additional fill of Symtuza through [facility] as a bridge until he restarts [place]. He understood that he needs to answer calls from [facility] so that they will send [place] his Cabenuva injections. Will call him once we actually have his medication on hand to schedule his restart.  Margarite Gouge, PharmD, CPP, BCIDP, AAHIVP Clinical Pharmacist Practitioner Infectious Diseases Clinical Pharmacist [facility] for Infectious Disease

## 2022-08-29 ENCOUNTER — Ambulatory Visit: Payer: Self-pay | Admitting: Internal Medicine

## 2022-08-29 ENCOUNTER — Encounter: Payer: Self-pay | Admitting: Internal Medicine

## 2022-08-29 ENCOUNTER — Encounter: Payer: Self-pay | Admitting: Pharmacist

## 2022-08-29 VITALS — BP 178/80 | HR 100 | Temp 98.2°F | Ht 69.0 in | Wt 152.0 lb

## 2022-08-29 DIAGNOSIS — F419 Anxiety disorder, unspecified: Secondary | ICD-10-CM

## 2022-08-29 DIAGNOSIS — I10 Essential (primary) hypertension: Secondary | ICD-10-CM

## 2022-08-29 DIAGNOSIS — F5101 Primary insomnia: Secondary | ICD-10-CM

## 2022-08-29 DIAGNOSIS — K219 Gastro-esophageal reflux disease without esophagitis: Secondary | ICD-10-CM

## 2022-08-29 DIAGNOSIS — F32A Depression, unspecified: Secondary | ICD-10-CM

## 2022-08-29 MED ORDER — CITALOPRAM HYDROBROMIDE 20 MG PO TABS: ORAL_TABLET | ORAL | 3 refills | Status: DC

## 2022-08-29 MED ORDER — AMLODIPINE BESYLATE 10 MG PO TABS: 10.0000 mg | ORAL_TABLET | Freq: Every day | ORAL | 3 refills | Status: DC

## 2022-08-29 NOTE — Patient Instructions (Signed)
Please take all new medication as prescribed - restarting the amlodipine and celexa  Please continue all other medications as before, and refills have been done if requested.  Please have the pharmacy call with any other refills you may need.  Please keep your appointments with your specialists as you may have planned  Please make an Appointment to return in 3 months, or sooner if needed

## 2022-08-29 NOTE — Telephone Encounter (Signed)
Thanks Amanda

## 2022-08-29 NOTE — Progress Notes (Signed)
Patient ID: Mason Morales, male   DOB: Jan 30, 1956, 67 y.o.   MRN: [id number]        Chief Complaint: follow up HTN, depression, insomnia, gerd       HPI:  Mason Morales is a 67 y.o. male here with c/o mild worsening depression anxiety with non compliance with meds, now willing to restart meds as has done well in past.  This time worse as also with insomnia with difficutly getting to sleep and sometimes staying asleep.  Pt denies chest pain, increased sob or doe, wheezing, orthopnea, PND, increased LE swelling, palpitations, dizziness or syncope.   Pt denies polydipsia, polyuria, or new focal neuro s/s.    Pt denies fever, wt loss, night sweats, loss of appetite, or other constitutional symptoms  Denies worsening reflux, abd pain, dysphagia, n/v, bowel change or blood.       Wt Readings from Last 3 Encounters:  08/29/22 152 lb (68.9 kg)  06/06/22 155 lb (70.3 kg)  01/09/22 159 lb (72.1 kg)   BP Readings from Last 3 Encounters:  08/29/22 (!) 178/80  06/06/22 136/78  01/09/22 (!) 164/97         Past Medical History:  Diagnosis Date   Allergic rhinitis, cause unspecified 10/26/2010   Allergy    DDD (degenerative disc disease), cervical 10/26/2010   DDD (degenerative disc disease), lumbar 10/26/2010   Depression    Generalized headaches    GERD (gastroesophageal reflux disease)    HIV disease (HCC) 06/06/2021   HTN (hypertension) 10/26/2010   Hypertension    Kidney stones    Panic attack    Vaccine counseling 06/06/2021   Past Surgical History:  Procedure Laterality Date   MOUTH SURGERY  at 67 yo after horse accident    reports that he quit smoking about 8 years ago. His smoking use included cigarettes. He has never used smokeless tobacco. He reports current alcohol use of about 6.0 standard drinks of alcohol per week. He reports current drug use. Drug: Marijuana. family history includes Alcohol abuse in an other family member; Cancer in some other family members; Diabetes in some  other family members; Heart disease in an other family member; Hyperlipidemia in an other family member; Hypertension in an other family member; Stroke in some other family members. Allergies  Allergen Reactions   Gadolinium Derivatives Hives   Sulfur Dioxide Nausea And Vomiting   Isovue [Iopamidol] Hives and Itching   Sulfa Antibiotics Nausea And Vomiting   Current Outpatient Medications on File Prior to Visit  Medication Sig Dispense Refill   acetaminophen (TYLENOL) 325 MG tablet Take 2 tablets (650 mg total) by mouth every 6 (six) hours as needed. Do not take more than 4000mg  of tylenol per day 30 tablet 0   aspirin 81 MG EC tablet Take 1 tablet (81 mg total) by mouth daily. Swallow whole. 30 tablet 12   atorvastatin (LIPITOR) 40 MG tablet Take 1 tablet (40 mg total) by mouth daily. 30 tablet 11   cabotegravir & rilpivirine ER (CABENUVA) 600 & 900 MG/3ML injection Inject 1 kit into the muscle every 2 (two) months. 6 mL 5   cabotegravir & rilpivirine ER (CABENUVA) 600 & 900 MG/3ML injection Inject 1 kit into the muscle every 2 (two) months. 6 mL 1   dicyclomine (BENTYL) 20 MG tablet Take 1 tablet (20 mg total) by mouth 2 (two) times daily. 20 tablet 3   famotidine (PEPCID) 20 MG tablet Take 1 tablet (20 mg total) by mouth  2 (two) times daily. 30 tablet 0   Lactulose 20 GM/30ML SOLN 30 ml by mouth up to three times daily 450 mL 1   meloxicam (MOBIC) 15 MG tablet Take 1 tablet (15 mg total) by mouth daily as needed for pain. 90 tablet 3   mupirocin cream (BACTROBAN) 2 % Apply 1 application topically 2 (two) times daily. 15 g 0   omeprazole (PRILOSEC) 20 MG capsule Take 1 capsule (20 mg total) by mouth daily as needed (for heartburn). 30 capsule 4   No current facility-administered medications on file prior to visit.        ROS:  All others reviewed and negative.  Objective        PE:  BP (!) 178/80   Pulse 100   Temp 98.2 F (36.8 C) (Oral)   Ht 5\' 9"  (1.753 m)   Wt 152 lb (68.9 kg)    SpO2 99%   BMI 22.45 kg/m                 Constitutional: Pt appears in NAD               HENT: Head: NCAT.                Right Ear: External ear normal.                 Left Ear: External ear normal.                Eyes: . Pupils are equal, round, and reactive to light. Conjunctivae and EOM are normal               Nose: without d/c or deformity               Neck: Neck supple. Gross normal ROM               Cardiovascular: Normal rate and regular rhythm.                 Pulmonary/Chest: Effort normal and breath sounds without rales or wheezing.                Abd:  Soft, NT, ND, + BS, no organomegaly               Neurological: Pt is alert. At baseline orientation, motor grossly intact               Skin: Skin is warm. No rashes, no other new lesions, LE edema - none               Psychiatric: Pt behavior is normal without agitation   Micro: none  Cardiac tracings I have personally interpreted today:  none  Pertinent Radiological findings (summarize): none   Lab Results  Component Value Date   WBC 4.8 06/06/2022   HGB 13.8 06/06/2022   HCT 40.4 06/06/2022   PLT 228 06/06/2022   GLUCOSE 99 06/06/2022   CHOL 200 (H) 05/09/2021   TRIG 120 05/09/2021   HDL 60 05/09/2021   LDLDIRECT 89.0 12/30/2015   LDLCALC 117 (H) 05/09/2021   ALT 7 (L) 06/06/2022   AST 16 06/06/2022   NA 136 06/06/2022   K 4.0 06/06/2022   CL 102 06/06/2022   CREATININE 1.32 06/06/2022   BUN 15 06/06/2022   CO2 26 06/06/2022   TSH 0.88 04/02/2018   PSA 0.50 04/02/2018   INR 1.0 04/20/2021   Assessment/Plan:  Mason Morales is a  67 y.o. Black or African American [2] male with  has a past medical history of Allergic rhinitis, cause unspecified (10/26/2010), Allergy, DDD (degenerative disc disease), cervical (10/26/2010), DDD (degenerative disc disease), lumbar (10/26/2010), Depression, Generalized headaches, GERD (gastroesophageal reflux disease), HIV disease (HCC) (06/06/2021), HTN (hypertension)  (10/26/2010), Hypertension, Kidney stones, Panic attack, and Vaccine counseling (06/06/2021).  Insomnia Mild worsening, declines rx med - for otc melatonin, and/or benadryl 50 qhs prn  HTN (hypertension) BP Readings from Last 3 Encounters:  08/29/22 (!) 178/80  06/06/22 136/78  01/09/22 (!) 164/97   Uncontrolled, pt to restart medical treatment norvasc 10 qd,    GERD (gastroesophageal reflux disease) Stable overall, to cont prilosec 20 qd  Anxiety and depression Mild to mod recent worsening -for retart celexa 20 qd  Followup: Return in about 3 months (around 11/29/2022).  Oliver Barre, MD 09/01/2022 7:55 PM [facility]

## 2022-08-30 ENCOUNTER — Telehealth: Payer: Self-pay

## 2022-08-30 NOTE — Telephone Encounter (Signed)
[facility] Patient Advocate Encounter  Patient's medication Mason Morales) have been couriered to [facility] from [employer] and will be administered on the patient next office.  Clearance [name] , CPhT Specialty Pharmacy Patient [facility] for Infectious Disease Phone: [phone] Fax:  [phone]

## 2022-09-01 ENCOUNTER — Encounter: Payer: Self-pay | Admitting: Internal Medicine

## 2022-09-01 DIAGNOSIS — G4700 Insomnia, unspecified: Secondary | ICD-10-CM | POA: Insufficient documentation

## 2022-09-01 NOTE — Assessment & Plan Note (Signed)
Mild to mod recent worsening -for retart celexa 20 qd

## 2022-09-01 NOTE — Assessment & Plan Note (Addendum)
BP Readings from Last 3 Encounters:  08/29/22 (!) 178/80  06/06/22 136/78  01/09/22 (!) 164/97   Uncontrolled, pt to restart medical treatment norvasc 10 qd,

## 2022-09-01 NOTE — Assessment & Plan Note (Signed)
Stable overall, to cont prilosec 20 qd

## 2022-09-01 NOTE — Assessment & Plan Note (Signed)
Mild worsening, declines rx med - for otc melatonin, and/or benadryl 50 qhs prn

## 2022-09-04 NOTE — Progress Notes (Unsigned)
HPI: Mason Morales is a 67 y.o. male who presents to the [facility] pharmacy clinic for [place] administration.  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   Chronic constipation 07/22/2019   Left lumbar radiculopathy 03/26/2017   Acute sinus infection 12/14/2016   Pelvic pain 08/24/2016   Left cervical radiculopathy 05/20/2015   Anxiety and depression 05/20/2015   GERD (gastroesophageal reflux disease) 10/26/2010   Allergic rhinitis 10/26/2010   HTN (hypertension) 10/26/2010   Kidney stones 10/26/2010   DDD (degenerative disc disease), cervical 10/26/2010   DDD (degenerative disc disease), lumbar 10/26/2010   Left shoulder pain 10/26/2010   Preventative health care 10/26/2010    Patient's Medications  New Prescriptions   No medications on file  Previous Medications   ACETAMINOPHEN (TYLENOL) 325 MG TABLET    Take 2 tablets (650 mg total) by mouth every 6 (six) hours as needed. Do not take more than 4000mg  of tylenol per day   AMLODIPINE (NORVASC) 10 MG TABLET    Take 1 tablet (10 mg total) by mouth daily.   ASPIRIN 81 MG EC TABLET    Take 1 tablet (81 mg total) by mouth daily. Swallow whole.   ATORVASTATIN (LIPITOR) 40 MG TABLET    Take 1 tablet (40 mg total) by mouth daily.   CABOTEGRAVIR & RILPIVIRINE ER (CABENUVA) 600 & 900 MG/3ML INJECTION    Inject 1 kit into the muscle every 2 (two) months.   CABOTEGRAVIR & RILPIVIRINE ER (CABENUVA) 600 & 900 MG/3ML INJECTION    Inject 1 kit into the muscle every 2 (two) months.   CITALOPRAM (CELEXA) 20 MG TABLET    TAKE 1 TABLET(20 MG) BY MOUTH DAILY   DICYCLOMINE (BENTYL) 20 MG TABLET    Take 1 tablet (20 mg total) by mouth 2 (two) times daily.   FAMOTIDINE (PEPCID) 20 MG TABLET    Take 1 tablet (20 mg total) by mouth 2 (two) times daily.   LACTULOSE 20 GM/30ML  SOLN    30 ml by mouth up to three times daily   MELOXICAM (MOBIC) 15 MG TABLET    Take 1 tablet (15 mg total) by mouth daily as needed for pain.   MUPIROCIN CREAM (BACTROBAN) 2 %    Apply 1 application topically 2 (two) times daily.   OMEPRAZOLE (PRILOSEC) 20 MG CAPSULE    Take 1 capsule (20 mg total) by mouth daily as needed (for heartburn).  Modified Medications   No medications on file  Discontinued Medications   No medications on file    Allergies: Allergies  Allergen Reactions   Gadolinium Derivatives Hives   Sulfur Dioxide Nausea And Vomiting   Isovue [Iopamidol] Hives and Itching   Sulfa Antibiotics Nausea And Vomiting    Past Medical History: Past Medical History:  Diagnosis Date   Allergic rhinitis, cause unspecified 10/26/2010   Allergy    DDD (degenerative disc disease), cervical 10/26/2010   DDD (degenerative disc disease), lumbar 10/26/2010   Depression    Generalized headaches    GERD (gastroesophageal reflux disease)    HIV disease (HCC) 06/06/2021   HTN (hypertension) 10/26/2010   Hypertension    Kidney stones    Panic attack    Vaccine counseling 06/06/2021    Social History: Social History   Socioeconomic History   Marital status: Single  Spouse name: Not on file   Number of children: Not on file   Years of education: 14   Highest education level: Not on file  Occupational History   Occupation: Raw Materials Analyst    Employer: [employer]  Tobacco Use   Smoking status: Former    Types: Cigarettes    Quit date: 03/20/2014    Years since quitting: 8.4   Smokeless tobacco: Never  Vaping Use   Vaping Use: Never used  Substance and Sexual Activity   Alcohol use: Yes    Alcohol/week: 6.0 standard drinks of alcohol    Types: 6 Cans of beer per week    Comment: occasionally   Drug use: Yes    Types: Marijuana    Comment: THC-daily   Sexual activity: Yes    Comment: declined condoms  Other Topics Concern   Not on file  Social History Narrative    Not on file   Social Determinants of Health   Financial Resource Strain: Not on file  Food Insecurity: Not on file  Transportation Needs: Not on file  Physical Activity: Not on file  Stress: Not on file  Social Connections: Not on file    Labs: Lab Results  Component Value Date   HIV1RNAQUANT Not Detected 06/06/2022   HIV1RNAQUANT Not Detected 03/06/2022   HIV1RNAQUANT Not Detected 01/09/2022    RPR and STI Lab Results  Component Value Date   LABRPR REACTIVE (A) 06/06/2022   LABRPR REACTIVE (A) 01/09/2022   LABRPR REACTIVE (A) 09/28/2021   LABRPR REACTIVE (A) 06/07/2021   LABRPR REACTIVE (A) 05/09/2021   RPRTITER 1:1 (H) 06/06/2022   RPRTITER 1:1 (H) 01/09/2022   RPRTITER 1:2 (H) 09/28/2021   RPRTITER 1:2 (H) 06/07/2021   RPRTITER 1:2 (H) 05/09/2021    STI Results GC GC CT CT  06/06/2022  4:10 PM Negative    Negative   Negative    Negative    01/09/2022 10:17 AM Negative    Negative   Negative    Negative    09/28/2021 11:18 AM Negative   Negative    05/09/2021 12:25 PM Negative   Negative    02/13/2021  3:57 PM Negative    Negative   Negative    Negative    12/28/2020  2:31 PM Negative    Negative    Negative   Negative    Negative    Negative    11/08/2016  8:15 AM  NOT DETECTED   NOT DETECTED     Hepatitis B Lab Results  Component Value Date   HEPBSAB REACTIVE (A) 05/09/2021   HEPBSAG NON-REACTIVE 06/06/2022   HEPBCAB REACTIVE (A) 05/09/2021   Hepatitis C Lab Results  Component Value Date   HEPCAB NON-REACTIVE 06/06/2022   Hepatitis A Lab Results  Component Value Date   HAV NON-REACTIVE 05/09/2021   Lipids: Lab Results  Component Value Date   CHOL 200 (H) 05/09/2021   TRIG 120 05/09/2021   HDL 60 05/09/2021   CHOLHDL 3.3 05/09/2021   VLDL 16.1 04/02/2018   LDLCALC 117 (H) 05/09/2021    Current HIV Regimen: Symtuza  TARGET DATE: the 15th of the month  Assessment: Mason Morales presents today for reinitiation injection for  Cabenuva. He was previously on Apretude for PrEP until he missed an injection and was subsequently diagnosed with HIV in 04/2021. He was previously on Guinea for management of HIV. Last injection was 03/06/2022. He missed his follow up injection appointment and ha been taking Symtuza in place  of Cabenuva.  Counseled that Guinea is two separate intramuscular injections in the gluteal muscle on each side for each visit. Explained that the second injection is 30 days after the initial injection then every 2 months thereafter. Discussed the rare but significant chance of developing resistance despite compliance. Explained that showing up to injection appointments is very important and warned that if 2 appointments are missed, it will be reassessed by their provider whether they are a good candidate for injection therapy. Counseled on possible side effects associated with the injections such as injection site pain, which is usually mild to moderate in nature, injection site nodules, and injection site reactions. Asked to call the clinic or send me a mychart message if they experience any issues, such as fatigue, nausea, headache, rash, or dizziness. Advised that they can take ibuprofen or tylenol for injection site pain if needed.   Administered cabotegravir 600mg /37mL in left upper outer quadrant of the gluteal muscle. Administered rilpivirine 900 mg/60mL in the right upper outer quadrant of the gluteal muscle. Monitored patient for 10 minutes after injection. Injections were tolerated well without issue. Counseled to stop taking *** after today's dose and to call with any issues that may arise. Will make follow up appointments for second initiation injection in 30 days and then maintenance injections every 2 months thereafter.   Plan: - Stop Symtuza after today's dose - First Cabenuva injections administered - Second initiation injection scheduled for *** - Maintenance injections scheduled for *** - Call  with any issues or questions  Irish Elders, PharmD PGY-1 [facility] Pharm[facility] Resident

## 2022-09-05 ENCOUNTER — Other Ambulatory Visit: Payer: Self-pay

## 2022-09-05 ENCOUNTER — Ambulatory Visit (INDEPENDENT_AMBULATORY_CARE_PROVIDER_SITE_OTHER): Payer: Self-pay | Admitting: Pharmacist

## 2022-09-05 DIAGNOSIS — B20 Human immunodeficiency virus [HIV] disease: Secondary | ICD-10-CM

## 2022-09-05 DIAGNOSIS — Z113 Encounter for screening for infections with a predominantly sexual mode of transmission: Secondary | ICD-10-CM

## 2022-09-05 MED ORDER — CABOTEGRAVIR & RILPIVIRINE ER 600 & 900 MG/3ML IM SUER
1.0000 | Freq: Once | INTRAMUSCULAR | Status: AC
Start: 2022-09-05 — End: 2022-09-05
  Administered 2022-09-05: 1 via INTRAMUSCULAR

## 2022-09-05 NOTE — Progress Notes (Signed)
HPI: Mason Morales is a 67 y.o. male who presents to the [facility] pharmacy clinic for [place] administration.  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   Chronic constipation 07/22/2019   Left lumbar radiculopathy 03/26/2017   Acute sinus infection 12/14/2016   Pelvic pain 08/24/2016   Left cervical radiculopathy 05/20/2015   Anxiety and depression 05/20/2015   GERD (gastroesophageal reflux disease) 10/26/2010   Allergic rhinitis 10/26/2010   HTN (hypertension) 10/26/2010   Kidney stones 10/26/2010   DDD (degenerative disc disease), cervical 10/26/2010   DDD (degenerative disc disease), lumbar 10/26/2010   Left shoulder pain 10/26/2010   Preventative health care 10/26/2010    Patient's Medications  New Prescriptions   No medications on file  Previous Medications   ACETAMINOPHEN (TYLENOL) 325 MG TABLET    Take 2 tablets (650 mg total) by mouth every 6 (six) hours as needed. Do not take more than 4000mg  of tylenol per day   AMLODIPINE (NORVASC) 10 MG TABLET    Take 1 tablet (10 mg total) by mouth daily.   ASPIRIN 81 MG EC TABLET    Take 1 tablet (81 mg total) by mouth daily. Swallow whole.   ATORVASTATIN (LIPITOR) 40 MG TABLET    Take 1 tablet (40 mg total) by mouth daily.   CABOTEGRAVIR & RILPIVIRINE ER (CABENUVA) 600 & 900 MG/3ML INJECTION    Inject 1 kit into the muscle every 2 (two) months.   CABOTEGRAVIR & RILPIVIRINE ER (CABENUVA) 600 & 900 MG/3ML INJECTION    Inject 1 kit into the muscle every 2 (two) months.   CITALOPRAM (CELEXA) 20 MG TABLET    TAKE 1 TABLET(20 MG) BY MOUTH DAILY   DICYCLOMINE (BENTYL) 20 MG TABLET    Take 1 tablet (20 mg total) by mouth 2 (two) times daily.   FAMOTIDINE (PEPCID) 20 MG TABLET    Take 1 tablet (20 mg total) by mouth 2 (two) times daily.   LACTULOSE 20 GM/30ML  SOLN    30 ml by mouth up to three times daily   MELOXICAM (MOBIC) 15 MG TABLET    Take 1 tablet (15 mg total) by mouth daily as needed for pain.   MUPIROCIN CREAM (BACTROBAN) 2 %    Apply 1 application topically 2 (two) times daily.   OMEPRAZOLE (PRILOSEC) 20 MG CAPSULE    Take 1 capsule (20 mg total) by mouth daily as needed (for heartburn).  Modified Medications   No medications on file  Discontinued Medications   No medications on file    Allergies: Allergies  Allergen Reactions   Gadolinium Derivatives Hives   Sulfur Dioxide Nausea And Vomiting   Isovue [Iopamidol] Hives and Itching   Sulfa Antibiotics Nausea And Vomiting    Past Medical History: Past Medical History:  Diagnosis Date   Allergic rhinitis, cause unspecified 10/26/2010   Allergy    DDD (degenerative disc disease), cervical 10/26/2010   DDD (degenerative disc disease), lumbar 10/26/2010   Depression    Generalized headaches    GERD (gastroesophageal reflux disease)    HIV disease (HCC) 06/06/2021   HTN (hypertension) 10/26/2010   Hypertension    Kidney stones    Panic attack    Vaccine counseling 06/06/2021    Social History: Social History   Socioeconomic History   Marital status: Single  Spouse name: Not on file   Number of children: Not on file   Years of education: 14   Highest education level: Not on file  Occupational History   Occupation: Raw Materials Analyst    Employer: [employer]  Tobacco Use   Smoking status: Former    Types: Cigarettes    Quit date: 03/20/2014    Years since quitting: 8.4   Smokeless tobacco: Never  Vaping Use   Vaping Use: Never used  Substance and Sexual Activity   Alcohol use: Yes    Alcohol/week: 6.0 standard drinks of alcohol    Types: 6 Cans of beer per week    Comment: occasionally   Drug use: Yes    Types: Marijuana    Comment: THC-daily   Sexual activity: Yes    Comment: declined condoms  Other Topics Concern   Not on file  Social History Narrative    Not on file   Social Determinants of Health   Financial Resource Strain: Not on file  Food Insecurity: Not on file  Transportation Needs: Not on file  Physical Activity: Not on file  Stress: Not on file  Social Connections: Not on file    Labs: Lab Results  Component Value Date   HIV1RNAQUANT Not Detected 06/06/2022   HIV1RNAQUANT Not Detected 03/06/2022   HIV1RNAQUANT Not Detected 01/09/2022    RPR and STI Lab Results  Component Value Date   LABRPR REACTIVE (A) 06/06/2022   LABRPR REACTIVE (A) 01/09/2022   LABRPR REACTIVE (A) 09/28/2021   LABRPR REACTIVE (A) 06/07/2021   LABRPR REACTIVE (A) 05/09/2021   RPRTITER 1:1 (H) 06/06/2022   RPRTITER 1:1 (H) 01/09/2022   RPRTITER 1:2 (H) 09/28/2021   RPRTITER 1:2 (H) 06/07/2021   RPRTITER 1:2 (H) 05/09/2021    STI Results GC GC CT CT  06/06/2022  4:10 PM Negative    Negative   Negative    Negative    01/09/2022 10:17 AM Negative    Negative   Negative    Negative    09/28/2021 11:18 AM Negative   Negative    05/09/2021 12:25 PM Negative   Negative    02/13/2021  3:57 PM Negative    Negative   Negative    Negative    12/28/2020  2:31 PM Negative    Negative    Negative   Negative    Negative    Negative    11/08/2016  8:15 AM  NOT DETECTED   NOT DETECTED     Hepatitis B Lab Results  Component Value Date   HEPBSAB REACTIVE (A) 05/09/2021   HEPBSAG NON-REACTIVE 06/06/2022   HEPBCAB REACTIVE (A) 05/09/2021   Hepatitis C Lab Results  Component Value Date   HEPCAB NON-REACTIVE 06/06/2022   Hepatitis A Lab Results  Component Value Date   HAV NON-REACTIVE 05/09/2021   Lipids: Lab Results  Component Value Date   CHOL 200 (H) 05/09/2021   TRIG 120 05/09/2021   HDL 60 05/09/2021   CHOLHDL 3.3 05/09/2021   VLDL 16.1 04/02/2018   LDLCALC 117 (H) 05/09/2021    Current HIV Regimen: Symtuza  TARGET DATE: the 15th of the month  Assessment: Badr presents today for reinitiation injection for  Cabenuva. He was previously on Apretude for PrEP until he missed an injection and was subsequently diagnosed with HIV in 04/2021. He was previously on Guinea for management of HIV. Last injection was 03/06/2022. He missed his follow up injection appointment and has been taking Symtuza in place  of Cabenuva. Tore is adamant on not missing any appointments and wants to continue forward with the injections. He previously got a 60 days supply of Symtuza and has not missed any doses.  Counseled that Guinea is two separate intramuscular injections in the gluteal muscle on each side for each visit. Explained that the second injection is 30 days after the initial injection then every 2 months thereafter. Discussed the rare but significant chance of developing resistance despite compliance. Explained that showing up to injection appointments is very important and warned that if he misses another injection appointment, he will have to go back to taking oral medications. Counseled on possible side effects associated with the injections such as injection site pain, which is usually mild to moderate in nature, injection site nodules, and injection site reactions. Asked to call the clinic or send me a mychart message if they experience any issues, such as fatigue, nausea, headache, rash, or dizziness. Advised that they can take ibuprofen or tylenol for injection site pain if needed.   Administered cabotegravir 600mg /72mL in left upper outer quadrant of the gluteal muscle. Administered rilpivirine 900 mg/40mL in the right upper outer quadrant of the gluteal muscle. Monitored patient for 10 minutes after injection. Injections were tolerated well without issue and administered one at a time. HIV RNA assay ordered for viral load monitoring. Counseled to stop taking Symtuza after today's dose and to call with any issues that may arise. Will make follow up appointments for second initiation injection in 30 days and then  maintenance injections every 2 months thereafter.   Plan: - Stop Symtuza after today's dose - First Cabenuva injections administered - HIV RNA assay ordered - Second initiation injection scheduled for 10/03/2022 with Marchelle Folks at 10AM - Maintenance injections scheduled for 12/05/2022 with Cassie at 10AM - Call with any issues or questions  Thanks,  Arabella Merles, PharmD. [facility] Acute Care PGY-1 09/05/2022 11:59 AM

## 2022-09-08 LAB — HIV-1 RNA QUANT-NO REFLEX-BLD
HIV 1 RNA Quant: NOT DETECTED Copies/mL
HIV-1 RNA Quant, Log: NOT DETECTED Log cps/mL

## 2022-09-26 ENCOUNTER — Telehealth: Payer: Self-pay | Admitting: Pharmacist

## 2022-09-26 NOTE — Telephone Encounter (Signed)
Patient's specialty medication Renaldo Harrison) was delivered from [facility] and will be administered at next office visit on 6/12.  Margarite Gouge, PharmD, CPP, BCIDP, AAHIVP Clinical Pharmacist Practitioner Infectious Diseases Clinical Pharmacist [facility]

## 2022-10-03 ENCOUNTER — Other Ambulatory Visit: Payer: Self-pay

## 2022-10-03 ENCOUNTER — Ambulatory Visit (INDEPENDENT_AMBULATORY_CARE_PROVIDER_SITE_OTHER): Payer: Self-pay | Admitting: Pharmacist

## 2022-10-03 DIAGNOSIS — B20 Human immunodeficiency virus [HIV] disease: Secondary | ICD-10-CM

## 2022-10-03 MED ORDER — CABOTEGRAVIR & RILPIVIRINE ER 600 & 900 MG/3ML IM SUER
1.0000 | Freq: Once | INTRAMUSCULAR | Status: AC
Start: 2022-10-03 — End: 2022-10-03
  Administered 2022-10-03: 1 via INTRAMUSCULAR

## 2022-10-03 NOTE — Progress Notes (Signed)
HPI: Mason Morales is a 67 y.o. male who presents to the [facility] pharmacy clinic for Mason Morales.  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   Chronic constipation 07/22/2019   Left lumbar radiculopathy 03/26/2017   Acute sinus infection 12/14/2016   Pelvic pain 08/24/2016   Left cervical radiculopathy 05/20/2015   Anxiety and depression 05/20/2015   GERD (gastroesophageal reflux disease) 10/26/2010   Allergic rhinitis 10/26/2010   HTN (hypertension) 10/26/2010   Kidney stones 10/26/2010   DDD (degenerative disc disease), cervical 10/26/2010   DDD (degenerative disc disease), lumbar 10/26/2010   Left shoulder pain 10/26/2010   Preventative health care 10/26/2010    Patient's Medications  New Prescriptions   No medications on file  Previous Medications   ACETAMINOPHEN (TYLENOL) 325 MG TABLET    Take 2 tablets (650 mg total) by mouth every 6 (six) hours as needed. Do not take more than 4000mg  of tylenol per day   AMLODIPINE (NORVASC) 10 MG TABLET    Take 1 tablet (10 mg total) by mouth daily.   ASPIRIN 81 MG EC TABLET    Take 1 tablet (81 mg total) by mouth daily. Swallow whole.   ATORVASTATIN (LIPITOR) 40 MG TABLET    Take 1 tablet (40 mg total) by mouth daily.   CABOTEGRAVIR & RILPIVIRINE ER (CABENUVA) 600 & 900 MG/3ML INJECTION    Inject 1 kit into the muscle every 2 (two) months.   CABOTEGRAVIR & RILPIVIRINE ER (CABENUVA) 600 & 900 MG/3ML INJECTION    Inject 1 kit into the muscle every 2 (two) months.   CITALOPRAM (CELEXA) 20 MG TABLET    TAKE 1 TABLET(20 MG) BY MOUTH DAILY   DICYCLOMINE (BENTYL) 20 MG TABLET    Take 1 tablet (20 mg total) by mouth 2 (two) times daily.   FAMOTIDINE (PEPCID) 20 MG TABLET    Take 1 tablet (20 mg total) by mouth 2 (two) times daily.   LACTULOSE 20 GM/30ML  SOLN    30 ml by mouth up to three times daily   MELOXICAM (MOBIC) 15 MG TABLET    Take 1 tablet (15 mg total) by mouth daily as needed for pain.   MUPIROCIN CREAM (BACTROBAN) 2 %    Apply 1 application topically 2 (two) times daily.   OMEPRAZOLE (PRILOSEC) 20 MG CAPSULE    Take 1 capsule (20 mg total) by mouth daily as needed (for heartburn).  Modified Medications   No medications on file  Discontinued Medications   No medications on file    Allergies: Allergies  Allergen Reactions   Gadolinium Derivatives Hives   Sulfur Dioxide Nausea And Vomiting   Isovue [Iopamidol] Hives and Itching   Sulfa Antibiotics Nausea And Vomiting    Past Medical History: Past Medical History:  Diagnosis Date   Allergic rhinitis, cause unspecified 10/26/2010   Allergy    DDD (degenerative disc disease), cervical 10/26/2010   DDD (degenerative disc disease), lumbar 10/26/2010   Depression    Generalized headaches    GERD (gastroesophageal reflux disease)    HIV disease (HCC) 06/06/2021   HTN (hypertension) 10/26/2010   Hypertension    Kidney stones    Panic attack    Vaccine counseling 06/06/2021    Social History: Social History   Socioeconomic History   Marital status: Single  Spouse name: Not on file   Number of children: Not on file   Years of education: 14   Highest education level: Not on file  Occupational History   Occupation: Raw Materials Analyst    Employer: [employer]  Tobacco Use   Smoking status: Former    Types: Cigarettes    Quit date: 03/20/2014    Years since quitting: 8.5   Smokeless tobacco: Never  Vaping Use   Vaping Use: Never used  Substance and Sexual Activity   Alcohol use: Yes    Alcohol/week: 6.0 standard drinks of alcohol    Types: 6 Cans of beer per week    Comment: occasionally   Drug use: Yes    Types: Marijuana    Comment: THC-daily   Sexual activity: Yes    Comment: declined condoms  Other Topics Concern   Not on file  Social History Narrative    Not on file   Social Determinants of Health   Financial Resource Strain: Not on file  Food Insecurity: Not on file  Transportation Needs: Not on file  Physical Activity: Not on file  Stress: Not on file  Social Connections: Not on file    Labs: Lab Results  Component Value Date   HIV1RNAQUANT Not Detected 09/05/2022   HIV1RNAQUANT Not Detected 06/06/2022   HIV1RNAQUANT Not Detected 03/06/2022    RPR and STI Lab Results  Component Value Date   LABRPR REACTIVE (A) 06/06/2022   LABRPR REACTIVE (A) 01/09/2022   LABRPR REACTIVE (A) 09/28/2021   LABRPR REACTIVE (A) 06/07/2021   LABRPR REACTIVE (A) 05/09/2021   RPRTITER 1:1 (H) 06/06/2022   RPRTITER 1:1 (H) 01/09/2022   RPRTITER 1:2 (H) 09/28/2021   RPRTITER 1:2 (H) 06/07/2021   RPRTITER 1:2 (H) 05/09/2021    STI Results GC GC CT CT  06/06/2022  4:10 PM Negative    Negative   Negative    Negative    01/09/2022 10:17 AM Negative    Negative   Negative    Negative    09/28/2021 11:18 AM Negative   Negative    05/09/2021 12:25 PM Negative   Negative    02/13/2021  3:57 PM Negative    Negative   Negative    Negative    12/28/2020  2:31 PM Negative    Negative    Negative   Negative    Negative    Negative    11/08/2016  8:15 AM  NOT DETECTED   NOT DETECTED     Hepatitis B Lab Results  Component Value Date   HEPBSAB REACTIVE (A) 05/09/2021   HEPBSAG NON-REACTIVE 06/06/2022   HEPBCAB REACTIVE (A) 05/09/2021   Hepatitis C Lab Results  Component Value Date   HEPCAB NON-REACTIVE 06/06/2022   Hepatitis A Lab Results  Component Value Date   HAV NON-REACTIVE 05/09/2021   Lipids: Lab Results  Component Value Date   CHOL 200 (H) 05/09/2021   TRIG 120 05/09/2021   HDL 60 05/09/2021   CHOLHDL 3.3 05/09/2021   VLDL 16.1 04/02/2018   LDLCALC 117 (H) 05/09/2021    TARGET DATE:  The 15th of the month  Current HIV Regimen: Cabenuva  Assessment: Mason Morales presents today for their maintenance Cabenuva  injections. Initial/past injections were tolerated well without issues. No problems with systemic effects of injections.  Administered cabotegravir 600mg /44mL in left upper outer quadrant of the gluteal muscle. Administered rilpivirine 900 mg/78mL in the right upper outer quadrant of the gluteal muscle. Monitored patient for 10 minutes  after injection. Injections were tolerated well without issue. Patient will follow up in 2 months for next injection. Will check HIV RNA today. Patient denies any recent sexual activity and politely declines STI testing. Up-to-date on vaccines other than the Shingrix series; he states he wants to look into these more.    Plan: - Cabenuva injections administered - Check HIV RNA  - Next injections scheduled for 8/14 with Cassie  - Call with any issues or questions  Margarite Gouge, PharmD, CPP, BCIDP, AAHIVP Clinical Pharmacist Practitioner Infectious Diseases Clinical Pharmacist [facility]

## 2022-10-05 LAB — HIV-1 RNA QUANT-NO REFLEX-BLD
HIV 1 RNA Quant: NOT DETECTED Copies/mL
HIV-1 RNA Quant, Log: NOT DETECTED Log cps/mL

## 2022-11-21 ENCOUNTER — Other Ambulatory Visit: Payer: Self-pay | Admitting: Pharmacist

## 2022-11-21 DIAGNOSIS — B20 Human immunodeficiency virus [HIV] disease: Secondary | ICD-10-CM

## 2022-11-21 MED ORDER — CABOTEGRAVIR & RILPIVIRINE ER 600 & 900 MG/3ML IM SUER
1.0000 | INTRAMUSCULAR | 5 refills | Status: DC
Start: 2022-11-21 — End: 2023-11-21

## 2022-11-28 ENCOUNTER — Telehealth: Payer: Self-pay

## 2022-11-28 NOTE — Telephone Encounter (Signed)
[facility] Patient Advocate Encounter  Patient's medication Renaldo Harrison) have been couriered to [facility] from [employer] and will be administered on the patient next office visit on 12/05/22.  Clearance [name] , CPhT Specialty Pharmacy Patient [facility] Phone: [phone] Fax:  [phone]

## 2022-12-05 ENCOUNTER — Ambulatory Visit (INDEPENDENT_AMBULATORY_CARE_PROVIDER_SITE_OTHER): Payer: Self-pay | Admitting: Pharmacist

## 2022-12-05 ENCOUNTER — Other Ambulatory Visit: Payer: Self-pay

## 2022-12-05 ENCOUNTER — Other Ambulatory Visit (HOSPITAL_COMMUNITY): Payer: Self-pay

## 2022-12-05 DIAGNOSIS — B20 Human immunodeficiency virus [HIV] disease: Secondary | ICD-10-CM

## 2022-12-05 DIAGNOSIS — Z113 Encounter for screening for infections with a predominantly sexual mode of transmission: Secondary | ICD-10-CM

## 2022-12-05 MED ORDER — CABOTEGRAVIR & RILPIVIRINE ER 600 & 900 MG/3ML IM SUER
1.0000 | Freq: Once | INTRAMUSCULAR | Status: AC
Start: 2022-12-05 — End: 2022-12-05
  Administered 2022-12-05: 1 via INTRAMUSCULAR

## 2022-12-05 NOTE — Progress Notes (Signed)
HPI: Mason Morales is a 67 y.o. male who presents to the [facility] pharmacy clinic for [place] administration.  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   Chronic constipation 07/22/2019   Left lumbar radiculopathy 03/26/2017   Acute sinus infection 12/14/2016   Pelvic pain 08/24/2016   Left cervical radiculopathy 05/20/2015   Anxiety and depression 05/20/2015   GERD (gastroesophageal reflux disease) 10/26/2010   Allergic rhinitis 10/26/2010   HTN (hypertension) 10/26/2010   Kidney stones 10/26/2010   DDD (degenerative disc disease), cervical 10/26/2010   DDD (degenerative disc disease), lumbar 10/26/2010   Left shoulder pain 10/26/2010   Preventative health care 10/26/2010    Patient's Medications  New Prescriptions   No medications on file  Previous Medications   ACETAMINOPHEN (TYLENOL) 325 MG TABLET    Take 2 tablets (650 mg total) by mouth every 6 (six) hours as needed. Do not take more than 4000mg  of tylenol per day   AMLODIPINE (NORVASC) 10 MG TABLET    Take 1 tablet (10 mg total) by mouth daily.   ASPIRIN 81 MG EC TABLET    Take 1 tablet (81 mg total) by mouth daily. Swallow whole.   ATORVASTATIN (LIPITOR) 40 MG TABLET    Take 1 tablet (40 mg total) by mouth daily.   CABOTEGRAVIR & RILPIVIRINE ER (CABENUVA) 600 & 900 MG/3ML INJECTION    Inject 1 kit into the muscle every 2 (two) months.   CABOTEGRAVIR & RILPIVIRINE ER (CABENUVA) 600 & 900 MG/3ML INJECTION    Inject 1 kit into the muscle every 2 (two) months.   CITALOPRAM (CELEXA) 20 MG TABLET    TAKE 1 TABLET(20 MG) BY MOUTH DAILY   DICYCLOMINE (BENTYL) 20 MG TABLET    Take 1 tablet (20 mg total) by mouth 2 (two) times daily.   FAMOTIDINE (PEPCID) 20 MG TABLET    Take 1 tablet (20 mg total) by mouth 2 (two) times daily.   LACTULOSE 20 GM/30ML  SOLN    30 ml by mouth up to three times daily   MELOXICAM (MOBIC) 15 MG TABLET    Take 1 tablet (15 mg total) by mouth daily as needed for pain.   MUPIROCIN CREAM (BACTROBAN) 2 %    Apply 1 application topically 2 (two) times daily.   OMEPRAZOLE (PRILOSEC) 20 MG CAPSULE    Take 1 capsule (20 mg total) by mouth daily as needed (for heartburn).  Modified Medications   No medications on file  Discontinued Medications   No medications on file    Allergies: Allergies  Allergen Reactions   Gadolinium Derivatives Hives   Sulfur Dioxide Nausea And Vomiting   Isovue [Iopamidol] Hives and Itching   Sulfa Antibiotics Nausea And Vomiting    Labs: Lab Results  Component Value Date   HIV1RNAQUANT Not Detected 10/03/2022   HIV1RNAQUANT Not Detected 09/05/2022   HIV1RNAQUANT Not Detected 06/06/2022    RPR and STI Lab Results  Component Value Date   LABRPR REACTIVE (A) 06/06/2022   LABRPR REACTIVE (A) 01/09/2022   LABRPR REACTIVE (A) 09/28/2021   LABRPR REACTIVE (A) 06/07/2021   LABRPR REACTIVE (A) 05/09/2021   RPRTITER 1:1 (H) 06/06/2022   RPRTITER 1:1 (H) 01/09/2022   RPRTITER 1:2 (H) 09/28/2021   RPRTITER 1:2 (H) 06/07/2021   RPRTITER 1:2 (H) 05/09/2021  STI Results GC GC CT CT  06/06/2022  4:10 PM Negative    Negative   Negative    Negative    01/09/2022 10:17 AM Negative    Negative   Negative    Negative    09/28/2021 11:18 AM Negative   Negative    05/09/2021 12:25 PM Negative   Negative    02/13/2021  3:57 PM Negative    Negative   Negative    Negative    12/28/2020  2:31 PM Negative    Negative    Negative   Negative    Negative    Negative    11/08/2016  8:15 AM  NOT DETECTED   NOT DETECTED     Hepatitis B Lab Results  Component Value Date   HEPBSAB REACTIVE (A) 05/09/2021   HEPBSAG NON-REACTIVE 06/06/2022   HEPBCAB REACTIVE (A) 05/09/2021   Hepatitis C Lab Results  Component Value Date   HEPCAB NON-REACTIVE 06/06/2022   Hepatitis A Lab Results   Component Value Date   HAV NON-REACTIVE 05/09/2021   Lipids: Lab Results  Component Value Date   CHOL 200 (H) 05/09/2021   TRIG 120 05/09/2021   HDL 60 05/09/2021   CHOLHDL 3.3 05/09/2021   VLDL 42.5 04/02/2018   LDLCALC 117 (H) 05/09/2021    TARGET DATE: The 15th of the month  Assessment: Mason Morales presents today for maintenance Cabenuva injections. Past injections were tolerated well without issues. Will check CD4 and HIV RNA viral load today.  Administered cabotegravir 600mg /80mL in left upper outer quadrant of the gluteal muscle. Administered rilpivirine 900 mg/60mL in the right upper outer quadrant of the gluteal muscle. No issues with injections. He will follow up in 2 months for next set of injections.  Mason Morales was informed that he had a possible exposure to syphilis. We we check RPR and other STI testing today and will follow-up if treatment or further testing is needed. He does report new SOB when walking. He is unsure if this is anxiety, however he does have a family history of heart failure. Advised patient to follow-up with his PCP for further work-up.    Plan: - Cabenuva injections administered - Screening for RPR, oral/urine cytologies collected  - Obtained CD4 count and HIV RNA viral load - Next injections scheduled for 10/8 with Dr. Renold Don and 12/12 with Cassie - Call with any issues or questions  Mason Morales, PharmD PGY1 Pharmacy Resident 12/05/2022 9:46 AM

## 2022-12-06 LAB — URINE CYTOLOGY ANCILLARY ONLY
Chlamydia: NEGATIVE
Comment: NEGATIVE
Comment: NORMAL
Neisseria Gonorrhea: NEGATIVE

## 2022-12-06 LAB — T-HELPER CELLS (CD4) COUNT (NOT AT ARMC)
CD4 % Helper T Cell: 37 % (ref 33–65)
CD4 T Cell Abs: 1124 /uL (ref 400–1790)

## 2022-12-06 LAB — CYTOLOGY, (ORAL, ANAL, URETHRAL) ANCILLARY ONLY
Chlamydia: NEGATIVE
Comment: NEGATIVE
Comment: NORMAL
Neisseria Gonorrhea: NEGATIVE

## 2022-12-08 LAB — RPR: RPR Ser Ql: REACTIVE — AB

## 2022-12-08 LAB — RPR TITER: RPR Titer: 1:1 {titer} — ABNORMAL HIGH

## 2022-12-08 LAB — T PALLIDUM AB: T Pallidum Abs: POSITIVE — AB

## 2022-12-08 LAB — HIV-1 RNA QUANT-NO REFLEX-BLD
HIV 1 RNA Quant: NOT DETECTED {copies}/mL
HIV-1 RNA Quant, Log: NOT DETECTED {Log_copies}/mL

## 2023-01-22 ENCOUNTER — Telehealth: Payer: Self-pay

## 2023-01-22 NOTE — Telephone Encounter (Signed)
[facility] Patient Advocate Encounter  Patient's medications (CABENUVA) have been couriered to [facility] from [facility] and will be administered at the patients appointment on  01/29/23.  Kae Heller , CPhT Specialty Pharmacy Patient [facility] for Infectious Disease Phone: [phone] Fax:  [phone]

## 2023-01-29 ENCOUNTER — Ambulatory Visit: Payer: Medicare HMO | Admitting: Internal Medicine

## 2023-01-29 ENCOUNTER — Other Ambulatory Visit: Payer: Self-pay

## 2023-01-29 ENCOUNTER — Encounter: Payer: Self-pay | Admitting: Internal Medicine

## 2023-01-29 VITALS — BP 143/80 | HR 80 | Temp 97.5°F | Wt 152.0 lb

## 2023-01-29 DIAGNOSIS — Z87442 Personal history of urinary calculi: Secondary | ICD-10-CM

## 2023-01-29 DIAGNOSIS — B20 Human immunodeficiency virus [HIV] disease: Secondary | ICD-10-CM

## 2023-01-29 DIAGNOSIS — Z113 Encounter for screening for infections with a predominantly sexual mode of transmission: Secondary | ICD-10-CM

## 2023-01-29 DIAGNOSIS — R109 Unspecified abdominal pain: Secondary | ICD-10-CM | POA: Diagnosis not present

## 2023-01-29 DIAGNOSIS — Z23 Encounter for immunization: Secondary | ICD-10-CM | POA: Diagnosis not present

## 2023-01-29 DIAGNOSIS — R1031 Right lower quadrant pain: Secondary | ICD-10-CM | POA: Diagnosis not present

## 2023-01-29 MED ORDER — CABOTEGRAVIR & RILPIVIRINE ER 600 & 900 MG/3ML IM SUER
1.0000 | Freq: Once | INTRAMUSCULAR | Status: AC
Start: 2023-01-29 — End: 2023-01-29
  Administered 2023-01-29: 1 via INTRAMUSCULAR

## 2023-01-29 NOTE — Progress Notes (Signed)
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 male htn, gerd, kidney stones, previously on appretude study who acquired HIV while taking appretude, here for f/u hiv care  06/2021 [facility] clinic visit He has been in the appretude study with Dr Zenaida [place]/[facility] research group (hptn 084 study). He had transitioned to commercial appretude after the study  He was infected with monkey pox/treated in 12/2020  It appears he failed to get one injection of apretude in 03/2021. He presented to the ER 12/29 with flu like symptoms. He returned to [facility] 04/2021 with viral load 246 although hiv screen was negative. Rapid start initiated with 2nd viral load of 2610 the same month. His initial reflex genotype/integrase testing showed no resistance.  He saw dr Algis Liming 2/23 and had an archive genotype ordered  He has been on symtuza. No missed dose last 4 weeks. Tolerate it well.  Other meds: Celexa Amlodipine Bentyl Mobic Asa Prednisone   He had syphilis infection hx. Rpr has been stable at 1:2. He was given early latent treatment 2 months prior to this visit (04/2021), and years ago  also for early latent (never treated as complicated syphilis or late latent syphilis)   Patient is bi-sexual, but doesn't do receptive anal sex. 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, Human resources officer No substance use; occasional thc (used for anxiety and panic attack) Hobbies -- professional equestrian; enjoy dogs pet; tennis; skiing  Recent infectious disease serology: 05/09/2021 Rpr 1:2 -- has been at this level for the past several years dating back to 2018 Hep a Ab nr; hep b sAb reactive; hep b cAb reactive; hep b sAg NR; hep c Ab nr Hiv rna 2610; hiv antibody negative Hiv genotype -- no bictegravir, carbotegravir, eltegravir resistance; RT RAM r211a, without any phenotypic resistance; PR RAM e35d and v77i, without protease inhibitor resistance Quantiferon gold negative  06/07/2021 Hiv rna negative; cd4 966 Hiv ab negative Triple screen gc/chlamydia negative  09/28/21 id clinic f/u Sex 10 days ago broke condom-- that person has gonorrhea (texted him). No sx. Wants testing. No oral/anal receptive sex Doing well Missed a dose of symtuza the last several weeks No complaint  Wants to get back on cabenuva   01/09/22 id clinic f/u Genotype testing with hiv transmission while on cabenuva done --> no ISI resistance or rilpivirine resistance He had restarted cabenuva 09/28/2021; his last shot was about almost 6 weeks ago 11/02/21 virologic remains undetectable No complaint today  No recent sexual exposure   06/06/22 id clinic f/u Patient had missed  his last q75month cabenuva injection; he last received it 03/06/2022 He has been taking symtuza in place of cabenuva. He wants to get back to cabenuva  He is functionally cured for hep b He wants to continue cabenuva -- discuss lack of antihep b activity and he is ok. No clear risk for reactivation  Stressed out preparing  for horse competition. Father dementia passed away 3 years ago Not suicidal or homicidal  Social - not sexually active (last encounter 1 month). No anal receptive sex. Retired Charity fundraiser. No current job  No health concern  No f/c, weight loss, cough, n/v/diarrhea, rash, joint pain    01/29/23 id clinic f/u Patient has kidney stones 4 episodes. 2 weeks ago has similar ache/pain that started in the lower right flank but then would travel down to the right groin/testicle. No fever chill. Some nausea. Takes naproxen. No blood in urine/urgency/frequency. He has been drinking lots of coffee  Doing well from hiv standpoint on cabenuva. Had to restart cabenuva due to insurance issue/lapse back in 04/2022  No other concern  Reviewed labs from 11/2022 std testing negative; serofast on syphilis rpr; hiv undetectable  Will see pcp for colon cancer/prostate cancer f/u     Review of Systems: ROS All other ROS negative      Past Medical History:  Diagnosis Date   Allergic rhinitis, cause unspecified 10/26/2010   Allergy    DDD (degenerative disc disease), cervical 10/26/2010   DDD (degenerative disc disease), lumbar 10/26/2010   Depression    Generalized headaches    GERD (gastroesophageal reflux disease)    HIV disease (HCC) 06/06/2021   HTN (hypertension) 10/26/2010   Hypertension    Kidney stones    Panic attack    Vaccine counseling 06/06/2021    Social History   Tobacco Use   Smoking status: Former    Current packs/day: 0.00    Types: Cigarettes    Quit date: 03/20/2014    Years since quitting: 8.8   Smokeless tobacco: Never  Vaping Use   Vaping status: Never Used  Substance Use Topics   Alcohol use: Yes    Alcohol/week: 6.0 standard drinks of alcohol    Types: 6 Cans of beer per week    Comment: occasionally   Drug use: Yes    Types: Marijuana    Comment: THC-daily    Family History  Problem Relation Age of Onset   Hyperlipidemia Other    Heart disease Other     Stroke Other    Hypertension Other    Diabetes Other    Cancer Other        prostate cancer   Diabetes Other    Alcohol abuse Other    Cancer Other        breast cancer   Stroke Other    Colon cancer Neg Hx    Esophageal cancer Neg Hx    Pancreatic cancer Neg Hx    Rectal cancer Neg Hx    Stomach cancer Neg Hx     Allergies  Allergen Reactions   Gadolinium Derivatives Hives   Sulfur Dioxide Nausea And Vomiting   Isovue [Iopamidol] Hives and Itching   Sulfa Antibiotics Nausea And Vomiting    OBJECTIVE: Vitals:   01/29/23 0929  BP: (!) 145/80  Pulse: 80  Temp: (!) 97.5 F (36.4 C)  TempSrc: Oral  SpO2: 96%  Weight: 152 lb (68.9 kg)   Body mass index is 22.45 kg/m.   Physical Exam General/constitutional: no distress,  pleasant HEENT: Normocephalic, PER, Conj Clear, EOMI, Oropharynx clear Neck supple CV: rrr no mrg Lungs: clear to auscultation, normal respiratory effort Abd: Soft, Nontender Ext: no edema Skin: No Rash Neuro: nonfocal MSK: no peripheral joint swelling/tenderness/warmth; back spines nontender           Lab: Lab Results  Component Value Date   WBC 4.8 06/06/2022   HGB 13.8 06/06/2022   HCT 40.4 06/06/2022   MCV 87.8 06/06/2022   PLT 228 06/06/2022   Last metabolic panel Lab Results  Component Value Date   GLUCOSE 99 06/06/2022   NA 136 06/06/2022   K 4.0 06/06/2022   CL 102 06/06/2022   CO2 26 06/06/2022   BUN 15 06/06/2022   CREATININE 1.32 06/06/2022   EGFR 60 06/06/2022   CALCIUM 9.5 06/06/2022   PHOS 2.1 (L) 08/20/2019   PROT 7.3 06/06/2022   ALBUMIN 4.5 04/02/2018   BILITOT 0.4 06/06/2022   ALKPHOS 82 04/02/2018   AST 16 06/06/2022   ALT 7 (L) 06/06/2022   ANIONGAP 6 04/20/2021     HIV: 11/2022      <20      /     1120 (37%) 12/2021       <20 10/2021       <20 09/2021       <20   Microbiology:  Serology:  Imaging:   Assessment/plan: Problem List Items Addressed This Visit       Other   HIV  disease (HCC) - Primary   Relevant Medications   cabotegravir & rilpivirine ER (CABENUVA) 600 & 900 MG/3ML injection 1 kit (Start on 01/29/2023 10:15 AM)   Other Visit Diagnoses     Flank pain       History of kidney stones       Relevant Orders   Urinalysis, Routine w reflex microscopic   Right lower quadrant abdominal pain       Relevant Orders   CT RENAL STONE STUDY   Screening for STDs (sexually transmitted diseases)           #hiv #cad risk Appears to have an acute retroviral syndrome early 04/2021 after he missed dose of appretude 03/2021 (only 1 missed dose) His viral load was 2600 when a genotype was done and showed no rilpivirine mutation or cabotegravir mutation I am unclear if at this time his viral load undetectable an archived genotype would be helpful  He was started on symtuza since 05/2021 and is currently undetectable  He prefers injectable for treatment   It is unclear why he contracted hiv given the duration of appretude and no evidence cabutegravir resistance (albeit no archive genotype available)  If archive is not able to be done, could consider dovato/biktarvy or cabenuva as next medication given improved DDI profile over symtuza. Will discuss this with Dr Daiva Eves. Close monitoring would be required initially given he contracted hiv while on appretude to detect integrase strand inhibitor resistance  Archive monogram 2/15 was negative for integrase resistance  He has been on symtuza but wants go go back on injection  Back on cabenuva in June. Remains well controlled on it He is on q-43months injection   06/06/22 taking symtuza since he missed cabenuva in 04/2022; wants to get back. Prior functionally cured hep b    01/2023 doing well on cabenuva  -discussed u=u -encourage compliance -continue current HIV medication -labs 6 months on f/u; continue pharmacy visit for cabenuva -continue lipitor 40 mg    #hx  syphilis Rpr serofast at 1:2 to 1:1 since  2018. 1980s treatment once as early latent  -rpr and urine/oral swab today for gc/chlam   #hcm -vaccination Shingrix -- rx given 01/09/22 Meningococcal -- due for for meningococcal booster S/p prevnar20 05/2021 Zoster -- rx given 2 shot series; patient to bring rx here and nursing can help administer (2nd shot 2-6 months from first shot) Tdap 2018 -hepatitis Prior hep b infection -- functionally cured; check level today and sAg 12/2021 hep c ab negative -- rescreen today -std screen negative 11/2022 -annual tb screen negative 05/2021 -cancer screening  Colonoscopy negative 2021 (polyps noncancer) - due every 5 years for now Prostate psa screening previously negative - will see his pcp again this year -- advise to set this up   #flank/groin pain right side #hx stone Seems like another bout  Ct scan Ua F/u pcp Will refer to urology if ct scan abnormal      Follow-up: Return in about 6 months (around 07/30/2023).  Raymondo Band, MD [facility] for Infectious Disease [facility] [phone] pager   [phone] cell 01/29/2023, 9:57 AM

## 2023-01-29 NOTE — Patient Instructions (Signed)
F/u with me in 6-9 months  See pharmacy for cabenuva  Talk to your pcp about the groin pain/stone hx   Front desk will assist ct scan set up; urine testing today   Agree with continue nsaids    If I see anything abnormal on the ct in terms of stone/obstruction I'll refer to urology

## 2023-01-30 ENCOUNTER — Telehealth: Payer: Self-pay

## 2023-01-30 LAB — URINALYSIS, ROUTINE W REFLEX MICROSCOPIC
Bilirubin Urine: NEGATIVE
Glucose, UA: NEGATIVE
Hgb urine dipstick: NEGATIVE
Leukocytes,Ua: NEGATIVE
Nitrite: NEGATIVE
Protein, ur: NEGATIVE
Specific Gravity, Urine: 1.02 (ref 1.001–1.035)
pH: 6 (ref 5.0–8.0)

## 2023-01-30 NOTE — Telephone Encounter (Signed)
Patient called today regarding CT appt on 10/17. Patient states he feels like he needs to be sedated for this. Is using Hydrologist.  Juanita Laster, RMA

## 2023-01-31 ENCOUNTER — Other Ambulatory Visit: Payer: Self-pay | Admitting: Family

## 2023-01-31 MED ORDER — LORAZEPAM 1 MG PO TABS
ORAL_TABLET | ORAL | 0 refills | Status: DC
Start: 2023-01-31 — End: 2023-06-20

## 2023-01-31 NOTE — Progress Notes (Signed)
Received request for anxiolytic needed prior to imaging.  Controlled Substance Database reviewed. Will provide 0.5-1 mg lorazepam PO as needed before imaging.   Marcos Eke, NP 01/31/2023 1:54 PM

## 2023-02-05 ENCOUNTER — Other Ambulatory Visit (HOSPITAL_COMMUNITY): Payer: Self-pay

## 2023-02-07 ENCOUNTER — Ambulatory Visit (HOSPITAL_COMMUNITY)
Admission: RE | Admit: 2023-02-07 | Discharge: 2023-02-07 | Disposition: A | Discharge status: Home / Self Care | Payer: Medicare HMO | Source: Ambulatory Visit | Attending: Internal Medicine | Admitting: Internal Medicine

## 2023-02-07 DIAGNOSIS — R109 Unspecified abdominal pain: Secondary | ICD-10-CM | POA: Diagnosis not present

## 2023-02-07 DIAGNOSIS — N200 Calculus of kidney: Secondary | ICD-10-CM | POA: Diagnosis not present

## 2023-02-07 DIAGNOSIS — R1031 Right lower quadrant pain: Secondary | ICD-10-CM | POA: Insufficient documentation

## 2023-03-19 ENCOUNTER — Telehealth: Payer: Self-pay

## 2023-03-19 ENCOUNTER — Other Ambulatory Visit (HOSPITAL_COMMUNITY): Payer: Self-pay

## 2023-03-19 NOTE — Telephone Encounter (Signed)
Pharmacy Patient Advocate Encounter- Cabenuva BIV-Pharmacy Benefit:  PA was submitted to [facility] and has been approved through: 12/23/22-04/23/23 unless we notify you otherwise. Authorization# [id number]  Please send prescription to Specialty Pharmacy:  [facility] Estimated Copay is: 1620.80  Patient have SPAP which will make the copay $0.00

## 2023-03-19 NOTE — Telephone Encounter (Signed)
[facility] Patient Advocate Encounter   Received notification from [facility] that prior authorization for Mason Morales is required.   PA submitted on 03/19/23 Key [id number] Status is pending  Once approved call [facility] [phone].    [facility] Clinic will continue to follow.   Clearance [name], CPhT Specialty Pharmacy Patient [facility] for Infectious Disease Phone: [phone] Fax:  [phone]

## 2023-03-19 NOTE — Telephone Encounter (Signed)
Patient called stating that he is in a lot of pain due to Chronic right scrotal hydrocele and wanted to see if provider could prescribe pain medication. Per Dr.Vu patient will need to contact PCP office regarding pain medication and to be referred to urology.   Patient aware and voiced his understanding.    Sheria Rosello Lesli Albee, CMA

## 2023-03-28 ENCOUNTER — Telehealth: Payer: Self-pay

## 2023-03-28 NOTE — Telephone Encounter (Signed)
[facility] Patient Advocate Encounter  Patient's medications CABENUVA have been couriered to [facility] from [facility] and will be administered at the patients appointment on 04/04/23.  Kae Heller, CPhT Specialty Pharmacy Patient [facility] for Infectious Disease Phone: [phone] Fax:  [phone]

## 2023-04-02 NOTE — Progress Notes (Unsigned)
HPI: Mason Morales is a 67 y.o. male who presents to the [facility] pharmacy clinic for [place] administration.  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   Chronic constipation 07/22/2019   Left lumbar radiculopathy 03/26/2017   Acute sinus infection 12/14/2016   Pelvic pain 08/24/2016   Left cervical radiculopathy 05/20/2015   Anxiety and depression 05/20/2015   GERD (gastroesophageal reflux disease) 10/26/2010   Allergic rhinitis 10/26/2010   HTN (hypertension) 10/26/2010   Kidney stones 10/26/2010   DDD (degenerative disc disease), cervical 10/26/2010   DDD (degenerative disc disease), lumbar 10/26/2010   Left shoulder pain 10/26/2010   Preventative health care 10/26/2010    Patient's Medications  New Prescriptions   No medications on file  Previous Medications   ACETAMINOPHEN (TYLENOL) 325 MG TABLET    Take 2 tablets (650 mg total) by mouth every 6 (six) hours as needed. Do not take more than 4000mg  of tylenol per day   AMLODIPINE (NORVASC) 10 MG TABLET    Take 1 tablet (10 mg total) by mouth daily.   ASPIRIN 81 MG EC TABLET    Take 1 tablet (81 mg total) by mouth daily. Swallow whole.   ATORVASTATIN (LIPITOR) 40 MG TABLET    Take 1 tablet (40 mg total) by mouth daily.   CABOTEGRAVIR & RILPIVIRINE ER (CABENUVA) 600 & 900 MG/3ML INJECTION    Inject 1 kit into the muscle every 2 (two) months.   CABOTEGRAVIR & RILPIVIRINE ER (CABENUVA) 600 & 900 MG/3ML INJECTION    Inject 1 kit into the muscle every 2 (two) months.   CITALOPRAM (CELEXA) 20 MG TABLET    TAKE 1 TABLET(20 MG) BY MOUTH DAILY   DICYCLOMINE (BENTYL) 20 MG TABLET    Take 1 tablet (20 mg total) by mouth 2 (two) times daily.   FAMOTIDINE (PEPCID) 20 MG TABLET    Take 1 tablet (20 mg total) by mouth 2 (two) times daily.   LACTULOSE 20 GM/30ML  SOLN    30 ml by mouth up to three times daily   LORAZEPAM (ATIVAN) 1 MG TABLET    Take 0.5 - 1 tablet by mouth as needed prior to CT scan.   MELOXICAM (MOBIC) 15 MG TABLET    Take 1 tablet (15 mg total) by mouth daily as needed for pain.   MUPIROCIN CREAM (BACTROBAN) 2 %    Apply 1 application topically 2 (two) times daily.   OMEPRAZOLE (PRILOSEC) 20 MG CAPSULE    Take 1 capsule (20 mg total) by mouth daily as needed (for heartburn).  Modified Medications   No medications on file  Discontinued Medications   No medications on file    Allergies: Allergies  Allergen Reactions   Gadolinium Derivatives Hives   Sulfur Dioxide Nausea And Vomiting   Isovue [Iopamidol] Hives and Itching   Sulfa Antibiotics Nausea And Vomiting    Labs: Lab Results  Component Value Date   HIV1RNAQUANT Not Detected 12/05/2022   HIV1RNAQUANT Not Detected 10/03/2022   HIV1RNAQUANT Not Detected 09/05/2022   CD4TABS 1,124 12/05/2022    RPR and STI Lab Results  Component Value Date   LABRPR REACTIVE (A) 12/05/2022   LABRPR REACTIVE (A) 06/06/2022   LABRPR REACTIVE (A) 01/09/2022   LABRPR REACTIVE (A) 09/28/2021   LABRPR REACTIVE (A) 06/07/2021   RPRTITER  1:1 (H) 12/05/2022   RPRTITER 1:1 (H) 06/06/2022   RPRTITER 1:1 (H) 01/09/2022   RPRTITER 1:2 (H) 09/28/2021   RPRTITER 1:2 (H) 06/07/2021    STI Results GC GC CT CT  12/05/2022  9:58 AM Negative    Negative   Negative    Negative    06/06/2022  4:10 PM Negative    Negative   Negative    Negative    01/09/2022 10:17 AM Negative    Negative   Negative    Negative    09/28/2021 11:18 AM Negative   Negative    05/09/2021 12:25 PM Negative   Negative    02/13/2021  3:57 PM Negative    Negative   Negative    Negative    12/28/2020  2:31 PM Negative    Negative    Negative   Negative    Negative    Negative    11/08/2016  8:15 AM  NOT DETECTED   NOT DETECTED     Hepatitis B Lab Results  Component Value Date   HEPBSAB REACTIVE (A)  05/09/2021   HEPBSAG NON-REACTIVE 06/06/2022   HEPBCAB REACTIVE (A) 05/09/2021   Hepatitis C Lab Results  Component Value Date   HEPCAB NON-REACTIVE 06/06/2022   Hepatitis A Lab Results  Component Value Date   HAV NON-REACTIVE 05/09/2021   Lipids: Lab Results  Component Value Date   CHOL 200 (H) 05/09/2021   TRIG 120 05/09/2021   HDL 60 05/09/2021   CHOLHDL 3.3 05/09/2021   VLDL 84.1 04/02/2018   LDLCALC 117 (H) 05/09/2021    TARGET DATE: The 15th  Assessment: Mason Morales presents today for his maintenance Cabenuva injections. Past injections were tolerated well without issues. Last HIV RNA was undetectable in August.  Administered cabotegravir 600mg /59mL in left upper outer quadrant of the gluteal muscle. Administered rilpivirine 900 mg/45mL in the right upper outer quadrant of the gluteal muscle. No issues with injections. He will follow up in 2 months for next set of injections.  Plan: - Cabenuva injections administered - Next injections scheduled for 06/04/23 with me and 07/31/23 with Dr. Renold Don  - Call with any issues or questions  Joshua Soulier L. Juliani Laduke, PharmD, BCIDP, AAHIVP, CPP Clinical Pharmacist Practitioner Infectious Diseases Clinical Pharmacist [facility]

## 2023-04-03 ENCOUNTER — Ambulatory Visit: Payer: Medicare HMO | Admitting: Pharmacist

## 2023-04-03 ENCOUNTER — Other Ambulatory Visit: Payer: Self-pay

## 2023-04-03 DIAGNOSIS — B20 Human immunodeficiency virus [HIV] disease: Secondary | ICD-10-CM | POA: Diagnosis not present

## 2023-04-03 MED ORDER — CABOTEGRAVIR & RILPIVIRINE ER 600 & 900 MG/3ML IM SUER
1.0000 | Freq: Once | INTRAMUSCULAR | Status: AC
Start: 2023-04-03 — End: 2023-04-03
  Administered 2023-04-03: 1 via INTRAMUSCULAR

## 2023-04-04 ENCOUNTER — Ambulatory Visit: Payer: Self-pay | Admitting: Pharmacist

## 2023-04-08 ENCOUNTER — Ambulatory Visit (INDEPENDENT_AMBULATORY_CARE_PROVIDER_SITE_OTHER): Payer: Medicare HMO | Admitting: Internal Medicine

## 2023-04-08 ENCOUNTER — Encounter: Payer: Self-pay | Admitting: Internal Medicine

## 2023-04-08 VITALS — BP 138/86 | HR 80 | Temp 98.3°F | Ht 69.0 in | Wt 159.0 lb

## 2023-04-08 DIAGNOSIS — E7800 Pure hypercholesterolemia, unspecified: Secondary | ICD-10-CM

## 2023-04-08 DIAGNOSIS — E538 Deficiency of other specified B group vitamins: Secondary | ICD-10-CM | POA: Diagnosis not present

## 2023-04-08 DIAGNOSIS — E559 Vitamin D deficiency, unspecified: Secondary | ICD-10-CM | POA: Diagnosis not present

## 2023-04-08 DIAGNOSIS — N50811 Right testicular pain: Secondary | ICD-10-CM | POA: Diagnosis not present

## 2023-04-08 DIAGNOSIS — N433 Hydrocele, unspecified: Secondary | ICD-10-CM

## 2023-04-08 DIAGNOSIS — I10 Essential (primary) hypertension: Secondary | ICD-10-CM

## 2023-04-08 DIAGNOSIS — R739 Hyperglycemia, unspecified: Secondary | ICD-10-CM | POA: Diagnosis not present

## 2023-04-08 DIAGNOSIS — Z0000 Encounter for general adult medical examination without abnormal findings: Secondary | ICD-10-CM

## 2023-04-08 DIAGNOSIS — F419 Anxiety disorder, unspecified: Secondary | ICD-10-CM

## 2023-04-08 DIAGNOSIS — Z8673 Personal history of transient ischemic attack (TIA), and cerebral infarction without residual deficits: Secondary | ICD-10-CM | POA: Insufficient documentation

## 2023-04-08 DIAGNOSIS — Z125 Encounter for screening for malignant neoplasm of prostate: Secondary | ICD-10-CM

## 2023-04-08 DIAGNOSIS — Z0001 Encounter for general adult medical examination with abnormal findings: Secondary | ICD-10-CM | POA: Insufficient documentation

## 2023-04-08 DIAGNOSIS — F32A Depression, unspecified: Secondary | ICD-10-CM

## 2023-04-08 DIAGNOSIS — K219 Gastro-esophageal reflux disease without esophagitis: Secondary | ICD-10-CM | POA: Diagnosis not present

## 2023-04-08 LAB — BASIC METABOLIC PANEL
BUN: 13 mg/dL (ref 6–23)
CO2: 28 meq/L (ref 19–32)
Calcium: 9.2 mg/dL (ref 8.4–10.5)
Chloride: 103 meq/L (ref 96–112)
Creatinine, Ser: 1.21 mg/dL (ref 0.40–1.50)
GFR: 62.86 mL/min (ref >60.00)
Glucose, Bld: 87 mg/dL (ref 70–99)
Potassium: 4.1 meq/L (ref 3.5–5.1)
Sodium: 139 meq/L (ref 135–145)

## 2023-04-08 LAB — CBC WITH DIFFERENTIAL/PLATELET
Basophils Absolute: 0 10*3/uL (ref 0.0–0.1)
Basophils Relative: 0.6 % (ref 0.0–3.0)
Eosinophils Absolute: 0.2 10*3/uL (ref 0.0–0.7)
Eosinophils Relative: 3.1 % (ref 0.0–5.0)
HCT: 40.5 % (ref 39.0–52.0)
Hemoglobin: 13.6 g/dL (ref 13.0–17.0)
Lymphocytes Relative: 50.7 % — ABNORMAL HIGH (ref 12.0–46.0)
Lymphs Abs: 2.5 10*3/uL (ref 0.7–4.0)
MCHC: 33.6 g/dL (ref 30.0–36.0)
MCV: 88.8 fL (ref 78.0–100.0)
Monocytes Absolute: 0.3 10*3/uL (ref 0.1–1.0)
Monocytes Relative: 6.1 % (ref 3.0–12.0)
Neutro Abs: 1.9 10*3/uL (ref 1.4–7.7)
Neutrophils Relative %: 39.5 % — ABNORMAL LOW (ref 43.0–77.0)
Platelets: 244 10*3/uL (ref 150.0–400.0)
RBC: 4.56 Mil/uL (ref 4.22–5.81)
RDW: 13.6 % (ref 11.5–15.5)
WBC: 4.9 10*3/uL (ref 4.0–10.5)

## 2023-04-08 LAB — HEPATIC FUNCTION PANEL
ALT: 12 U/L (ref 0–53)
AST: 19 U/L (ref 0–37)
Albumin: 4.2 g/dL (ref 3.5–5.2)
Alkaline Phosphatase: 81 U/L (ref 39–117)
Bilirubin, Direct: 0.1 mg/dL (ref 0.0–0.3)
Total Bilirubin: 0.4 mg/dL (ref 0.2–1.2)
Total Protein: 7.1 g/dL (ref 6.0–8.3)

## 2023-04-08 LAB — VITAMIN B12: Vitamin B-12: 610 pg/mL (ref 211–911)

## 2023-04-08 LAB — LIPID PANEL
Cholesterol: 141 mg/dL (ref 0–200)
HDL: 56.6 mg/dL (ref >39.00)
LDL Cholesterol: 62 mg/dL (ref 0–99)
NonHDL: 84.4
Total CHOL/HDL Ratio: 2
Triglycerides: 113 mg/dL (ref 0.0–149.0)
VLDL: 22.6 mg/dL (ref 0.0–40.0)

## 2023-04-08 LAB — VITAMIN D 25 HYDROXY (VIT D DEFICIENCY, FRACTURES): VITD: 15.54 ng/mL — ABNORMAL LOW (ref 30.00–100.00)

## 2023-04-08 LAB — TSH: TSH: 1.06 u[IU]/mL (ref 0.35–5.50)

## 2023-04-08 LAB — PSA: PSA: 1.11 ng/mL (ref 0.10–4.00)

## 2023-04-08 LAB — HEMOGLOBIN A1C: Hgb A1c MFr Bld: 5.9 % (ref 4.6–6.5)

## 2023-04-08 MED ORDER — TRAMADOL HCL 50 MG PO TABS: 50.0000 mg | ORAL_TABLET | Freq: Four times a day (QID) | ORAL | 0 refills | Status: DC | PRN

## 2023-04-08 NOTE — Patient Instructions (Addendum)
Please have your Shingrix (shingles) shots done at your local pharmacy.  Please take all new medication as prescribed  - the tramadol for pain  Please continue all other medications as before, and refills have been done if requested.  Please have the pharmacy call with any other refills you may need.  Please continue your efforts at being more active, low cholesterol diet, and weight control.  You are otherwise up to date with prevention measures today.  Please keep your appointments with your specialists as you may have planned  You will be contacted regarding the referral for: urology  Please go to the LAB at the blood drawing area for the tests to be done  You will be contacted by phone if any changes need to be made immediately.  Otherwise, you will receive a letter about your results with an explanation, but please check with MyChart first.  Please make an Appointment to return in 6 months, or sooner if needed

## 2023-04-08 NOTE — Assessment & Plan Note (Signed)
No results found for: "HGBA1C" Stable, pt to continue current medical treatment  - diet, wt control

## 2023-04-08 NOTE — Assessment & Plan Note (Signed)
With pain, for tramadol prn, refer urology

## 2023-04-08 NOTE — Assessment & Plan Note (Signed)
BP Readings from Last 3 Encounters:  04/08/23 138/86  01/29/23 (!) 143/80  08/29/22 (!) 178/80   Stable, pt to continue medical treatment norvasc 10 qd

## 2023-04-08 NOTE — Progress Notes (Addendum)
Patient ID: Mason Morales, male   DOB: Sep 13, 1955, 67 y.o.   MRN: [id number]         Chief Complaint:: wellness exam and Medical Management of Chronic Issues (Wants referral )  , hyperglycemia, hld, hydrocele with pain, anxiety depression       HPI:  Mason Morales is a 67 y.o. male here for wellness exam; for shingrix at pharmacy, o/w up to date                        Also Pt denies chest pain, increased sob or doe, wheezing, orthopnea, PND, increased LE swelling, palpitations, dizziness or syncope.   Pt denies polydipsia, polyuria, or new focal neuro s/s.    Pt denies fever, wt loss, night sweats, loss of appetite, or other constitutional symptoms  Has persistent right scrotal pain and swelling with recent CT c/w hydrocele, pt asking now for urology referral.  Denies worsening depressive symptoms, suicidal ideation, or panic; has ongoing anxiety Follows with ID regularly.  Denies worsening reflux, abd pain, dysphagia, n/v, bowel change or blood.   Wt Readings from Last 3 Encounters:  04/08/23 159 lb (72.1 kg)  01/29/23 152 lb (68.9 kg)  08/29/22 152 lb (68.9 kg)   BP Readings from Last 3 Encounters:  04/08/23 138/86  01/29/23 (!) 143/80  08/29/22 (!) 178/80   Immunization History  Administered Date(s) Administered   Fluad Trivalent(High Dose 65+) 01/29/2023   Influenza,inj,Quad PF,6+ Mos 12/14/2016, 04/02/2018, 06/07/2021, 03/06/2022   PFIZER Comirnaty(Gray Top)Covid-19 Tri-Sucrose Vaccine 11/10/2019, 12/30/2019   PNEUMOCOCCAL CONJUGATE-20 06/07/2021   Pfizer Covid-19 Vaccine Bivalent Booster 69yrs & up 06/07/2021   Pfizer(Comirnaty)Fall Seasonal Vaccine 12 years and older 03/06/2022, 01/29/2023   Tdap 12/14/2016   Health Maintenance Due  Topic Date Due   Medicare Annual Wellness (AWV)  Never done   Zoster Vaccines- Shingrix (1 of 2) Never done      Past Medical History:  Diagnosis Date   Allergic rhinitis, cause unspecified 10/26/2010   Allergy    DDD (degenerative disc  disease), cervical 10/26/2010   DDD (degenerative disc disease), lumbar 10/26/2010   Depression    Generalized headaches    GERD (gastroesophageal reflux disease)    HIV disease (HCC) 06/06/2021   HTN (hypertension) 10/26/2010   Hypertension    Kidney stones    Panic attack    Vaccine counseling 06/06/2021   Past Surgical History:  Procedure Laterality Date   MOUTH SURGERY  at 67 yo after horse accident    reports that he quit smoking about 9 years ago. His smoking use included cigarettes. He has never used smokeless tobacco. He reports current alcohol use of about 6.0 standard drinks of alcohol per week. He reports current drug use. Drug: Marijuana. family history includes Alcohol abuse in an other family member; Cancer in some other family members; Diabetes in some other family members; Heart disease in an other family member; Hyperlipidemia in an other family member; Hypertension in an other family member; Stroke in some other family members. Allergies  Allergen Reactions   Gadolinium Derivatives Hives   Sulfur Dioxide Nausea And Vomiting   Isovue [Iopamidol] Hives and Itching   Sulfa Antibiotics Nausea And Vomiting   Current Outpatient Medications on File Prior to Visit  Medication Sig Dispense Refill   amLODipine (NORVASC) 10 MG tablet Take 1 tablet (10 mg total) by mouth daily. 90 tablet 3   atorvastatin (LIPITOR) 40 MG tablet Take 1 tablet (40 mg  total) by mouth daily. 30 tablet 11   cabotegravir & rilpivirine ER (CABENUVA) 600 & 900 MG/3ML injection Inject 1 kit into the muscle every 2 (two) months. 6 mL 5   cabotegravir & rilpivirine ER (CABENUVA) 600 & 900 MG/3ML injection Inject 1 kit into the muscle every 2 (two) months. 6 mL 5   citalopram (CELEXA) 20 MG tablet TAKE 1 TABLET(20 MG) BY MOUTH DAILY 90 tablet 3   LORazepam (ATIVAN) 1 MG tablet Take 0.5 - 1 tablet by mouth as needed prior to CT scan. 1 tablet 0   No current facility-administered medications on file prior to  visit.        ROS:  All others reviewed and negative.  Objective        PE:  BP 138/86   Pulse 80   Temp 98.3 F (36.8 C)   Ht 5\' 9"  (1.753 m)   Wt 159 lb (72.1 kg)   SpO2 99%   BMI 23.48 kg/m                 Constitutional: Pt appears in NAD               HENT: Head: NCAT.                Right Ear: External ear normal.                 Left Ear: External ear normal.                Eyes: . Pupils are equal, round, and reactive to light. Conjunctivae and EOM are normal               Nose: without d/c or deformity               Neck: Neck supple. Gross normal ROM               Cardiovascular: Normal rate and regular rhythm.                 Pulmonary/Chest: Effort normal and breath sounds without rales or wheezing.                Abd:  Soft, NT, ND, + BS, no organomegaly; right scrotal area with tender swelling               Neurological: Pt is alert. At baseline orientation, motor grossly intact               Skin: Skin is warm. No rashes, no other new lesions, LE edema - none               Psychiatric: Pt behavior is normal without agitation   Micro: none  Cardiac tracings I have personally interpreted today:  none  Pertinent Radiological findings (summarize): none   Lab Results  Component Value Date   WBC 4.9 04/08/2023   HGB 13.6 04/08/2023   HCT 40.5 04/08/2023   PLT 244.0 04/08/2023   GLUCOSE 87 04/08/2023   CHOL 141 04/08/2023   TRIG 113.0 04/08/2023   HDL 56.60 04/08/2023   LDLDIRECT 89.0 12/30/2015   LDLCALC 62 04/08/2023   ALT 12 04/08/2023   AST 19 04/08/2023   NA 139 04/08/2023   K 4.1 04/08/2023   CL 103 04/08/2023   CREATININE 1.21 04/08/2023   BUN 13 04/08/2023   CO2 28 04/08/2023   TSH 1.06 04/08/2023   PSA 1.11 04/08/2023   INR 1.0 04/20/2021  HGBA1C 5.9 04/08/2023   Assessment/Plan:  Mason Morales is a 67 y.o. Black or African American [2] male with  has a past medical history of Allergic rhinitis, cause unspecified (10/26/2010), Allergy,  DDD (degenerative disc disease), cervical (10/26/2010), DDD (degenerative disc disease), lumbar (10/26/2010), Depression, Generalized headaches, GERD (gastroesophageal reflux disease), HIV disease (HCC) (06/06/2021), HTN (hypertension) (10/26/2010), Hypertension, Kidney stones, Panic attack, and Vaccine counseling (06/06/2021).  Encounter for well adult exam with abnormal findings Age and sex appropriate education and counseling updated with regular exercise and diet Referrals for preventative services - none needed Immunizations addressed - for shingrix at pharmacy Smoking counseling  - none needed Evidence for depression or other mood disorder - none significant Most recent labs reviewed. I have personally reviewed and have noted: 1) the patient's medical and social history 2) The patient's current medications and supplements 3) The patient's height, weight, and BMI have been recorded in the chart   HTN (hypertension) BP Readings from Last 3 Encounters:  04/08/23 138/86  01/29/23 (!) 143/80  08/29/22 (!) 178/80   Stable, pt to continue medical treatment norvasc 10 qd   Anxiety and depression Stable overall, cont celexa 20 every day,  GERD (gastroesophageal reflux disease) Stable, continue pepcid 20 mg  Pure hypercholesterolemia Lab Results  Component Value Date   LDLCALC 117 (H) 05/09/2021   Uncontrolled, for f/u lab, consider statin with hx of cerebellar stroke    Hyperglycemia No results found for: "HGBA1C" Stable, pt to continue current medical treatment  - diet, wt control  Hydrocele With pain, for tramadol prn, refer urology  Vitamin D deficiency Last vitamin D Lab Results  Component Value Date   VD25OH 15.54 (L) 04/08/2023   Low, to start oral replacement   Followup: Return in about 6 months (around 10/07/2023).  Oliver Barre, MD 04/09/2023 8:13 AM [facility]  Primary Care - [facility]

## 2023-04-08 NOTE — Assessment & Plan Note (Addendum)
Stable overall, cont celexa 20 every day,

## 2023-04-08 NOTE — Assessment & Plan Note (Signed)
Stable, continue pepcid 20 mg

## 2023-04-08 NOTE — Assessment & Plan Note (Signed)

## 2023-04-08 NOTE — Assessment & Plan Note (Signed)
Lab Results  Component Value Date   LDLCALC 117 (H) 05/09/2021   Uncontrolled, for f/u lab, consider statin with hx of cerebellar stroke

## 2023-04-09 DIAGNOSIS — E559 Vitamin D deficiency, unspecified: Secondary | ICD-10-CM | POA: Insufficient documentation

## 2023-04-09 LAB — URINALYSIS, ROUTINE W REFLEX MICROSCOPIC
Bilirubin Urine: NEGATIVE
Ketones, ur: NEGATIVE
Leukocytes,Ua: NEGATIVE
Nitrite: NEGATIVE
Specific Gravity, Urine: 1.02 (ref 1.000–1.030)
Total Protein, Urine: NEGATIVE
Urine Glucose: NEGATIVE
Urobilinogen, UA: 0.2 (ref 0.0–1.0)
WBC, UA: NONE SEEN (ref 0–?)
pH: 6 (ref 5.0–8.0)

## 2023-04-09 NOTE — Addendum Note (Signed)
Addended by: Corwin Levins on: 04/09/2023 08:13 AM   Modules accepted: Level of Service

## 2023-04-09 NOTE — Assessment & Plan Note (Signed)
Last vitamin D Lab Results  Component Value Date   VD25OH 15.54 (L) 04/08/2023   Low, to start oral replacement

## 2023-04-11 DIAGNOSIS — R69 Illness, unspecified: Secondary | ICD-10-CM | POA: Diagnosis not present

## 2023-05-14 ENCOUNTER — Other Ambulatory Visit (HOSPITAL_COMMUNITY): Payer: Self-pay

## 2023-05-28 ENCOUNTER — Telehealth: Payer: Self-pay

## 2023-05-28 NOTE — Telephone Encounter (Signed)
 [facility] Patient Advocate Encounter  Patient's medications CABENUVA  have been couriered to [facility] from [employer] and will be administered at the patients appointment on 06/04/23.  Charmaine Sharps, CPhT Specialty Pharmacy Patient [facility] for Infectious Disease Phone: [phone] Fax:  [phone]

## 2023-06-04 ENCOUNTER — Ambulatory Visit: Payer: Medicare HMO | Admitting: Pharmacist

## 2023-06-04 NOTE — Progress Notes (Unsigned)
HPI: Mason Morales is a 68 y.o. male who presents to the [facility] pharmacy clinic for [place] administration.  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   Chronic constipation 07/22/2019   Left lumbar radiculopathy 03/26/2017   Pelvic pain 08/24/2016   Left cervical radiculopathy 05/20/2015   Anxiety and depression 05/20/2015   GERD (gastroesophageal reflux disease) 10/26/2010   Allergic rhinitis 10/26/2010   HTN (hypertension) 10/26/2010   Kidney stones 10/26/2010   DDD (degenerative disc disease), cervical 10/26/2010   DDD (degenerative disc disease), lumbar 10/26/2010   Left shoulder pain 10/26/2010   Preventative health care 10/26/2010    Patient's Medications  New Prescriptions   No medications on file  Previous Medications   AMLODIPINE (NORVASC) 10 MG TABLET    Take 1 tablet (10 mg total) by mouth daily.   ATORVASTATIN (LIPITOR) 40 MG TABLET    Take 1 tablet (40 mg total) by mouth daily.   CABOTEGRAVIR & RILPIVIRINE ER (CABENUVA) 600 & 900 MG/3ML INJECTION    Inject 1 kit into the muscle every 2 (two) months.   CABOTEGRAVIR & RILPIVIRINE ER (CABENUVA) 600 & 900 MG/3ML INJECTION    Inject 1 kit into the muscle every 2 (two) months.   CITALOPRAM (CELEXA) 20 MG TABLET    TAKE 1 TABLET(20 MG) BY MOUTH DAILY   LORAZEPAM (ATIVAN) 1 MG TABLET    Take 0.5 - 1 tablet by mouth as needed prior to CT scan.   TRAMADOL (ULTRAM) 50 MG TABLET    Take 1 tablet (50 mg total) by mouth every 6 (six) hours as needed.  Modified Medications   No medications on file  Discontinued Medications   No  medications on file    Allergies: Allergies  Allergen Reactions   Gadolinium Derivatives Hives   Sulfur Dioxide Nausea And Vomiting   Isovue [Iopamidol] Hives and Itching   Sulfa Antibiotics Nausea And Vomiting    Labs: Lab Results  Component Value Date   HIV1RNAQUANT Not Detected 12/05/2022   HIV1RNAQUANT Not Detected 10/03/2022   HIV1RNAQUANT Not Detected 09/05/2022   CD4TABS 1,124 12/05/2022    RPR and STI Lab Results  Component Value Date   LABRPR REACTIVE (A) 12/05/2022   LABRPR REACTIVE (A) 06/06/2022   LABRPR REACTIVE (A) 01/09/2022   LABRPR REACTIVE (A) 09/28/2021   LABRPR REACTIVE (A) 06/07/2021   RPRTITER 1:1 (H) 12/05/2022   RPRTITER 1:1 (H) 06/06/2022   RPRTITER 1:1 (H) 01/09/2022   RPRTITER 1:2 (H) 09/28/2021   RPRTITER 1:2 (H) 06/07/2021    STI Results GC GC CT CT  12/05/2022  9:58 AM Negative    Negative   Negative    Negative    06/06/2022  4:10 PM Negative    Negative   Negative    Negative    01/09/2022 10:17 AM Negative    Negative   Negative    Negative    09/28/2021 11:18 AM Negative   Negative    05/09/2021 12:25 PM Negative   Negative    02/13/2021  3:57 PM Negative    Negative   Negative  Negative    12/28/2020  2:31 PM Negative    Negative    Negative   Negative    Negative    Negative    11/08/2016  8:15 AM  NOT DETECTED   NOT DETECTED     Hepatitis B Lab Results  Component Value Date   HEPBSAB REACTIVE (A) 05/09/2021   HEPBSAG NON-REACTIVE 06/06/2022   HEPBCAB REACTIVE (A) 05/09/2021   Hepatitis C Lab Results  Component Value Date   HEPCAB NON-REACTIVE 06/06/2022   Hepatitis A Lab Results  Component Value Date   HAV NON-REACTIVE 05/09/2021   Lipids: Lab Results  Component Value Date   CHOL 141 04/08/2023   TRIG 113.0 04/08/2023   HDL 56.60 04/08/2023   CHOLHDL 2 04/08/2023   VLDL 22.6 04/08/2023   LDLCALC 62 04/08/2023    TARGET DATE: July 30, 2023  Assessment: Hisao presents today for his  maintenance Cabenuva injections. Past injections were tolerated well without issues. Last HIV RNA was not detected on 12/05/2022.   Administered cabotegravir 600mg /6mL in left upper outer quadrant of the gluteal muscle. Administered rilpivirine 900 mg/61mL in the right upper outer quadrant of the gluteal muscle. No issues with injections. He will follow up in 2 months for next set of injections.  Plan: - Cabenuva injections administered - Next injections scheduled for 07/31/2023 - HIV RNA today - Call with any issues or questions  Buddy Duty, PharmD Student [facility] for [facility] Disease

## 2023-06-07 DIAGNOSIS — N430 Encysted hydrocele: Secondary | ICD-10-CM | POA: Diagnosis not present

## 2023-06-07 DIAGNOSIS — R3121 Asymptomatic microscopic hematuria: Secondary | ICD-10-CM | POA: Diagnosis not present

## 2023-06-10 NOTE — Progress Notes (Incomplete)
HPI: Mason Morales is a 68 y.o. male who presents to the [facility] pharmacy clinic for [place] administration.  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   Chronic constipation 07/22/2019   Left lumbar radiculopathy 03/26/2017   Pelvic pain 08/24/2016   Left cervical radiculopathy 05/20/2015   Anxiety and depression 05/20/2015   GERD (gastroesophageal reflux disease) 10/26/2010   Allergic rhinitis 10/26/2010   HTN (hypertension) 10/26/2010   Kidney stones 10/26/2010   DDD (degenerative disc disease), cervical 10/26/2010   DDD (degenerative disc disease), lumbar 10/26/2010   Left shoulder pain 10/26/2010   Preventative health care 10/26/2010    Patient's Medications  New Prescriptions   No medications on file  Previous Medications   AMLODIPINE (NORVASC) 10 MG TABLET    Take 1 tablet (10 mg total) by mouth daily.   ATORVASTATIN (LIPITOR) 40 MG TABLET    Take 1 tablet (40 mg total) by mouth daily.   CABOTEGRAVIR & RILPIVIRINE ER (CABENUVA) 600 & 900 MG/3ML INJECTION    Inject 1 kit into the muscle every 2 (two) months.   CABOTEGRAVIR & RILPIVIRINE ER (CABENUVA) 600 & 900 MG/3ML INJECTION    Inject 1 kit into the muscle every 2 (two) months.   CITALOPRAM (CELEXA) 20 MG TABLET    TAKE 1 TABLET(20 MG) BY MOUTH DAILY   LORAZEPAM (ATIVAN) 1 MG TABLET    Take 0.5 - 1 tablet by mouth as needed prior to CT scan.   TRAMADOL (ULTRAM) 50 MG TABLET    Take 1 tablet (50 mg total) by mouth every 6 (six) hours as needed.  Modified Medications   No medications on file  Discontinued Medications   No  medications on file    Allergies: Allergies  Allergen Reactions   Gadolinium Derivatives Hives   Sulfur Dioxide Nausea And Vomiting   Isovue [Iopamidol] Hives and Itching   Sulfa Antibiotics Nausea And Vomiting    Labs: Lab Results  Component Value Date   HIV1RNAQUANT Not Detected 12/05/2022   HIV1RNAQUANT Not Detected 10/03/2022   HIV1RNAQUANT Not Detected 09/05/2022   CD4TABS 1,124 12/05/2022    RPR and STI Lab Results  Component Value Date   LABRPR REACTIVE (A) 12/05/2022   LABRPR REACTIVE (A) 06/06/2022   LABRPR REACTIVE (A) 01/09/2022   LABRPR REACTIVE (A) 09/28/2021   LABRPR REACTIVE (A) 06/07/2021   RPRTITER 1:1 (H) 12/05/2022   RPRTITER 1:1 (H) 06/06/2022   RPRTITER 1:1 (H) 01/09/2022   RPRTITER 1:2 (H) 09/28/2021   RPRTITER 1:2 (H) 06/07/2021    STI Results GC GC CT CT  12/05/2022  9:58 AM Negative    Negative   Negative    Negative    06/06/2022  4:10 PM Negative    Negative   Negative    Negative    01/09/2022 10:17 AM Negative    Negative   Negative    Negative    09/28/2021 11:18 AM Negative   Negative    05/09/2021 12:25 PM Negative   Negative    02/13/2021  3:57 PM Negative    Negative   Negative  Negative    12/28/2020  2:31 PM Negative    Negative    Negative   Negative    Negative    Negative    11/08/2016  8:15 AM  NOT DETECTED   NOT DETECTED     Hepatitis B Lab Results  Component Value Date   HEPBSAB REACTIVE (A) 05/09/2021   HEPBSAG NON-REACTIVE 06/06/2022   HEPBCAB REACTIVE (A) 05/09/2021   Hepatitis C Lab Results  Component Value Date   HEPCAB NON-REACTIVE 06/06/2022   Hepatitis A Lab Results  Component Value Date   HAV NON-REACTIVE 05/09/2021   Lipids: Lab Results  Component Value Date   CHOL 141 04/08/2023   TRIG 113.0 04/08/2023   HDL 56.60 04/08/2023   CHOLHDL 2 04/08/2023   VLDL 22.6 04/08/2023   LDLCALC 62 04/08/2023    TARGET DATE: 15th  Assessment: Mason Morales presents today for 2 month maintenance  Cabenuva injections. Last seen by [facility] 04/02/24 for Cabenuva administration and was tolerated well without issues. No new partners since the last visit. Last HIV RNA was undetectable in August 2024. Will order HIV RNA today.   Administered cabotegravir 600mg /86mL in left upper outer quadrant of the gluteal muscle. Administered rilpivirine 900 mg/37mL in the right upper outer quadrant of the gluteal muscle. No issues with injections. Mason Morales will follow up in 2 months for next set of injections.  Mason Morales reports he recently saw the urologist last week for hydrocele and is waiting on a date for the surgery to be scheduled. He has been taking tylenol or ibuprofen which has been helping with the pain.   Eligible for HepA vaccinations today and administered first dose in right deltoid. Next and final dose of series due in 6-12 months (earliest 8/25).   Plan: - Cabenuva injections administered - HIV RNA - HepA vaccine 1/2 administered in R deltoid, next dose due in 6-12 months (earliest 8/25) - Next injections scheduled for 08/06/23 with Dr. Renold Don and 10/02/23 with Cassie - Call with any issues or questions  Stephenie Acres, PharmD PGY1 Pharmacy Resident 06/10/2023 1:12 PM

## 2023-06-11 ENCOUNTER — Other Ambulatory Visit: Payer: Self-pay

## 2023-06-11 ENCOUNTER — Ambulatory Visit (INDEPENDENT_AMBULATORY_CARE_PROVIDER_SITE_OTHER): Payer: Medicare HMO | Admitting: Pharmacist

## 2023-06-11 DIAGNOSIS — Z113 Encounter for screening for infections with a predominantly sexual mode of transmission: Secondary | ICD-10-CM

## 2023-06-11 DIAGNOSIS — Z23 Encounter for immunization: Secondary | ICD-10-CM | POA: Diagnosis not present

## 2023-06-11 DIAGNOSIS — B20 Human immunodeficiency virus [HIV] disease: Secondary | ICD-10-CM | POA: Diagnosis not present

## 2023-06-11 MED ORDER — CABOTEGRAVIR & RILPIVIRINE ER 600 & 900 MG/3ML IM SUER
1.0000 | Freq: Once | INTRAMUSCULAR | Status: AC
Start: 2023-06-11 — End: 2023-06-11
  Administered 2023-06-11: 1 via INTRAMUSCULAR

## 2023-06-13 ENCOUNTER — Other Ambulatory Visit: Payer: Self-pay | Admitting: Urology

## 2023-06-13 LAB — HIV-1 RNA QUANT-NO REFLEX-BLD
HIV 1 RNA Quant: NOT DETECTED {copies}/mL
HIV-1 RNA Quant, Log: NOT DETECTED {Log}

## 2023-06-20 NOTE — Patient Instructions (Signed)
 SURGICAL WAITING ROOM VISITATION Patients having surgery or a procedure may have no more than 2 support people in the waiting area - these visitors may rotate in the visitor waiting room.   Due to an increase in RSV and influenza rates and associated hospitalizations, children ages 32 and under may not visit patients in [facility] hospitals. If the patient needs to stay at the hospital during part of their recovery, the visitor guidelines for inpatient rooms apply.  PRE-OP VISITATION  Pre-op nurse will coordinate an appropriate time for 1 support person to accompany the patient in pre-op.  This support person may not rotate.  This visitor will be contacted when the time is appropriate for the visitor to come back in the pre-op area.  Please refer to the [facility] website for the visitor guidelines for Inpatients (after your surgery is over and you are in a regular room).  You are not required to quarantine at this time prior to your surgery. However, you must do this: Hand Hygiene often Do NOT share personal items Notify your provider if you are in close contact with someone who has COVID or you develop fever 100.4 or greater, new onset of sneezing, cough, sore throat, shortness of breath or body aches.  If you test positive for Covid or have been in contact with anyone that has tested positive in the last 10 days please notify you surgeon.    Your procedure is scheduled on:  Tuesday  June 25, 2023  Report to [facility] Main Entrance: [facility]sen entrance where the [employer] is available.   Report to admitting at:05:15     AM  Call this number if you have any questions or problems the morning of surgery [phone]  DO NOT EAT OR DRINK ANYTHING AFTER MIDNIGHT THE NIGHT PRIOR TO YOUR SURGERY / PROCEDURE.   FOLLOW  ANY ADDITIONAL PRE OP INSTRUCTIONS YOU RECEIVED FROM YOUR SURGEON'S OFFICE!!!   Oral Hygiene is also important to reduce your risk of infection.        Remember  - BRUSH YOUR TEETH THE MORNING OF SURGERY WITH YOUR REGULAR TOOTHPASTE  Do NOT smoke after Midnight the night before surgery.  STOP TAKING all Vitamins, Herbs and supplements 1 week before your surgery.   Take ONLY these medicines the morning of surgery with A SIP OF WATER: amlodipine, citalopram (Celexa)  If You have been diagnosed with Sleep Apnea - Bring CPAP mask and tubing day of surgery. We will provide you with a CPAP machine on the day of your surgery.                   You may not have any metal on your body including  jewelry, and body piercing  Do not wear  lotions, powders, cologne, or deodorant  Men may shave face and neck.  Contacts, Hearing Aids, dentures or bridgework may not be worn into surgery. DENTURES WILL BE REMOVED PRIOR TO SURGERY PLEASE DO NOT APPLY "Poly grip" OR ADHESIVES!!!  Patients discharged on the day of surgery will not be allowed to drive home.  Someone NEEDS to stay with you for the first 24 hours after anesthesia.  Do not bring your home medications to the hospital. The Pharmacy will dispense medications listed on your medication list to you during your admission in the Hospital.  Please read over the following fact sheets you were given: IF YOU HAVE QUESTIONS ABOUT YOUR PRE-OP INSTRUCTIONS, PLEASE CALL [phone].   [place] -  Preparing for Surgery Before surgery, you can play an important role.  Because skin is not sterile, your skin needs to be as free of germs as possible.  You can reduce the number of germs on your skin by washing with CHG (chlorahexidine gluconate) soap before surgery.  CHG is an antiseptic cleaner which kills germs and bonds with the skin to continue killing germs even after washing. Please DO NOT use if you have an allergy to CHG or antibacterial soaps.  If your skin becomes reddened/irritated stop using the CHG and inform your nurse when you arrive at Short Stay. Do not shave (including legs and underarms) for at least 48  hours prior to the first CHG shower.  You may shave your face/neck.  Please follow these instructions carefully:  1.  Shower with CHG Soap the night before surgery and the  morning of surgery.  2.  If you choose to wash your hair, wash your hair first as usual with your normal  shampoo.  3.  After you shampoo, rinse your hair and body thoroughly to remove the shampoo.                             4.  Use CHG as you would any other liquid soap.  You can apply chg directly to the skin and wash.  Gently with a scrungie or clean washcloth.  5.  Apply the CHG Soap to your body ONLY FROM THE NECK DOWN.   Do not use on face/ open                           Wound or open sores. Avoid contact with eyes, ears mouth and genitals (private parts).                       Wash face,  Genitals (private parts) with your normal soap.             6.  Wash thoroughly, paying special attention to the area where your  surgery  will be performed.  7.  Thoroughly rinse your body with warm water from the neck down.  8.  DO NOT shower/wash with your normal soap after using and rinsing off the CHG Soap.            9.  Pat yourself dry with a clean towel.            10.  Wear clean pajamas.            11.  Place clean sheets on your bed the night of your first shower and do not  sleep with pets.  ON THE DAY OF SURGERY : Do not apply any lotions/deodorants the morning of surgery.  Please wear clean clothes to the hospital/surgery center.    FAILURE TO FOLLOW THESE INSTRUCTIONS MAY RESULT IN THE CANCELLATION OF YOUR SURGERY  PATIENT SIGNATURE_________________________________  NURSE SIGNATURE__________________________________  ________________________________________________________________________

## 2023-06-20 NOTE — Progress Notes (Signed)
 COVID Vaccine received:  []  No [x]  Yes Date of any COVID positive Test in last 90 days:  PCP - Oliver Barre, MD  Cardiologist -  ID- Jeanine Luz, FNP, Darcus Pester, MD   Chest x-ray - 10-22-2020  2v Epic EKG -  04-21-2021  Epic Stress Test -  ECHO -  Cardiac Cath -   PCR screen: []  Ordered & Completed []   No Order but Needs PROFEND     [x]   N/A for this surgery  Surgery Plan:  [x]  Ambulatory   []  Outpatient in bed  []  Admit Anesthesia:    [x]  General  []  Spinal  []   Choice []   MAC  Pacemaker / ICD device [x]  No []  Yes   Spinal Cord Stimulator:[x]  No []  Yes       History of Sleep Apnea? []  No [x]  Yes   CPAP used?- []  No []  Yes    Does the patient monitor blood sugar?   [x]  N/A   []  No []  Yes  Patient has: [x]  NO Hx DM   []  Pre-DM   []  DM1  []   DM2 Last A1c was:  5.9 on  04-08-2023      Blood Thinner / Instructions:  none Aspirin Instructions:  none  ERAS Protocol Ordered: [x]  No  []  Yes Patient is to be NPO after: midnight prior  Dental hx: []  Dentures:  []  N/A      []  Bridge or Partial:                   []  Loose or Damaged teeth:   Comments:   Activity level: Patient is able / unable to climb a flight of stairs without difficulty; []  No CP  []  No SOB, but would have ___   Patient can / can not perform ADLs without assistance.   Anesthesia review: HTN, GERD, HIV, Hx Monkey Pox, Hx CVA, MDD, Panic attacks. ETOH & THC, OSA-  Patient denies shortness of breath, fever, cough and chest pain at PAT appointment.  Patient verbalized understanding and agreement to the Pre-Surgical Instructions that were given to them at this PAT appointment. Patient was also educated of the need to review these PAT instructions again prior to his surgery.I reviewed the appropriate phone numbers to call if they have any and questions or concerns.

## 2023-06-21 ENCOUNTER — Other Ambulatory Visit: Payer: Self-pay

## 2023-06-21 ENCOUNTER — Encounter (HOSPITAL_COMMUNITY): Payer: Self-pay

## 2023-06-21 ENCOUNTER — Encounter (HOSPITAL_COMMUNITY)
Admission: RE | Admit: 2023-06-21 | Discharge: 2023-06-21 | Disposition: A | Discharge status: Home / Self Care | Payer: Medicare HMO | Source: Ambulatory Visit | Attending: Urology | Admitting: Urology

## 2023-06-21 VITALS — BP 136/96 | HR 72 | Temp 97.7°F | Resp 16 | Ht 69.0 in | Wt 157.0 lb

## 2023-06-21 DIAGNOSIS — Z01818 Encounter for other preprocedural examination: Secondary | ICD-10-CM | POA: Insufficient documentation

## 2023-06-21 DIAGNOSIS — R9431 Abnormal electrocardiogram [ECG] [EKG]: Secondary | ICD-10-CM | POA: Diagnosis not present

## 2023-06-21 DIAGNOSIS — Z01812 Encounter for preprocedural laboratory examination: Secondary | ICD-10-CM | POA: Diagnosis not present

## 2023-06-21 DIAGNOSIS — I10 Essential (primary) hypertension: Secondary | ICD-10-CM | POA: Insufficient documentation

## 2023-06-21 DIAGNOSIS — Z01810 Encounter for preprocedural cardiovascular examination: Secondary | ICD-10-CM | POA: Diagnosis not present

## 2023-06-21 DIAGNOSIS — Z79899 Other long term (current) drug therapy: Secondary | ICD-10-CM | POA: Insufficient documentation

## 2023-06-21 HISTORY — DX: Personal history of urinary calculi: Z87.442

## 2023-06-21 LAB — COMPREHENSIVE METABOLIC PANEL
ALT: 12 U/L (ref 0–44)
AST: 18 U/L (ref 15–41)
Albumin: 3.9 g/dL (ref 3.5–5.0)
Alkaline Phosphatase: 76 U/L (ref 38–126)
Anion gap: 9 (ref 5–15)
BUN: 19 mg/dL (ref 8–23)
CO2: 23 mmol/L (ref 22–32)
Calcium: 9.2 mg/dL (ref 8.9–10.3)
Chloride: 105 mmol/L (ref 98–111)
Creatinine, Ser: 1.2 mg/dL (ref 0.61–1.24)
GFR, Estimated: >60 mL/min (ref >60)
Glucose, Bld: 94 mg/dL (ref 70–99)
Potassium: 3.6 mmol/L (ref 3.5–5.1)
Sodium: 137 mmol/L (ref 135–145)
Total Bilirubin: 0.4 mg/dL (ref 0.0–1.2)
Total Protein: 7.5 g/dL (ref 6.5–8.1)

## 2023-06-21 LAB — CBC
HCT: 42.2 % (ref 39.0–52.0)
Hemoglobin: 13.4 g/dL (ref 13.0–17.0)
MCH: 28.8 pg (ref 26.0–34.0)
MCHC: 31.8 g/dL (ref 30.0–36.0)
MCV: 90.8 fL (ref 80.0–100.0)
Platelets: 193 10*3/uL (ref 150–400)
RBC: 4.65 MIL/uL (ref 4.22–5.81)
RDW: 12.6 % (ref 11.5–15.5)
WBC: 5.8 10*3/uL (ref 4.0–10.5)
nRBC: 0 % (ref 0.0–0.2)

## 2023-06-25 ENCOUNTER — Ambulatory Visit (HOSPITAL_COMMUNITY): Admitting: Anesthesiology

## 2023-06-25 ENCOUNTER — Other Ambulatory Visit: Payer: Self-pay

## 2023-06-25 ENCOUNTER — Ambulatory Visit (HOSPITAL_COMMUNITY)
Admission: RE | Admit: 2023-06-25 | Discharge: 2023-06-25 | Disposition: A | Discharge status: Home / Self Care | Payer: Medicare HMO | Attending: Urology | Admitting: Urology

## 2023-06-25 ENCOUNTER — Encounter (HOSPITAL_COMMUNITY)
Admission: RE | Disposition: A | Discharge status: Home / Self Care | Payer: Self-pay | Source: Home / Self Care | Attending: Urology

## 2023-06-25 ENCOUNTER — Ambulatory Visit (HOSPITAL_BASED_OUTPATIENT_CLINIC_OR_DEPARTMENT_OTHER): Admitting: Anesthesiology

## 2023-06-25 ENCOUNTER — Encounter (HOSPITAL_COMMUNITY): Payer: Self-pay | Admitting: Urology

## 2023-06-25 DIAGNOSIS — Z87891 Personal history of nicotine dependence: Secondary | ICD-10-CM | POA: Insufficient documentation

## 2023-06-25 DIAGNOSIS — R3129 Other microscopic hematuria: Secondary | ICD-10-CM | POA: Diagnosis not present

## 2023-06-25 DIAGNOSIS — Z91041 Radiographic dye allergy status: Secondary | ICD-10-CM | POA: Diagnosis not present

## 2023-06-25 DIAGNOSIS — I10 Essential (primary) hypertension: Secondary | ICD-10-CM | POA: Insufficient documentation

## 2023-06-25 DIAGNOSIS — Z21 Asymptomatic human immunodeficiency virus [HIV] infection status: Secondary | ICD-10-CM | POA: Insufficient documentation

## 2023-06-25 DIAGNOSIS — N433 Hydrocele, unspecified: Secondary | ICD-10-CM | POA: Insufficient documentation

## 2023-06-25 DIAGNOSIS — Z79899 Other long term (current) drug therapy: Secondary | ICD-10-CM | POA: Insufficient documentation

## 2023-06-25 DIAGNOSIS — Z8042 Family history of malignant neoplasm of prostate: Secondary | ICD-10-CM | POA: Insufficient documentation

## 2023-06-25 HISTORY — PX: HYDROCELE EXCISION: SHX482

## 2023-06-25 HISTORY — PX: CYSTOSCOPY: SHX5120

## 2023-06-25 SURGERY — HYDROCELECTOMY
Anesthesia: General | Laterality: Right

## 2023-06-25 MED ORDER — CHLORHEXIDINE GLUCONATE 0.12 % MT SOLN
15.0000 mL | Freq: Once | OROMUCOSAL | Status: AC
  Administered 2023-06-25: 15 mL via OROMUCOSAL

## 2023-06-25 MED ORDER — ONDANSETRON HCL 4 MG/2ML IJ SOLN
INTRAMUSCULAR | Status: AC
  Filled 2023-06-25: qty 2

## 2023-06-25 MED ORDER — STERILE WATER FOR IRRIGATION IR SOLN
Status: DC | PRN
  Administered 2023-06-25: 3000 mL

## 2023-06-25 MED ORDER — EPHEDRINE SULFATE-NACL 50-0.9 MG/10ML-% IV SOSY
PREFILLED_SYRINGE | INTRAVENOUS | Status: DC | PRN
  Administered 2023-06-25: 10 mg via INTRAVENOUS
  Administered 2023-06-25: 15 mg via INTRAVENOUS

## 2023-06-25 MED ORDER — ONDANSETRON HCL 4 MG/2ML IJ SOLN
INTRAMUSCULAR | Status: DC | PRN
  Administered 2023-06-25 (×2): 4 mg via INTRAVENOUS

## 2023-06-25 MED ORDER — FENTANYL CITRATE PF 50 MCG/ML IJ SOSY: 25.0000 ug | PREFILLED_SYRINGE | INTRAMUSCULAR | Status: DC | PRN

## 2023-06-25 MED ORDER — BUPIVACAINE HCL (PF) 0.25 % IJ SOLN
INTRAMUSCULAR | Status: DC | PRN
  Administered 2023-06-25: 20 mL

## 2023-06-25 MED ORDER — DEXAMETHASONE SODIUM PHOSPHATE 10 MG/ML IJ SOLN
INTRAMUSCULAR | Status: DC | PRN
  Administered 2023-06-25: 10 mg via INTRAVENOUS

## 2023-06-25 MED ORDER — AMISULPRIDE (ANTIEMETIC) 5 MG/2ML IV SOLN: 10.0000 mg | Freq: Once | INTRAVENOUS | Status: DC | PRN

## 2023-06-25 MED ORDER — LIDOCAINE HCL (PF) 2 % IJ SOLN
INTRAMUSCULAR | Status: AC
  Filled 2023-06-25: qty 5

## 2023-06-25 MED ORDER — MIDAZOLAM HCL 2 MG/2ML IJ SOLN
INTRAMUSCULAR | Status: AC
  Filled 2023-06-25: qty 2

## 2023-06-25 MED ORDER — PROPOFOL 10 MG/ML IV BOLUS
INTRAVENOUS | Status: AC
  Filled 2023-06-25: qty 20

## 2023-06-25 MED ORDER — ACETAMINOPHEN 500 MG PO TABS
1000.0000 mg | ORAL_TABLET | Freq: Once | ORAL | Status: AC
  Administered 2023-06-25: 1000 mg via ORAL
  Filled 2023-06-25: qty 2

## 2023-06-25 MED ORDER — PHENYLEPHRINE 80 MCG/ML (10ML) SYRINGE FOR IV PUSH (FOR BLOOD PRESSURE SUPPORT)
PREFILLED_SYRINGE | INTRAVENOUS | Status: AC
  Filled 2023-06-25: qty 10

## 2023-06-25 MED ORDER — DEXAMETHASONE SODIUM PHOSPHATE 10 MG/ML IJ SOLN
INTRAMUSCULAR | Status: AC
  Filled 2023-06-25: qty 1

## 2023-06-25 MED ORDER — LACTATED RINGERS IV SOLN: INTRAVENOUS | Status: DC

## 2023-06-25 MED ORDER — CEFAZOLIN SODIUM-DEXTROSE 2-4 GM/100ML-% IV SOLN
2.0000 g | INTRAVENOUS | Status: AC
  Administered 2023-06-25: 2 g via INTRAVENOUS
  Filled 2023-06-25: qty 100

## 2023-06-25 MED ORDER — FENTANYL CITRATE (PF) 100 MCG/2ML IJ SOLN
INTRAMUSCULAR | Status: AC
  Filled 2023-06-25: qty 2

## 2023-06-25 MED ORDER — BUPIVACAINE HCL (PF) 0.25 % IJ SOLN
INTRAMUSCULAR | Status: AC
  Filled 2023-06-25: qty 30

## 2023-06-25 MED ORDER — MIDAZOLAM HCL 5 MG/5ML IJ SOLN
INTRAMUSCULAR | Status: DC | PRN
  Administered 2023-06-25: 2 mg via INTRAVENOUS

## 2023-06-25 MED ORDER — PROPOFOL 10 MG/ML IV BOLUS
INTRAVENOUS | Status: DC | PRN
  Administered 2023-06-25: 30 mg via INTRAVENOUS
  Administered 2023-06-25: 120 mg via INTRAVENOUS

## 2023-06-25 MED ORDER — ALBUMIN HUMAN 5 % IV SOLN: INTRAVENOUS | Status: DC | PRN

## 2023-06-25 MED ORDER — LIDOCAINE HCL (CARDIAC) PF 100 MG/5ML IV SOSY
PREFILLED_SYRINGE | INTRAVENOUS | Status: DC | PRN
  Administered 2023-06-25: 80 mg via INTRAVENOUS

## 2023-06-25 MED ORDER — FENTANYL CITRATE (PF) 100 MCG/2ML IJ SOLN
INTRAMUSCULAR | Status: DC | PRN
  Administered 2023-06-25: 50 ug via INTRAVENOUS

## 2023-06-25 MED ORDER — ALBUMIN HUMAN 5 % IV SOLN
INTRAVENOUS | Status: AC
  Filled 2023-06-25: qty 250

## 2023-06-25 MED ORDER — ORAL CARE MOUTH RINSE: 15.0000 mL | Freq: Once | OROMUCOSAL | Status: AC

## 2023-06-25 MED ORDER — PHENYLEPHRINE 80 MCG/ML (10ML) SYRINGE FOR IV PUSH (FOR BLOOD PRESSURE SUPPORT)
PREFILLED_SYRINGE | INTRAVENOUS | Status: DC | PRN
  Administered 2023-06-25 (×5): 80 ug via INTRAVENOUS

## 2023-06-25 MED ORDER — EPHEDRINE 5 MG/ML INJ
INTRAVENOUS | Status: AC
  Filled 2023-06-25: qty 5

## 2023-06-25 SURGICAL SUPPLY — 33 items
BAG URO CATCHER STRL LF (MISCELLANEOUS) ×2 IMPLANT
BLADE CLIPPER SENSICLIP SURGIC (BLADE) ×2 IMPLANT
BNDG GAUZE DERMACEA FLUFF 4 (GAUZE/BANDAGES/DRESSINGS) ×2 IMPLANT
CATH ROBINSON RED A/P 16FR (CATHETERS) IMPLANT
CATH URETL OPEN END 6FR 70 (CATHETERS) ×2 IMPLANT
CLOTH BEACON ORANGE TIMEOUT ST (SAFETY) ×2 IMPLANT
DERMABOND ADVANCED .7 DNX12 (GAUZE/BANDAGES/DRESSINGS) ×2 IMPLANT
DRAIN PENROSE 0.25X18 (DRAIN) ×2 IMPLANT
DRAPE LAPAROTOMY T 98X78 PEDS (DRAPES) ×2 IMPLANT
ELECT REM PT RETURN 15FT ADLT (MISCELLANEOUS) ×2 IMPLANT
GAUZE 4X4 16PLY ~~LOC~~+RFID DBL (SPONGE) IMPLANT
GLOVE BIO SURGEON STRL SZ 6.5 (GLOVE) ×2 IMPLANT
GOWN STRL REUS W/ TWL LRG LVL3 (GOWN DISPOSABLE) ×2 IMPLANT
GUIDEWIRE STR DUAL SENSOR (WIRE) ×2 IMPLANT
KIT BASIN OR (CUSTOM PROCEDURE TRAY) ×2 IMPLANT
KIT TURNOVER KIT A (KITS) IMPLANT
MANIFOLD NEPTUNE II (INSTRUMENTS) ×2 IMPLANT
NDL HYPO 25X1 1.5 SAFETY (NEEDLE) ×2 IMPLANT
NEEDLE HYPO 25X1 1.5 SAFETY (NEEDLE) ×2 IMPLANT
PACK CYSTO (CUSTOM PROCEDURE TRAY) ×2 IMPLANT
PACK GENERAL/GYN (CUSTOM PROCEDURE TRAY) ×2 IMPLANT
PENCIL SMOKE EVACUATOR (MISCELLANEOUS) IMPLANT
SUPPORTER AHLETIC TETRA LG (SOFTGOODS) IMPLANT
SUT CHROMIC 3 0 SH 27 (SUTURE) ×2 IMPLANT
SUT ETHILON 4 0 PS 2 18 (SUTURE) IMPLANT
SUT MNCRL AB 4-0 PS2 18 (SUTURE) IMPLANT
SUT VIC AB 2-0 SH 27XBRD (SUTURE) IMPLANT
SUT VIC AB 3-0 SH 27X BRD (SUTURE) IMPLANT
SYR CONTROL 10ML LL (SYRINGE) ×2 IMPLANT
TOWEL OR 17X26 10 PK STRL BLUE (TOWEL DISPOSABLE) ×2 IMPLANT
TUBING CONNECTING 10 (TUBING) ×2 IMPLANT
TUBING UROLOGY SET (TUBING) IMPLANT
YANKAUER SUCT BULB TIP 10FT TU (MISCELLANEOUS) IMPLANT

## 2023-06-25 NOTE — Anesthesia Preprocedure Evaluation (Signed)
 Anesthesia Evaluation  Patient identified by MRN, date of birth, ID band Patient awake    Reviewed: Allergy & Precautions, NPO status , Patient's Chart, lab work & pertinent test results  Airway Mallampati: II  TM Distance: >3 FB Neck ROM: Full    Dental   Pulmonary former smoker   breath sounds clear to auscultation       Cardiovascular hypertension, Pt. on medications  Rhythm:Regular Rate:Normal     Neuro/Psych  Neuromuscular disease    GI/Hepatic Neg liver ROS,GERD  ,,  Endo/Other  negative endocrine ROS    Renal/GU Renal disease     Musculoskeletal  (+) Arthritis ,    Abdominal   Peds  Hematology  (+) HIV  Anesthesia Other Findings   Reproductive/Obstetrics                             Anesthesia Physical Anesthesia Plan  ASA: 2  Anesthesia Plan: General   Post-op Pain Management: Tylenol PO (pre-op)*   Induction: Intravenous  PONV Risk Score and Plan: 2 and Dexamethasone, Ondansetron, Midazolam and Treatment may vary due to age or medical condition  Airway Management Planned: LMA  Additional Equipment: None  Intra-op Plan:   Post-operative Plan: Extubation in OR  Informed Consent: I have reviewed the patients History and Physical, chart, labs and discussed the procedure including the risks, benefits and alternatives for the proposed anesthesia with the patient or authorized representative who has indicated his/her understanding and acceptance.     Dental advisory given  Plan Discussed with:   Anesthesia Plan Comments:        Anesthesia Quick Evaluation

## 2023-06-25 NOTE — Anesthesia Postprocedure Evaluation (Signed)
 Anesthesia Post Note  Patient: Socorro A McKoy Jr  Procedure(s) Performed: RIGHT HYDROCELECTOMY ADULT (Right) FLEXIBLE CYSTOSCOPY     Patient location during evaluation: PACU Anesthesia Type: General Level of consciousness: awake and alert Pain management: pain level controlled Vital Signs Assessment: post-procedure vital signs reviewed and stable Respiratory status: spontaneous breathing, nonlabored ventilation, respiratory function stable and patient connected to nasal cannula oxygen Cardiovascular status: blood pressure returned to baseline and stable Postop Assessment: no apparent nausea or vomiting Anesthetic complications: no  No notable events documented.  Last Vitals:  Vitals:   06/25/23 0915 06/25/23 0930  BP: (!) 146/88 (!) 141/86  Pulse: 66 61  Resp: 12 13  Temp:  36.4 C  SpO2: 97% 98%    Last Pain:  Vitals:   06/25/23 0930  TempSrc:   PainSc: 0-No pain                 Shelton Silvas

## 2023-06-25 NOTE — Op Note (Signed)
 PATIENT:  Mason Morales  PRE-OPERATIVE DIAGNOSIS: Microscopic hematuria Right hydrocele  POST-OPERATIVE DIAGNOSIS:  Same  PROCEDURE:  Procedure(s): Flexible diagnostic cystoscopy, right hydrocelectomy  SURGEON:  Kasandra Knudsen, MD  ASSISTANT: Sharion Dove, MD  INDICATION:   ANESTHESIA:  General  EBL:  Minimal  DRAINS: None  LOCAL MEDICATIONS USED:  None  SPECIMEN:  None  DISPOSITION OF SPECIMEN:  N/A  Findings: 1.  Normal anterior urethra 2.  Obstructing lateral lobes, no signifcant intravesical median lobe 3.  Bilateral orthotopic ureteral orifices 4.  Normal bladder mucosa 5.  Healthy appearing right testicle  Description of procedure: After informed consent the patient was brought to the major OR, placed on the table and administered general anesthesia. His genitalia was then sterilely prepped and draped. An official timeout was then performed.  A flexible cystoscope was placed in the urethral meatus and advanced into the bladder under direct visualization.  The bladder was systematically examined.  There were no bladder masses or abnormalities seen on the bladder mucosa.  Bilateral ureteral orifices were seen effluxing clear urine.  The cystoscope was then removed.  A 16 French straight catheter was then inserted to drain the bladder.  A scrotal incision was then made over the right hemiscrotum and carried down over the right hydrocele. The tissue over the hydrocele was cleared using a combination of sharp and blunt technique. The hydrocele was then opened, drained of clear amber fluid and delivered through the incision. The excess hydrocele sac tissue was excised with the Bovie electrocautery and then I cauterized the edges. I then reapproximated the edges posteriorly behind the epididymis with a running 3-0 vicryl suture. The appendix testis was removed with the Bovie.  His testicle was then replaced in the normal anatomic position and his right hemiscrotum.   Hemostasis was achieved with Bovie electrocautery.  The hemiscrotum was irrigated.  I then closed the deep scrotal tissue with running 3-0 chromic suture in a locking fashion. I injected quarter percent Marcaine in the subcutaneous tissue and closed the skin with running 3-0 chromic. Dermabond, a sterile gauze dressing, fluff Kerlix and a scrotal support were applied. The patient tolerated the procedure well no intraoperative complications. Needle sponge and instrument counts were correct at the end of the operation.   PLAN OF CARE: Discharge to home after PACU  PATIENT DISPOSITION:  PACU - hemodynamically stable.

## 2023-06-25 NOTE — H&P (Signed)
 CC/HPI: cc: hydrocele   06/07/23: 68 year old man comes in with right scrotal swelling. He had a CT scan in December that showed a chronic right hydrocele. It bothers patient intermittently. He also has LUTS including frequency, urgency and nocturia. He does drink coffee and tea late at night. His dad has a history of prostate cancer. He had a PSA checked in December 2024 that was 1.1. He also has microscopic hematuria on urinalysis today.     ALLERGIES: Gadolinium Derivatives Isovue Sulfa Antibiotics  Sulfur Dioxide    MEDICATIONS: Amlodipine Besylate 10 mg tablet  Atorvastatin Calcium 40 mg tablet  Cabenuva 600 mg/3 ml-900 mg/3 ml suspension,extended release vial  Citalopram Hbr 20 mg tablet  Lorazepam 1 mg tablet  Tramadol Hcl 50 mg tablet  Vitamin B12  Vitamin D3     GU PSH: None   NON-GU PSH: Dental Surgery Procedure     GU PMH: None     PMH Notes: Kidney stones  Panic attacks    NON-GU PMH: Anxiety Depression GERD HIV Hypercholesterolemia Hypertension Sleep Apnea    FAMILY HISTORY: Congestive Heart Failure - Mother nephrolithiasis - Mother, Father Prostate Cancer - Father   SOCIAL HISTORY: Marital Status: Single Preferred Language: English; Ethnicity: Not Hispanic Or Latino; Race: Black or African American Current Smoking Status: Patient does not smoke anymore. Has not smoked since 05/24/2008. Smoked for 35 years. Smoked 1 pack per day.   Tobacco Use Assessment Completed: Used Tobacco in last 30 days? Has never drank.  Drinks 2 caffeinated drinks per day. Patient's occupation is/was Retired.    REVIEW OF SYSTEMS:    GU Review Male:   Patient reports frequent urination, hard to postpone urination, get up at night to urinate, stream starts and stops, trouble starting your stream, and erection problems. Patient denies burning/ pain with urination, leakage of urine, have to strain to urinate , and penile pain.  Gastrointestinal (Upper):   Patient reports  indigestion/ heartburn. Patient denies nausea and vomiting.  Gastrointestinal (Lower):   Patient denies diarrhea and constipation.  Constitutional:   Patient reports fatigue. Patient denies fever, night sweats, and weight loss.  Skin:   Patient denies skin rash/ lesion and itching.  Eyes:   Patient denies blurred vision and double vision.  Ears/ Nose/ Throat:   Patient reports sinus problems. Patient denies sore throat.  Hematologic/Lymphatic:   Patient denies swollen glands and easy bruising.  Cardiovascular:   Patient denies chest pains and leg swelling.  Respiratory:   Patient denies cough and shortness of breath.  Endocrine:   Patient denies excessive thirst.  Musculoskeletal:   Patient reports back pain. Patient denies joint pain.  Neurological:   Patient denies headaches and dizziness.  Psychologic:   Patient reports depression and anxiety.    VITAL SIGNS:      06/07/2023 08:49 AM  Weight 170 lb / 77.11 kg  Height 69 in / 175.26 cm  BP 155/86 mmHg  Pulse 74 /min  Temperature 97.5 F / 36.3 C  BMI 25.1 kg/m   GU PHYSICAL EXAMINATION:      Notes: circ phallus, bilateral descended testicles, right moderate hydrocele   MULTI-SYSTEM PHYSICAL EXAMINATION:    Constitutional: Well-nourished. No physical deformities. Normally developed. Good grooming.  Neck: Neck symmetrical, not swollen. Normal tracheal position.  Respiratory: No labored breathing, no use of accessory muscles.   Skin: No paleness, no jaundice, no cyanosis. No lesion, no ulcer, no rash.  Neurologic / Psychiatric: Oriented to time, oriented to place, oriented to  person. No depression, no anxiety, no agitation.  Eyes: Normal conjunctivae. Normal eyelids.  Ears, Nose, Mouth, and Throat: Left ear no scars, no lesions, no masses. Right ear no scars, no lesions, no masses. Nose no scars, no lesions, no masses. Normal hearing. Normal lips.  Musculoskeletal: Normal gait and station of head and neck.     Complexity of Data:   Records Review:   Previous Patient Records, POC Tool  Urine Test Review:   Urinalysis  X-Ray Review: Scrotal Ultrasound: Reviewed Films. Discussed With Patient. Moderate right hydrocele. Subclinical left hydrocele. C.T. Abdomen/Pelvis: Reviewed Films. Reviewed Report. Discussed With Patient. IMPRESSION:  1. No acute findings or explanation for the patient's symptoms.  2. Punctate nonobstructing bilateral renal calculi. No evidence of  ureteral calculus, hydronephrosis or perinephric soft tissue  stranding.  3. Chronic right scrotal hydrocele.  4. Aortic Atherosclerosis (ICD10-I70.0).    Electronically Signed  By: Carey Bullocks M.D.  On: 03/01/2023 14:26    01/05/04  PSA  Total PSA 0.51     PROCEDURES:         Scrotal Ultrasound - 13244  Right Testicle: Length: 4.34 cm  Height: 2.50 cm  Width: 3.22 cm  Left Testicle: Length: 4.49 cm  Height: 2.37 cm  Width: 3.20 cm  Scrotal Wall:  WNL  Left Testis/Epididymis:  Lt hydrocele; small left epi head cyst 0.3x 0.2x 0.3 cm  Right Testis/Epididymis:  Rt hydrocele      Patient confirmed No Neulasta OnPro Device.           Urinalysis w/Scope Dipstick Dipstick Cont'd Micro  Color: Yellow Bilirubin: Neg mg/dL WBC/hpf: 0 - 5/hpf  Appearance: Clear Ketones: Trace mg/dL RBC/hpf: 3 - 01/UUV  Specific Gravity: 1.025 Blood: 1+ ery/uL Bacteria: Few (10-25/hpf)  pH: 6.0 Protein: Trace mg/dL Cystals: NS (Not Seen)  Glucose: Neg mg/dL Urobilinogen: 0.2 mg/dL Casts: NS (Not Seen)    Nitrites: Neg Trichomonas: Not Present    Leukocyte Esterase: Neg leu/uL Mucous: Present      Epithelial Cells: 0 - 5/hpf      Yeast: NS (Not Seen)      Sperm: Not Present    ASSESSMENT:      ICD-10 Details  1 GU:   Hydrocele - N43.0 Chronic, Stable  2   Microscopic hematuria - R31.21 Undiagnosed New Problem   PLAN:           Orders X-Rays: Scrotal Ultrasound          Schedule         Document Letter(s):  Created for Patient: Clinical  Summary         Notes:   1. Hydrocele:  -Physical exam consistent with right hydrocele and this was confirmed on scrotal ultrasound today  -Risks and benefits of right hydrocelectomy discussed with the patient in detail including but not limited to pain, bleeding, infection, recurrence, damage to surrounding structures, need for additional treatment, hematoma  -Schedule next available surgery date   2. Microscopic hematuria:  -Patient has contrast allergy and had recent noncontrast CT of the abdomen and pelvis in December.  -Will complete hematuria evaluation with cystoscopy at same time as hydrocelectomy

## 2023-06-25 NOTE — Anesthesia Procedure Notes (Signed)
 Procedure Name: LMA Insertion Date/Time: 06/25/2023 7:43 AM  Performed by: Maurene Capes, CRNAPre-anesthesia Checklist: Patient identified, Emergency Drugs available, Suction available and Patient being monitored Patient Re-evaluated:Patient Re-evaluated prior to induction Oxygen Delivery Method: Circle System Utilized Preoxygenation: Pre-oxygenation with 100% oxygen Induction Type: IV induction Ventilation: Mask ventilation without difficulty LMA: LMA inserted LMA Size: 4.0 Number of attempts: 1 Airway Equipment and Method: Bite block Placement Confirmation: positive ETCO2 Tube secured with: Tape Dental Injury: Teeth and Oropharynx as per pre-operative assessment

## 2023-06-25 NOTE — Discharge Instructions (Signed)
Scrotal surgery postoperative instructions  Wound:  In most cases your incision will have absorbable sutures that will dissolve within the first 10-20 days. Some will fall out even earlier. Expect some redness as the sutures dissolved but this should occur only around the sutures. If there is generalized redness, especially with increasing pain or swelling, let us know. The scrotum will very likely get "black and blue" as the blood in the tissues spread. Sometimes the whole scrotum will turn colors. The black and blue is followed by a yellow and brown color. In time, all the discoloration will go away. In some cases some firm swelling in the area of the testicle may persist for up to 4-6 weeks after the surgery and is considered normal in most cases.  Diet:  You may return to your normal diet within 24 hours following your surgery. You may note some mild nausea and possibly vomiting the first 6-8 hours following surgery. This is usually due to the side effects of anesthesia, and will disappear quite soon. I would suggest clear liquids and a very light meal the first evening following your surgery.  Activity:  Your physical activity should be restricted the first 48 hours. During that time you should remain relatively inactive, moving about only when necessary. During the first 7-10 days following surgery he should avoid lifting any heavy objects (anything greater than 15 pounds), and avoid strenuous exercise. If you work, ask us specifically about your restrictions, both for work and home. We will write a note to your employer if needed.  You should plan to wear a tight pair of jockey shorts or an athletic supporter for the first 4-5 days, even to sleep. This will keep the scrotum immobilized to some degree and keep the swelling down.  Ice packs should be placed on and off over the scrotum for the first 48 hours. Frozen peas or corn in a ZipLock bag can be frozen, used and re-frozen. Fifteen minutes  on and 15 minutes off is a reasonable schedule. The ice is a good pain reliever and keeps the swelling down.  Hygiene:  You may shower 48 hours after your surgery. Tub bathing should be restricted until the seventh day.          Medication:  You will be sent home with some type of pain medication. In many cases you will be sent home with a narcotic pain pill (hydrococone or oxycodone). If the pain is not too bad, you may take either Tylenol (acetaminophen) or Advil (ibuprofen) which contain no narcotic agents, and might be tolerated a little better, with fewer side effects. If the pain medication you are sent home with does not control the pain, you will have to let us know. Some narcotic pain medications cannot be given or refilled by a phone call to a pharmacy.  Problems you should report to us:   Fever of 101.0 degrees Fahrenheit or greater.  Moderate or severe swelling under the skin incision or involving the scrotum.  Drug reaction such as hives, a rash, nausea or vomiting.  

## 2023-06-25 NOTE — Transfer of Care (Signed)
 Immediate Anesthesia Transfer of Care Note  Patient: Mason Morales  Procedure(s) Performed: RIGHT HYDROCELECTOMY ADULT (Right) FLEXIBLE CYSTOSCOPY  Patient Location: PACU  Anesthesia Type:General  Level of Consciousness: sedated  Airway & Oxygen Therapy: Patient Spontanous Breathing and Patient connected to face mask oxygen  Post-op Assessment: Report given to RN and Post -op Vital signs reviewed and stable  Post vital signs: Reviewed and stable  Last Vitals:  Vitals Value Taken Time  BP 130/82 06/25/23 0841  Temp    Pulse 64 06/25/23 0844  Resp 14 06/25/23 0844  SpO2 100 % 06/25/23 0844  Vitals shown include unfiled device data.  Last Pain:  Vitals:   06/25/23 0536  TempSrc:   PainSc: 0-No pain         Complications: No notable events documented.

## 2023-06-26 ENCOUNTER — Encounter (HOSPITAL_COMMUNITY): Payer: Self-pay | Admitting: Urology

## 2023-07-09 DIAGNOSIS — N430 Encysted hydrocele: Secondary | ICD-10-CM | POA: Diagnosis not present

## 2023-07-31 ENCOUNTER — Telehealth: Payer: Self-pay

## 2023-07-31 ENCOUNTER — Ambulatory Visit: Payer: Self-pay | Admitting: Internal Medicine

## 2023-07-31 NOTE — Telephone Encounter (Signed)
 [facility] Patient Advocate Encounter  Patient's medications CABENUVA have been couriered to [facility] from [employer] [facility] [employer] and will be administered at the patients appointment on 08/06/23.  Kae Heller, CPhT Specialty Pharmacy Patient [facility] Phone: [phone] Fax:  [phone]

## 2023-08-06 ENCOUNTER — Other Ambulatory Visit: Payer: Self-pay

## 2023-08-06 ENCOUNTER — Encounter: Payer: Self-pay | Admitting: Internal Medicine

## 2023-08-06 ENCOUNTER — Ambulatory Visit: Payer: Medicare HMO | Admitting: Internal Medicine

## 2023-08-06 VITALS — BP 153/80 | HR 71 | Temp 97.4°F | Ht 69.0 in | Wt 155.0 lb

## 2023-08-06 DIAGNOSIS — B20 Human immunodeficiency virus [HIV] disease: Secondary | ICD-10-CM

## 2023-08-06 MED ORDER — CABOTEGRAVIR & RILPIVIRINE ER 600 & 900 MG/3ML IM SUER
1.0000 | Freq: Once | INTRAMUSCULAR | Status: AC
Start: 2023-08-06 — End: 2023-08-06
  Administered 2023-08-06: 1 via INTRAMUSCULAR

## 2023-08-06 NOTE — Addendum Note (Signed)
 Addended by: Edmund Gouge on: 08/06/2023 10:36 AM   Modules accepted: Orders

## 2023-08-06 NOTE — Patient Instructions (Signed)
 Please continue to see pharmacy every 2 months for cabenuva   Next blood tests due with me in 01/2024 we can do after visit    See me in 01/2024    F/u with your primary care provider for age-appropriate cancer screening and other medical care including endocrine issue

## 2023-08-06 NOTE — Progress Notes (Signed)
 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 male htn, gerd, kidney stones, previously on appretude study who acquired HIV while taking appretude, here for f/u hiv care  06/2021 id clinic visit He has been in the appretude study with Dr Zenaida Niece [place]/[facility] (hptn 084 study). He had transitioned to commercial appretude after the study  He was infected with monkey pox/treated in 12/2020  It appears he failed to get one injection of apretude in 03/2021. He presented to the ER 12/29 with flu like symptoms. He returned to [facility] 04/2021 with viral load 246 although hiv screen was negative. Rapid start initiated with 2nd viral load of 2610 the same month. His initial reflex genotype/integrase testing showed no resistance.  He saw dr Algis Liming 2/23 and had an archive genotype ordered  He has been on symtuza. No missed dose last 4 weeks. Tolerate it well.  Other  meds: Celexa Amlodipine Bentyl Mobic Asa Prednisone   He had syphilis infection hx. Rpr has been stable at 1:2. He was given early latent treatment 2 months prior to this visit (04/2021), and years ago also for early latent (never treated as complicated syphilis or late latent syphilis)   Patient is bi-sexual, but doesn't do receptive anal sex. 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, Human resources officer No substance use; occasional thc (used for anxiety and panic attack) Hobbies -- professional equestrian; enjoy dogs pet; tennis; skiing  Recent infectious disease serology: 05/09/2021 Rpr 1:2 -- has been at this level for the past several years dating back to 2018 Hep a Ab nr; hep b sAb reactive; hep b cAb reactive; hep b sAg NR; hep c Ab nr Hiv rna 2610; hiv antibody negative Hiv genotype -- no bictegravir, carbotegravir, eltegravir resistance; RT RAM r211a, without any phenotypic resistance; PR RAM e35d and v77i, without protease inhibitor resistance Quantiferon gold negative  06/07/2021 Hiv rna negative; cd4 966 Hiv ab negative Triple screen gc/chlamydia negative  09/28/21 id clinic f/u Sex 10 days ago broke condom-- that person has gonorrhea (texted him). No sx. Wants testing. No oral/anal receptive sex Doing well Missed a dose of symtuza the last several weeks No complaint  Wants to get back on cabenuva   01/09/22 id clinic f/u Genotype testing with hiv transmission while on cabenuva done --> no ISI resistance or rilpivirine resistance He had restarted cabenuva  09/28/2021; his last shot was about almost 6 weeks ago 11/02/21 virologic remains undetectable No complaint today  No recent sexual exposure   06/06/22 id clinic f/u Patient had missed his last q61month cabenuva injection; he last received it 03/06/2022 He has been taking symtuza in place of cabenuva. He  wants to get back to cabenuva  He is functionally cured for hep b He wants to continue cabenuva -- discuss lack of antihep b activity and he is ok. No clear risk for reactivation  Stressed out preparing for horse competition. Father dementia passed away 3 years ago Not suicidal or homicidal  Social - not sexually active (last encounter 1 month). No anal receptive sex. Retired Charity fundraiser. No current job  No health concern  No f/c, weight loss, cough, n/v/diarrhea, rash, joint pain    01/29/23 id clinic f/u Patient has kidney stones 4 episodes. 2 weeks ago has similar ache/pain that started in the lower right flank but then would travel down to the right groin/testicle. No fever chill. Some nausea. Takes naproxen. No blood in urine/urgency/frequency. He has been drinking lots of coffee  Doing well from hiv standpoint on cabenuva. Had to restart cabenuva due to insurance issue/lapse back in 04/2022  No other concern  Reviewed labs from 11/2022 std testing negative; serofast on syphilis rpr; hiv undetectable  Will see pcp for colon cancer/prostate cancer f/u    08/06/23 id clinic f/u Doing well on cabenuva; last shot 06/11/23; q26months protocol Hiv undetectable since just after treatment started 05/2021 No concern today Review medication list -- amlodipine, citalopram, tramadol, cabenuva, atorvastatin 40, vit d, b12 pill Had recent hydrocelectomy 06/2023      Review of Systems: ROS All other ROS negative      Past Medical History:  Diagnosis Date   Allergic rhinitis, cause unspecified 10/26/2010   Allergy    DDD (degenerative disc disease), cervical 10/26/2010   DDD (degenerative disc disease), lumbar 10/26/2010   Depression    Generalized headaches    GERD (gastroesophageal reflux disease)    History of kidney stones    HIV disease (HCC) 06/06/2021   HTN (hypertension) 10/26/2010   Hypertension    Kidney stones    Panic attack    Vaccine counseling 06/06/2021     Social History   Tobacco Use   Smoking status: Former    Current packs/day: 0.00    Types: Cigarettes    Quit date: 03/20/2014    Years since quitting: 9.3   Smokeless tobacco: Never  Vaping Use   Vaping status: Never Used  Substance Use Topics   Alcohol use: Yes    Alcohol/week: 6.0 standard drinks of alcohol    Types: 6 Cans of beer per week    Comment: occasionally   Drug use: Yes    Types: Marijuana    Comment: THC-daily    Family History  Problem Relation Age of Onset   Hyperlipidemia Other    Heart disease Other    Stroke Other    Hypertension Other    Diabetes Other    Cancer Other        prostate cancer   Diabetes Other    Alcohol abuse Other    Cancer Other        breast cancer   Stroke Other    Colon cancer Neg Hx    Esophageal cancer Neg Hx    Pancreatic cancer Neg Hx    Rectal cancer Neg Hx    Stomach cancer Neg Hx  Allergies  Allergen Reactions   Gadolinium Derivatives Hives   Sulfur Dioxide Nausea And Vomiting   Isovue [Iopamidol] Hives and Itching   Sulfa Antibiotics Nausea And Vomiting    OBJECTIVE: Vitals:   08/06/23 0949  BP: (!) 153/80  Pulse: 71  Temp: (!) 97.4 F (36.3 C)  TempSrc: Oral  SpO2: 99%  Weight: 155 lb (70.3 kg)  Height: 5\' 9"  (1.753 m)   Body mass index is 22.89 kg/m.   Physical Exam General/constitutional: no distress, pleasant HEENT: Normocephalic, PER, Conj Clear, EOMI, Oropharynx clear Neck supple CV: rrr no mrg Lungs: clear to auscultation, normal respiratory effort Abd: Soft, Nontender Ext: no edema Skin: No Rash Neuro: nonfocal MSK: no peripheral joint swelling/tenderness/warmth; back spines nontender           Lab: Lab Results  Component Value Date   WBC 5.8 06/21/2023   HGB 13.4 06/21/2023   HCT 42.2 06/21/2023   MCV 90.8 06/21/2023   PLT 193 06/21/2023   Last metabolic panel Lab Results  Component Value Date   GLUCOSE 94 06/21/2023   NA 137 06/21/2023   K 3.6  06/21/2023   CL 105 06/21/2023   CO2 23 06/21/2023   BUN 19 06/21/2023   CREATININE 1.20 06/21/2023   EGFR 60 06/06/2022   CALCIUM 9.2 06/21/2023   PHOS 2.1 (L) 08/20/2019   PROT 7.5 06/21/2023   ALBUMIN 3.9 06/21/2023   BILITOT 0.4 06/21/2023   ALKPHOS 76 06/21/2023   AST 18 06/21/2023   ALT 12 06/21/2023   ANIONGAP 9 06/21/2023     HIV: 05/2023      <20      /      11/2022      <20      /     1120 (37%) 12/2021       <20 10/2021       <20 09/2021       <20   Microbiology:  Serology:  Imaging:   Assessment/plan: Problem List Items Addressed This Visit     HIV disease (HCC) - Primary     #hiv #cad risk Appears to have an acute retroviral syndrome early 04/2021 after he missed dose of appretude 03/2021 (only 1 missed dose) His viral load was 2600 when a genotype was done and showed no rilpivirine mutation or cabotegravir mutation I am unclear if at this time his viral load undetectable an archived genotype would be helpful  He was started on symtuza since 05/2021 and is currently undetectable  He prefers injectable for treatment   It is unclear why he contracted hiv given the duration of appretude and no evidence cabutegravir resistance (albeit no archive genotype available)  If archive is not able to be done, could consider dovato/biktarvy or cabenuva as next medication given improved DDI profile over symtuza. Will discuss this with Dr Ernie Heal. Close monitoring would be required initially given he contracted hiv while on appretude to detect integrase strand inhibitor resistance  Archive monogram 2/15 was negative for integrase resistance  He has been on symtuza but wants go go back on injection  Back on cabenuva in June. Remains well controlled on it He is on q-68months injection   06/06/22 taking symtuza since he missed cabenuva in 04/2022; wants to get back. Prior functionally cured hep b  07/2023 doing well on cabenuva.   -discussed u=u -encourage  compliance -continue current HIV medication with cabenuva -labs 6 months on f/u; continue pharmacy visit for cabenuva q62months -continue  lipitor 40 mg    #hx syphilis Rpr serofast at 1:2 to 1:1 since 2018. 1980s treatment once as early latent Stable rpr titer 11/2022   #hcm -vaccination Shingrix -- rx given 01/09/22 Meningococcal -- due for for meningococcal booster S/p prevnar20 05/2021 Zoster -- rx given 2 shot series; patient to bring rx here and nursing can help administer (2nd shot 2-6 months from first shot) Tdap 2018 -hepatitis Prior hep b infection -- functionally cured 05/2021 hep c ab negative  -std screen  negative throat/urine gc/chlam 11/2022 Rpr serofast 1:1 on 11/2022 Not sexually active and wanted to defer std testing as of 08/06/2023 -annual tb screen  negative 05/2021 -cancer screening  Patient bisexual but doesn't practice receptive anal intercourse and wants to defer anal pap Colonoscopy negative 2021 (polyps noncancer) - due every 5 years for now Prostate psa screening previously negative - will see his pcp again this year -- advise to set this up   #flank/groin pain right side #hx stone Imaging showed right hydrocele -- ultimately has hydrocelectomy 06/25/23. Assymptomatic as of 08/06/23 -- happy.     Follow-up: Return in about 6 months (around 02/05/2024).  Jamesetta Mcbride, MD [facility] for Infectious Disease [facility] [phone] pager   [phone] cell 08/06/2023, 10:03 AM

## 2023-08-20 ENCOUNTER — Telehealth: Payer: Self-pay

## 2023-08-20 ENCOUNTER — Ambulatory Visit (INDEPENDENT_AMBULATORY_CARE_PROVIDER_SITE_OTHER)

## 2023-08-20 VITALS — Ht 69.0 in | Wt 155.0 lb

## 2023-08-20 DIAGNOSIS — Z0100 Encounter for examination of eyes and vision without abnormal findings: Secondary | ICD-10-CM

## 2023-08-20 DIAGNOSIS — Z0000 Encounter for general adult medical examination without abnormal findings: Secondary | ICD-10-CM

## 2023-08-20 NOTE — Telephone Encounter (Signed)
 Patient is requesting a recommendation to see opthalmology.

## 2023-08-20 NOTE — Progress Notes (Signed)
 Subjective:   Mason Morales is a 68 y.o. who presents for a Medicare Wellness preventive visit.  Visit Complete: Virtual I connected with  Mason Morales on 08/20/23 by a audio enabled telemedicine application and verified that I am speaking with the correct person using two identifiers.  Patient Location: Home  Provider Location: Home Office  I discussed the limitations of evaluation and management by telemedicine. The patient expressed understanding and agreed to proceed.  Vital Signs: Because this visit was a virtual/telehealth visit, some criteria may be missing or patient reported. Any vitals not documented were not able to be obtained and vitals that have been documented are patient reported.  VideoDeclined- This patient declined Librarian, academic. Therefore the visit was completed with audio only.  Persons Participating in Visit: Patient.  AWV Questionnaire: No: Patient Medicare AWV questionnaire was not completed prior to this visit.  Cardiac Risk Factors include: advanced age (>77men, >34 women);hypertension     Objective:    Today's Vitals   08/20/23 0910  Weight: 155 lb (70.3 kg)  Height: 5\' 9"  (1.753 m)   Body mass index is 22.89 kg/m.     08/20/2023    9:43 AM 06/21/2023   10:12 AM 12/05/2021    7:53 AM 10/22/2020    5:05 AM 12/26/2017   12:48 AM 09/01/2017    8:55 PM 07/27/2017    6:10 AM  Advanced Directives  Does Patient Have a Medical Advance Directive? No No No No No No No  Would patient like information on creating a medical advance directive?  No - Patient declined     No - Patient declined    Current Medications (verified) Outpatient Encounter Medications as of 08/20/2023  Medication Sig   amLODipine  (NORVASC ) 10 MG tablet Take 1 tablet (10 mg total) by mouth daily.   cabotegravir  & rilpivirine  ER (CABENUVA ) 600 & 900 MG/3ML injection Inject 1 kit into the muscle every 2 (two) months.   citalopram  (CELEXA ) 20 MG  tablet TAKE 1 TABLET(20 MG) BY MOUTH DAILY   Cyanocobalamin  (B-12 PO) Take 1 tablet by mouth daily.   VITAMIN D  PO Take 1 capsule by mouth daily.   atorvastatin  (LIPITOR) 40 MG tablet Take 1 tablet (40 mg total) by mouth daily.   cabotegravir  & rilpivirine  ER (CABENUVA ) 600 & 900 MG/3ML injection Inject 1 kit into the muscle every 2 (two) months. (Patient not taking: Reported on 06/20/2023)   traMADol  (ULTRAM ) 50 MG tablet Take 1 tablet (50 mg total) by mouth every 6 (six) hours as needed. (Patient not taking: Reported on 06/20/2023)   No facility-administered encounter medications on file as of 08/20/2023.    Allergies (verified) Gadolinium derivatives, Sulfur dioxide, Isovue  [iopamidol ], and Sulfa antibiotics   History: Past Medical History:  Diagnosis Date   Allergic rhinitis, cause unspecified 10/26/2010   Allergy    DDD (degenerative disc disease), cervical 10/26/2010   DDD (degenerative disc disease), lumbar 10/26/2010   Depression    Generalized headaches    GERD (gastroesophageal reflux disease)    History of kidney stones    HIV disease (HCC) 06/06/2021   HTN (hypertension) 10/26/2010   Hypertension    Kidney stones    Panic attack    Vaccine counseling 06/06/2021   Past Surgical History:  Procedure Laterality Date   CYSTOSCOPY N/A 06/25/2023   Procedure: FLEXIBLE CYSTOSCOPY;  Surgeon: Roxane Copp, MD;  Location: [facility];  Service: Urology;  Laterality: N/A;   HYDROCELE  EXCISION Right 06/25/2023   Procedure: RIGHT HYDROCELECTOMY ADULT;  Surgeon: Roxane Copp, MD;  Location: [facility];  Service: Urology;  Laterality: Right;  60 MINUTES   MOUTH SURGERY  at 68 yo after horse accident   NO PAST SURGERIES     SKIN GRAFT     and plastic surgery on mouth x 3   Family History  Problem Relation Age of Onset   Hyperlipidemia Other    Heart disease Other    Stroke Other    Hypertension Other    Diabetes Other    Cancer Other        prostate cancer   Diabetes Other     Alcohol abuse Other    Cancer Other        breast cancer   Stroke Other    Colon cancer Neg Hx    Esophageal cancer Neg Hx    Pancreatic cancer Neg Hx    Rectal cancer Neg Hx    Stomach cancer Neg Hx    Social History   Socioeconomic History   Marital status: Single    Spouse name: Not on file   Number of children: Not on file   Years of education: 14   Highest education level: Not on file  Occupational History   Occupation: RETIRED/Raw Engineer, civil (consulting): [employer]  Tobacco Use   Smoking status: Former    Current packs/day: 0.00    Types: Cigarettes    Quit date: 03/20/2014    Years since quitting: 9.4   Smokeless tobacco: Never  Vaping Use   Vaping status: Never Used  Substance and Sexual Activity   Alcohol use: Yes    Alcohol/week: 6.0 standard drinks of alcohol    Types: 6 Cans of beer per week    Comment: occasionally   Drug use: Yes    Types: Marijuana    Comment: THC-daily   Sexual activity: Yes    Comment: declined condoms  Other Topics Concern   Not on file  Social History Narrative   Lives alone/2025   Social Drivers of Health   Financial Resource Strain: Medium Risk (08/20/2023)   Overall Financial Resource Strain (CARDIA)    Difficulty of Paying Living Expenses: Somewhat hard  Food Insecurity: No Food Insecurity (08/20/2023)   Hunger Vital Sign    Worried About Running Out of Food in the Last Year: Never true    Ran Out of Food in the Last Year: Never true  Transportation Needs: No Transportation Needs (08/20/2023)   PRAPARE - Administrator, Civil Service (Medical): No    Lack of Transportation (Non-Medical): No  Physical Activity: Sufficiently Active (08/20/2023)   Exercise Vital Sign    Days of Exercise per Week: 7 days    Minutes of Exercise per Session: 60 min  Stress: Stress Concern Present (08/20/2023)   [employer] of Occupational Health - Occupational Stress Questionnaire    Feeling of Stress : Very much  Social  Connections: Moderately Integrated (08/20/2023)   Social Connection and Isolation Panel [NHANES]    Frequency of Communication with Friends and Family: More than three times a week    Frequency of Social Gatherings with Friends and Family: More than three times a week    Attends Religious Services: More than 4 times per year    Active Member of [employer] or Organizations: Yes    Attends Banker Meetings: 1 to 4 times per year    Marital Status:  Never married    Tobacco Counseling Counseling given: Not Answered    Clinical Intake:  Pre-visit preparation completed: Yes  Pain : No/denies pain     BMI - recorded: 22.89 Nutritional Status: BMI of 19-24  Normal Nutritional Risks: None Diabetes: No  Lab Results  Component Value Date   HGBA1C 5.9 04/08/2023     How often do you need to have someone help you when you read instructions, pamphlets, or other written materials from your doctor or pharmacy?: 1 - Never  Interpreter Needed?: No  Information entered by :: Adaliz Dobis, RMA   Activities of Daily Living     08/20/2023    9:11 AM 06/21/2023   10:15 AM  In your present state of health, do you have any difficulty performing the following activities:  Hearing? 0   Vision? 0   Difficulty concentrating or making decisions? 0   Walking or climbing stairs? 0   Dressing or bathing? 0   Doing errands, shopping? 0 0  Comment Friends drive him   Quarry manager and eating ? N   Using the Toilet? N   In the past six months, have you accidently leaked urine? N   Do you have problems with loss of bowel control? N   Managing your Medications? N   Managing your Finances? N   Housekeeping or managing your Housekeeping? N     Patient Care Team: Roslyn Coombe, MD as PCP - General (Internal Medicine) Jamesetta Mcbride, MD as Consulting Physician (Infectious Diseases)  Indicate any recent Medical Services you may have received from other than [facility] providers in the past year  (date may be approximate).     Assessment:   This is a routine wellness examination for Seena.  Hearing/Vision screen Hearing Screening - Comments:: Denies hearing difficulties   Vision Screening - Comments:: Wears eyeglasses   Goals Addressed   None    Depression Screen     08/20/2023    9:47 AM 08/06/2023    9:54 AM 04/08/2023    2:59 PM 08/29/2022    9:34 AM 01/09/2022   10:08 AM 09/28/2021   10:36 AM 06/28/2021   10:43 AM  PHQ 2/9 Scores  PHQ - 2 Score 0 3 0 0 0 1 0  PHQ- 9 Score 0 6         Fall Risk     08/20/2023    9:43 AM 08/06/2023    9:52 AM 04/08/2023    2:59 PM 08/29/2022    9:34 AM 01/09/2022   10:08 AM  Fall Risk   Falls in the past year? 0 0 0 0 0  Number falls in past yr: 0 0 0 0 0  Injury with Fall? 0 0 0 0 0  Risk for fall due to : No Fall Risks No Fall Risks No Fall Risks No Fall Risks No Fall Risks  Follow up Falls prevention discussed;Falls evaluation completed Falls evaluation completed Falls evaluation completed Falls evaluation completed Falls evaluation completed    MEDICARE RISK AT HOME:  Medicare Risk at Home Any stairs in or around the home?: No Home free of loose throw rugs in walkways, pet beds, electrical cords, etc?: Yes Adequate lighting in your home to reduce risk of falls?: Yes Life alert?: No Use of a cane, walker or w/c?: No Grab bars in the bathroom?: No Shower chair or bench in shower?: No Elevated toilet seat or a handicapped toilet?: No  TIMED UP AND GO:  Was the test performed?  No  Cognitive Function: Declined/Normal: No cognitive concerns noted by patient or family. Patient alert, oriented, able to answer questions appropriately and recall recent events. No signs of memory loss or confusion.        Immunizations Immunization History  Administered Date(s) Administered   Fluad Trivalent(High Dose 65+) 01/29/2023   Hepatitis A, Adult 06/11/2023   Influenza,inj,Quad PF,6+ Mos 12/14/2016, 04/02/2018, 06/07/2021,  03/06/2022   PFIZER Comirnaty(Gray Top)Covid-19 Tri-Sucrose Vaccine 11/10/2019, 12/30/2019   PNEUMOCOCCAL CONJUGATE-20 06/07/2021   Pfizer Covid-19 Vaccine Bivalent Booster 31yrs & up 06/07/2021   Pfizer(Comirnaty)Fall Seasonal Vaccine 12 years and older 03/06/2022, 01/29/2023   Tdap 12/14/2016    Screening Tests Health Maintenance  Topic Date Due   Medicare Annual Wellness (AWV)  Never done   Zoster Vaccines- Shingrix (1 of 2) Never done   INFLUENZA VACCINE  11/22/2023   Colonoscopy  04/02/2026   DTaP/Tdap/Td (2 - Td or Tdap) 12/15/2026   Pneumonia Vaccine 28+ Years old  Completed   Hepatitis C Screening  Completed   HIV Screening  Completed   HPV VACCINES  Aged Out   Meningococcal B Vaccine  Aged Out   COVID-19 Vaccine  Discontinued    Health Maintenance  Health Maintenance Due  Topic Date Due   Medicare Annual Wellness (AWV)  Never done   Zoster Vaccines- Shingrix (1 of 2) Never done   Health Maintenance Items Addressed: See Nurse Notes  Additional Screening:  Vision Screening: Recommended annual ophthalmology exams for early detection of glaucoma and other disorders of the eye.  Dental Screening: Recommended annual dental exams for proper oral hygiene  Community Resource Referral / Chronic Care Management: CRR required this visit?  No   CCM required this visit?  No     Plan:     I have personally reviewed and noted the following in the patient's chart:   Medical and social history Use of alcohol, tobacco or illicit drugs  Current medications and supplements including opioid prescriptions. Patient is not currently taking opioid prescriptions. Functional ability and status Nutritional status Physical activity Advanced directives List of other physicians Hospitalizations, surgeries, and ER visits in previous 12 months Vitals Screenings to include cognitive, depression, and falls Referrals and appointments  In addition, I have reviewed and discussed  with patient certain preventive protocols, quality metrics, and best practice recommendations. A written personalized care plan for preventive services as well as general preventive health recommendations were provided to patient.     Brigitt Mcclish L Kimetha Trulson, CMA   08/20/2023   After Visit Summary: (MyChart) Due to this being a telephonic visit, the after visit summary with patients personalized plan was offered to patient via MyChart   Notes: Please refer to Routing Comments.

## 2023-08-20 NOTE — Progress Notes (Signed)
 [facility] Ophthalmology would be good.   thanks

## 2023-08-20 NOTE — Patient Instructions (Addendum)
 Mr. Mason Morales , Thank you for taking time to come for your Medicare Wellness Visit. I appreciate your ongoing commitment to your health goals. Please review the following plan we discussed and let me know if I can assist you in the future.   Referrals/Orders/Follow-Ups/Clinician Recommendations: It was nice talking with you today.  You are due for a Shingles vaccine and can get that at any local pharmacy.  Each day, aim for 6 glasses of water , plenty of protein in your diet and try to get up and walk/ stretch every hour for 5-10 minutes at a time.    This is a list of the screening recommended for you and due dates:  Health Maintenance  Topic Date Due   Medicare Annual Wellness Visit  Never done   Zoster (Shingles) Vaccine (1 of 2) Never done   Flu Shot  11/22/2023   Colon Cancer Screening  04/02/2026   DTaP/Tdap/Td vaccine (2 - Td or Tdap) 12/15/2026   Pneumonia Vaccine  Completed   Hepatitis C Screening  Completed   HIV Screening  Completed   HPV Vaccine  Aged Out   Meningitis B Vaccine  Aged Out   COVID-19 Vaccine  Discontinued    Advanced directives: (Declined) Advance directive discussed with you today. Even though you declined this today, please call our office should you change your mind, and we can give you the proper paperwork for you to fill out.  Next Medicare Annual Wellness Visit scheduled for next year: Yes

## 2023-08-20 NOTE — Telephone Encounter (Signed)
 Ok for [employer].   I can refer if he wants

## 2023-08-23 NOTE — Addendum Note (Signed)
 Addended by: Harles Evetts L on: 08/23/2023 11:27 AM   Modules accepted: Orders

## 2023-09-03 NOTE — Progress Notes (Signed)
 The 10-year ASCVD risk score (Arnett DK, et al., 2019) is: 19.3%   Values used to calculate the score:     Age: 68 years     Sex: Male     Is Non-Hispanic African American: Yes     Diabetic: No     Tobacco smoker: No     Systolic Blood Pressure: 153 mmHg     Is BP treated: Yes     HDL Cholesterol: 56.6 mg/dL     Total Cholesterol: 141 mg/dL  Arlon Bergamo, BSN, RN

## 2023-09-25 ENCOUNTER — Telehealth: Payer: Self-pay

## 2023-09-25 NOTE — Telephone Encounter (Signed)
 [facility] Patient Advocate Encounter  Patient's medications CABENUVA  have been couriered to [facility] from [facility] [employer] and will be administered at the patients appointment on 10/02/23.  Verline Glow, CPhT Specialty Pharmacy Patient [facility] for Infectious Disease Phone: [phone] Fax:  [phone]

## 2023-10-01 NOTE — Progress Notes (Unsigned)
 HPI: Mason Morales is a 68 y.o. male who presents to the [facility] pharmacy clinic for Cabenuva  administration.  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   Chronic constipation 07/22/2019   Left lumbar radiculopathy 03/26/2017   Pelvic pain 08/24/2016   Left cervical radiculopathy 05/20/2015   Anxiety and depression 05/20/2015   GERD (gastroesophageal reflux disease) 10/26/2010   Allergic rhinitis 10/26/2010   HTN (hypertension) 10/26/2010   Kidney stones 10/26/2010   DDD (degenerative disc disease), cervical 10/26/2010   DDD (degenerative disc disease), lumbar 10/26/2010   Left shoulder pain 10/26/2010   Preventative health care 10/26/2010    Patient's Medications  New Prescriptions   No medications on file  Previous Medications   AMLODIPINE  (NORVASC ) 10 MG TABLET    Take 1 tablet (10 mg total) by mouth daily.   ATORVASTATIN  (LIPITOR) 40 MG TABLET    Take 1 tablet (40 mg total) by mouth daily.   CABOTEGRAVIR  & RILPIVIRINE  ER (CABENUVA ) 600 & 900 MG/3ML INJECTION    Inject 1 kit into the muscle every 2 (two) months.   CABOTEGRAVIR  & RILPIVIRINE  ER (CABENUVA ) 600 & 900 MG/3ML INJECTION    Inject 1 kit into the muscle every 2 (two) months.   CITALOPRAM  (CELEXA ) 20 MG TABLET    TAKE 1 TABLET(20 MG) BY MOUTH DAILY   CYANOCOBALAMIN  (B-12 PO)    Take 1 tablet by mouth daily.   VITAMIN D  PO    Take 1 capsule by mouth daily.  Modified Medications   No medications on file  Discontinued Medications   No medications on file    Allergies: Allergies  Allergen Reactions   Gadolinium  Derivatives Hives   Sulfur Dioxide Nausea And Vomiting   Isovue  [Iopamidol ] Hives and Itching   Sulfa Antibiotics Nausea And Vomiting    Labs: Lab Results  Component Value Date   HIV1RNAQUANT Not Detected 06/11/2023   HIV1RNAQUANT Not Detected 12/05/2022   HIV1RNAQUANT Not Detected 10/03/2022   CD4TABS 1,124 12/05/2022    RPR and STI Lab Results  Component Value Date   LABRPR REACTIVE (A) 12/05/2022   LABRPR REACTIVE (A) 06/06/2022   LABRPR REACTIVE (A) 01/09/2022   LABRPR REACTIVE (A) 09/28/2021   LABRPR REACTIVE (A) 06/07/2021   RPRTITER 1:1 (H) 12/05/2022   RPRTITER 1:1 (H) 06/06/2022   RPRTITER 1:1 (H) 01/09/2022   RPRTITER 1:2 (H) 09/28/2021   RPRTITER 1:2 (H) 06/07/2021    STI Results GC GC CT CT  12/05/2022  9:58 AM Negative    Negative   Negative    Negative    06/06/2022  4:10 PM Negative    Negative   Negative    Negative    01/09/2022 10:17 AM Negative    Negative   Negative    Negative    09/28/2021 11:18 AM Negative   Negative    05/09/2021 12:25 PM Negative   Negative    02/13/2021  3:57 PM Negative    Negative   Negative    Negative    12/28/2020  2:31 PM Negative    Negative    Negative  Negative    Negative    Negative    11/08/2016  8:15 AM  NOT DETECTED   NOT DETECTED     Hepatitis B Lab Results  Component Value Date   HEPBSAB REACTIVE (A) 05/09/2021   HEPBSAG NON-REACTIVE 06/06/2022   HEPBCAB REACTIVE (A) 05/09/2021   Hepatitis C Lab Results  Component Value Date   HEPCAB NON-REACTIVE 06/06/2022   Hepatitis A Lab Results  Component Value Date   HAV NON-REACTIVE 05/09/2021   Lipids: Lab Results  Component Value Date   CHOL 141 04/08/2023   TRIG 113.0 04/08/2023   HDL 56.60 04/08/2023   CHOLHDL 2 04/08/2023   VLDL 22.6 04/08/2023   LDLCALC 62 04/08/2023    TARGET DATE: The 15th of each month  Assessment: Dawood presents today for his maintenance Cabenuva  injections. Past injections were tolerated well without  issues. Last HIV RNA was undetectable in February 2025. Doing well with no issues today.  Administered cabotegravir  600mg /23mL in left upper outer quadrant of the gluteal muscle. Administered rilpivirine  900 mg/3mL in the right upper outer quadrant of the gluteal muscle. No issues with injections. He will follow up in 2 months for next set of injections.  Today he states he is feeling a little under the weather but overall has no complaints. He is eligible for Shingrix 1/2 today and elects to defer this for now.   He would like to defer STI screening today.   Plan: - Cabenuva  injections administered - Next injections scheduled for 2 months with RPh  -Consider Shingrix 1/2 vaccine and HAV vaccine 2/2 at next visit - Call with any issues or questions  Tolu Tabrina Esty, PharmD [employer] PGY-1

## 2023-10-02 ENCOUNTER — Other Ambulatory Visit: Payer: Self-pay

## 2023-10-02 ENCOUNTER — Ambulatory Visit (INDEPENDENT_AMBULATORY_CARE_PROVIDER_SITE_OTHER): Payer: Medicare HMO | Admitting: Pharmacist

## 2023-10-02 DIAGNOSIS — Z113 Encounter for screening for infections with a predominantly sexual mode of transmission: Secondary | ICD-10-CM

## 2023-10-02 DIAGNOSIS — B20 Human immunodeficiency virus [HIV] disease: Secondary | ICD-10-CM

## 2023-10-02 MED ORDER — CABOTEGRAVIR & RILPIVIRINE ER 600 & 900 MG/3ML IM SUER
1.0000 | Freq: Once | INTRAMUSCULAR | Status: AC
  Administered 2023-10-02: 1 via INTRAMUSCULAR

## 2023-10-04 ENCOUNTER — Other Ambulatory Visit: Payer: Self-pay | Admitting: Internal Medicine

## 2023-10-09 ENCOUNTER — Ambulatory Visit: Payer: Self-pay

## 2023-10-09 NOTE — Telephone Encounter (Signed)
 Copied from CRM [id number]. Topic: Clinical - Red Word Triage >> Oct 09, 2023  5:03 PM Tiffini S wrote: Red Word that prompted transfer to Nurse Triage: patient states that he is having a panic attack with high blood pressure. He is out of medication  and requested a refill three days ago. States that he will need to go to the emergency department.    Additional Information  Negative: [1] Symptoms of anxiety or panic attack AND [2] is a chronic symptom (recurrent or ongoing AND present > 4 weeks)    No appointment, patient just needs refill of Celexa   Answer Assessment - Initial Assessment Questions 1. CONCERN: Did anything happen that prompted you to call today?      Out of Celexa   2. ANXIETY SYMPTOMS: Can you describe how you (your loved one; patient) have been feeling? (e.g., tense, restless, panicky, anxious, keyed up, overwhelmed, sense of impending doom).      Sweaty,  3. ONSET: How long have you been feeling this way? (e.g., hours, days, weeks)     Last night  4. SEVERITY: How would you rate the level of anxiety? (e.g., 0 - 10; or mild, moderate, severe).     7/10 5. FUNCTIONAL IMPAIRMENT: How have these feelings affected your ability to do daily activities? Have you had more difficulty than usual doing your normal daily activities? (e.g., getting better, same, worse; self-care, school, work, interactions)     Some 6. HISTORY: Have you felt this way before? Have you ever been diagnosed with an anxiety problem in the past? (e.g., generalized anxiety disorder, panic attacks, PTSD). If Yes, ask: How was this problem treated? (e.g., medicines, counseling, etc.)     History of the same 7. RISK OF HARM - SUICIDAL IDEATION: Do you ever have thoughts of hurting or killing yourself? If Yes, ask:  Do you have these feelings now? Do you have a plan on how you would do this?     No 8. TREATMENT:  What has been done so far to treat this anxiety? (e.g., medicines, relaxation  strategies). What has helped?     Takes Celexa  but has been out  9. TREATMENT - THERAPIST: Do you have a counselor or therapist? Name?     No 10. POTENTIAL TRIGGERS: Do you drink caffeinated beverages (e.g., coffee, colas, teas), and how much daily? Do you drink alcohol or use any drugs? Have you started any new medicines recently?       Unsure 11. PATIENT SUPPORT: Who is with you now? Who do you live with? Do you have family or friends who you can talk to?        Yes, his sister 20. OTHER SYMPTOMS: Do you have any other symptoms? (e.g., feeling depressed, trouble concentrating, trouble sleeping, trouble breathing, palpitations or fast heartbeat, chest pain, sweating, nausea, or diarrhea)       Mild shortness of breath and sweaty  Protocols used: Anxiety and Panic Attack-A-AH   FYI Only or Action Required?: Action required by provider: medication refill request.  Patient was last seen in primary care on 04/08/2023 by Roslyn Coombe, MD. Called Nurse Triage reporting Panic Attack. Symptoms began yesterday. Interventions attempted: Rest, hydration, or home remedies. Symptoms are: unchanged.  Triage Disposition: No disposition on file.  Patient/caregiver understands and will follow disposition?: No, wishes to speak with PCP

## 2023-10-10 ENCOUNTER — Other Ambulatory Visit: Payer: Self-pay

## 2023-10-10 MED ORDER — AMLODIPINE BESYLATE 10 MG PO TABS: 10.0000 mg | ORAL_TABLET | Freq: Every day | ORAL | 3 refills | Status: AC

## 2023-10-10 NOTE — Telephone Encounter (Signed)
 Refill has been sent.

## 2023-10-14 ENCOUNTER — Other Ambulatory Visit: Payer: Self-pay

## 2023-10-14 ENCOUNTER — Ambulatory Visit: Payer: Self-pay

## 2023-10-14 MED ORDER — CITALOPRAM HYDROBROMIDE 20 MG PO TABS: ORAL_TABLET | ORAL | 3 refills | Status: AC

## 2023-10-14 NOTE — Telephone Encounter (Signed)
 Called and left voicemail, letting Pt know refill has been sent.

## 2023-10-14 NOTE — Telephone Encounter (Signed)
 FYI Only or Action Required?: FYI only for provider.  Patient was last seen in primary care on 04/08/2023 by Norleen Lynwood ORN, MD. Called Nurse Triage reporting Medication Refill. Symptoms began several days ago. Interventions attempted: Nothing. Symptoms are: unchanged.Out of Celexa  x 3 day. Chart indicates refill sent 10/10/23. Pharmacy does not have. Please advise.  Triage Disposition: Call PCP When Office is Open  Patient/caregiver understands and will follow disposition?: Yes         Copied from CRM [id number]. Topic: Clinical - Red Word Triage >> Oct 14, 2023  8:29 AM Adelita E wrote: [facility] that prompted transfer to Nurse Triage: Panic attack. Patient has been having constant panic attacks without his citalopram  (CELEXA ) 20 MG tablet. Reason for Disposition  [1] Prescription refill request for NON-ESSENTIAL medicine (i.e., no harm to patient if med not taken) AND [2] triager unable to refill per department policy  Answer Assessment - Initial Assessment Questions 1. DRUG NAME: What medicine do you need to have refilled?     Celexa  2. REFILLS REMAINING: How many refills are remaining? (Note: The label on the medicine or pill bottle will show how many refills are remaining. If there are no refills remaining, then a renewal may be needed.)     0 3. EXPIRATION DATE: What is the expiration date? (Note: The label states when the prescription will expire, and thus can no longer be refilled.)     N/a 4. PRESCRIBING HCP: Who prescribed it? Reason: If prescribed by specialist, call should be referred to that group.     Dr. Norleen 5. SYMPTOMS: Do you have any symptoms?     Feels panic 6. PREGNANCY: Is there any chance that you are pregnant? When was your last menstrual period?     N/a  Protocols used: Medication Refill and Renewal Call-A-AH

## 2023-10-14 NOTE — Telephone Encounter (Signed)
 Copied from CRM [id number]. Topic: Clinical - Medication Question >> Oct 14, 2023  8:26 AM Revonda D wrote: Reason for CRM: Pt stated that he requested a medication refill for the amLODipine  and the citalopram  on 6/13. Pt stated that the pharmacy informed him that the medication was not sent to the pharmacy as of yet. Pt stated that he needs the citalopram  and is anxious and having panic attacks.I informed the pt that an appt is needed to come into the office because he hasn't been in the office since December and that the citalopram  isn't a routine medication. Pt got upset and stated that he wasn't coming into the office and he doesn't have transportation. Pt stated that this information should have been told to him before and would like for Dr.John or his nurse to give him a callback today.

## 2023-11-21 ENCOUNTER — Other Ambulatory Visit: Payer: Self-pay | Admitting: Pharmacist

## 2023-11-21 DIAGNOSIS — B20 Human immunodeficiency virus [HIV] disease: Secondary | ICD-10-CM

## 2023-11-27 ENCOUNTER — Telehealth: Payer: Self-pay

## 2023-11-27 NOTE — Telephone Encounter (Signed)
 [facility] Patient Advocate Encounter  Patient's medications CABENUVA  have been couriered to [facility] from [facility] [employer] and will be administered at the patients appointment on 12/04/23.  Charmaine Sharps, CPhT Specialty Pharmacy Patient [facility] for Infectious Disease Phone: [phone] Fax:  [phone]

## 2023-12-03 NOTE — Progress Notes (Signed)
 HPI: Mason Morales is a 68 y.o. male who presents to the [facility] pharmacy clinic for Cabenuva  administration.  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   Chronic constipation 07/22/2019   Left lumbar radiculopathy 03/26/2017   Pelvic pain 08/24/2016   Left cervical radiculopathy 05/20/2015   Anxiety and depression 05/20/2015   GERD (gastroesophageal reflux disease) 10/26/2010   Allergic rhinitis 10/26/2010   HTN (hypertension) 10/26/2010   Kidney stones 10/26/2010   DDD (degenerative disc disease), cervical 10/26/2010   DDD (degenerative disc disease), lumbar 10/26/2010   Left shoulder pain 10/26/2010   Preventative health care 10/26/2010    Patient's Medications  New Prescriptions   No medications on file  Previous Medications   AMLODIPINE  (NORVASC ) 10 MG TABLET    Take 1 tablet (10 mg total) by mouth daily.   ATORVASTATIN  (LIPITOR) 40 MG TABLET    Take 1 tablet (40 mg total) by mouth daily.   CABENUVA  600 & 900 MG/3ML INJECTION    INJECT 1 KIT INTO MUSCLE EVERY 2 MONTHS   CITALOPRAM  (CELEXA ) 20 MG TABLET    TAKE 1 TABLET(20 MG) BY MOUTH DAILY   CYANOCOBALAMIN  (B-12 PO)    Take 1 tablet by mouth daily.   VITAMIN D  PO    Take 1 capsule by mouth daily.  Modified Medications   No medications on file  Discontinued Medications   No medications on file    Allergies: Allergies  Allergen Reactions   Gadolinium Derivatives Hives   Sulfur Dioxide Nausea And Vomiting   Isovue  [Iopamidol ] Hives and Itching   Sulfa Antibiotics Nausea And Vomiting    Labs: Lab Results   Component Value Date   HIV1RNAQUANT Not Detected 06/11/2023   HIV1RNAQUANT Not Detected 12/05/2022   HIV1RNAQUANT Not Detected 10/03/2022   CD4TABS 1,124 12/05/2022    RPR and STI Lab Results  Component Value Date   LABRPR REACTIVE (A) 12/05/2022   LABRPR REACTIVE (A) 06/06/2022   LABRPR REACTIVE (A) 01/09/2022   LABRPR REACTIVE (A) 09/28/2021   LABRPR REACTIVE (A) 06/07/2021   RPRTITER 1:1 (H) 12/05/2022   RPRTITER 1:1 (H) 06/06/2022   RPRTITER 1:1 (H) 01/09/2022   RPRTITER 1:2 (H) 09/28/2021   RPRTITER 1:2 (H) 06/07/2021    STI Results GC GC CT CT  12/05/2022  9:58 AM Negative    Negative   Negative    Negative    06/06/2022  4:10 PM Negative    Negative   Negative    Negative    01/09/2022 10:17 AM Negative    Negative   Negative    Negative    09/28/2021 11:18 AM Negative   Negative    05/09/2021 12:25 PM Negative   Negative    02/13/2021  3:57 PM Negative    Negative   Negative    Negative    12/28/2020  2:31 PM Negative    Negative    Negative   Negative    Negative    Negative    11/08/2016  8:15 AM  NOT DETECTED   NOT DETECTED     Hepatitis B  Lab Results  Component Value Date   HEPBSAB REACTIVE (A) 05/09/2021   HEPBSAG NON-REACTIVE 06/06/2022   HEPBCAB REACTIVE (A) 05/09/2021   Hepatitis C Lab Results  Component Value Date   HEPCAB NON-REACTIVE 06/06/2022   Hepatitis A Lab Results  Component Value Date   HAV NON-REACTIVE 05/09/2021   Lipids: Lab Results  Component Value Date   CHOL 141 04/08/2023   TRIG 113.0 04/08/2023   HDL 56.60 04/08/2023   CHOLHDL 2 04/08/2023   VLDL 22.6 04/08/2023   LDLCALC 62 04/08/2023    TARGET DATE: 15th  Assessment: Mason Morales presents today for his maintenance Cabenuva  injections. Past injections were tolerated well without issues. Last HIV RNA was negative on 06/11/23. We will repeat his HIV RNA today. Doing well with no issues today. His last CD4 was  1124 on 12/05/22. We will repeat his CD4 today since  it has been a year since his last lab. He defers STI testing today as he is not active at this time.   Administered cabotegravir  600mg /78mL in left upper outer quadrant of the gluteal muscle. Administered rilpivirine  900 mg/3mL in the right upper outer quadrant of the gluteal muscle. No issues with injections. He will follow up in 2 months for next set of injections.  Plan: - Check HIV RNA level - Check CD4 - Cabenuva  injections administered - Next injections scheduled for 01/31/24 with Dr. Overton - Call with any issues or questions  Elma Fail, PharmD PGY1 Clinical Pharmacist [facility]  12/03/2023 4:23 PM

## 2023-12-04 ENCOUNTER — Ambulatory Visit: Admitting: Pharmacist

## 2023-12-04 ENCOUNTER — Other Ambulatory Visit: Payer: Self-pay

## 2023-12-04 DIAGNOSIS — Z113 Encounter for screening for infections with a predominantly sexual mode of transmission: Secondary | ICD-10-CM

## 2023-12-04 DIAGNOSIS — B20 Human immunodeficiency virus [HIV] disease: Secondary | ICD-10-CM

## 2023-12-04 MED ORDER — CABOTEGRAVIR & RILPIVIRINE ER 600 & 900 MG/3ML IM SUER
1.0000 | Freq: Once | INTRAMUSCULAR | Status: AC
  Administered 2023-12-04 (×2): 1 via INTRAMUSCULAR

## 2023-12-05 LAB — T-HELPER CELLS (CD4) COUNT (NOT AT ARMC)
CD4 % Helper T Cell: 40 % (ref 33–65)
CD4 T Cell Abs: 1149 /uL (ref 400–1790)

## 2023-12-06 LAB — HIV-1 RNA QUANT-NO REFLEX-BLD
HIV 1 RNA Quant: NOT DETECTED {copies}/mL
HIV-1 RNA Quant, Log: NOT DETECTED {Log_copies}/mL

## 2024-01-22 ENCOUNTER — Telehealth: Payer: Self-pay

## 2024-01-22 NOTE — Telephone Encounter (Signed)
 [facility] Patient Advocate Encounter  Patient's medications CABENUVA  have been couriered to [facility] from [facility] [employer] and will be administered at the patients appointment on 01/31/24.  Charmaine Sharps, CPhT Specialty Pharmacy Patient [facility] for Infectious Disease Phone: [phone] Fax:  [phone]

## 2024-01-31 ENCOUNTER — Ambulatory Visit: Admitting: Internal Medicine

## 2024-02-03 DIAGNOSIS — J343 Hypertrophy of nasal turbinates: Secondary | ICD-10-CM | POA: Diagnosis not present

## 2024-02-03 DIAGNOSIS — J342 Deviated nasal septum: Secondary | ICD-10-CM | POA: Diagnosis not present

## 2024-02-04 ENCOUNTER — Emergency Department (HOSPITAL_COMMUNITY)
Admission: EM | Admit: 2024-02-04 | Discharge: 2024-02-04 | Disposition: A | Discharge status: Home / Self Care | Attending: Emergency Medicine | Admitting: Emergency Medicine

## 2024-02-04 ENCOUNTER — Ambulatory Visit (INDEPENDENT_AMBULATORY_CARE_PROVIDER_SITE_OTHER): Admitting: Internal Medicine

## 2024-02-04 ENCOUNTER — Ambulatory Visit

## 2024-02-04 ENCOUNTER — Emergency Department (HOSPITAL_COMMUNITY)

## 2024-02-04 ENCOUNTER — Encounter (HOSPITAL_COMMUNITY): Payer: Self-pay

## 2024-02-04 ENCOUNTER — Other Ambulatory Visit: Payer: Self-pay

## 2024-02-04 VITALS — BP 146/95 | HR 82 | Temp 98.0°F | Resp 16 | Wt 149.2 lb

## 2024-02-04 DIAGNOSIS — R197 Diarrhea, unspecified: Secondary | ICD-10-CM | POA: Diagnosis not present

## 2024-02-04 DIAGNOSIS — K529 Noninfective gastroenteritis and colitis, unspecified: Secondary | ICD-10-CM | POA: Diagnosis not present

## 2024-02-04 DIAGNOSIS — Z21 Asymptomatic human immunodeficiency virus [HIV] infection status: Secondary | ICD-10-CM | POA: Diagnosis not present

## 2024-02-04 DIAGNOSIS — Z79899 Other long term (current) drug therapy: Secondary | ICD-10-CM | POA: Diagnosis not present

## 2024-02-04 DIAGNOSIS — R1111 Vomiting without nausea: Secondary | ICD-10-CM | POA: Diagnosis not present

## 2024-02-04 DIAGNOSIS — R0602 Shortness of breath: Secondary | ICD-10-CM | POA: Diagnosis not present

## 2024-02-04 DIAGNOSIS — G4489 Other headache syndrome: Secondary | ICD-10-CM | POA: Diagnosis not present

## 2024-02-04 DIAGNOSIS — R112 Nausea with vomiting, unspecified: Secondary | ICD-10-CM | POA: Diagnosis not present

## 2024-02-04 DIAGNOSIS — B20 Human immunodeficiency virus [HIV] disease: Secondary | ICD-10-CM | POA: Diagnosis not present

## 2024-02-04 DIAGNOSIS — A084 Viral intestinal infection, unspecified: Secondary | ICD-10-CM | POA: Insufficient documentation

## 2024-02-04 DIAGNOSIS — I10 Essential (primary) hypertension: Secondary | ICD-10-CM | POA: Diagnosis not present

## 2024-02-04 DIAGNOSIS — Z743 Need for continuous supervision: Secondary | ICD-10-CM | POA: Diagnosis not present

## 2024-02-04 DIAGNOSIS — Z8619 Personal history of other infectious and parasitic diseases: Secondary | ICD-10-CM

## 2024-02-04 LAB — CBC WITH DIFFERENTIAL/PLATELET
Abs Immature Granulocytes: 0.05 K/uL (ref 0.00–0.07)
Basophils Absolute: 0.1 K/uL (ref 0.0–0.1)
Basophils Relative: 1 %
Eosinophils Absolute: 0.1 K/uL (ref 0.0–0.5)
Eosinophils Relative: 1 %
HCT: 41.9 % (ref 39.0–52.0)
Hemoglobin: 13.8 g/dL (ref 13.0–17.0)
Immature Granulocytes: 1 %
Lymphocytes Relative: 18 %
Lymphs Abs: 1.9 K/uL (ref 0.7–4.0)
MCH: 29 pg (ref 26.0–34.0)
MCHC: 32.9 g/dL (ref 30.0–36.0)
MCV: 88 fL (ref 80.0–100.0)
Monocytes Absolute: 0.6 K/uL (ref 0.1–1.0)
Monocytes Relative: 5 %
Neutro Abs: 7.8 K/uL — ABNORMAL HIGH (ref 1.7–7.7)
Neutrophils Relative %: 74 %
Platelets: 251 K/uL (ref 150–400)
RBC: 4.76 MIL/uL (ref 4.22–5.81)
RDW: 12.5 % (ref 11.5–15.5)
WBC: 10.4 K/uL (ref 4.0–10.5)
nRBC: 0 % (ref 0.0–0.2)

## 2024-02-04 LAB — COMPREHENSIVE METABOLIC PANEL WITH GFR
ALT: 9 U/L (ref 0–44)
AST: 21 U/L (ref 15–41)
Albumin: 4.2 g/dL (ref 3.5–5.0)
Alkaline Phosphatase: 84 U/L (ref 38–126)
Anion gap: 14 (ref 5–15)
BUN: 16 mg/dL (ref 8–23)
CO2: 22 mmol/L (ref 22–32)
Calcium: 9.7 mg/dL (ref 8.9–10.3)
Chloride: 100 mmol/L (ref 98–111)
Creatinine, Ser: 1.21 mg/dL (ref 0.61–1.24)
GFR, Estimated: >60 mL/min (ref >60)
Glucose, Bld: 90 mg/dL (ref 70–99)
Potassium: 3.5 mmol/L (ref 3.5–5.1)
Sodium: 136 mmol/L (ref 135–145)
Total Bilirubin: 0.5 mg/dL (ref 0.0–1.2)
Total Protein: 7.5 g/dL (ref 6.5–8.1)

## 2024-02-04 LAB — URINALYSIS, ROUTINE W REFLEX MICROSCOPIC
Bilirubin Urine: NEGATIVE
Glucose, UA: NEGATIVE mg/dL
Hgb urine dipstick: NEGATIVE
Ketones, ur: NEGATIVE mg/dL
Leukocytes,Ua: NEGATIVE
Nitrite: NEGATIVE
Protein, ur: NEGATIVE mg/dL
Specific Gravity, Urine: 1.018 (ref 1.005–1.030)
pH: 8 (ref 5.0–8.0)

## 2024-02-04 LAB — RESP PANEL BY RT-PCR (RSV, FLU A&B, COVID)  RVPGX2
Influenza A by PCR: NEGATIVE
Influenza B by PCR: NEGATIVE
Resp Syncytial Virus by PCR: NEGATIVE
SARS Coronavirus 2 by RT PCR: NEGATIVE

## 2024-02-04 LAB — LIPASE, BLOOD: Lipase: 31 U/L (ref 11–51)

## 2024-02-04 MED ORDER — CABOTEGRAVIR & RILPIVIRINE ER 600 & 900 MG/3ML IM SUER
1.0000 | Freq: Once | INTRAMUSCULAR | Status: AC
  Administered 2024-02-04: 1 via INTRAMUSCULAR

## 2024-02-04 MED ORDER — ONDANSETRON HCL 4 MG PO TABS: 4.0000 mg | ORAL_TABLET | Freq: Four times a day (QID) | ORAL | 0 refills | Status: AC

## 2024-02-04 MED ORDER — ONDANSETRON HCL 4 MG/2ML IJ SOLN
4.0000 mg | Freq: Once | INTRAMUSCULAR | Status: AC
  Administered 2024-02-04: 4 mg via INTRAVENOUS
  Filled 2024-02-04: qty 2

## 2024-02-04 NOTE — Progress Notes (Signed)
 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 male htn, gerd, kidney stones, previously on appretude study who acquired HIV while taking appretude, here for f/u hiv care  06/2021 id clinic visit He has been in the appretude study with Dr Fleeta Dam/[facility] (hptn 084 study). He had transitioned to commercial appretude after the study  He was infected with monkey pox/treated in 12/2020  It appears he failed to get one injection of apretude  in 03/2021. He presented to the ER 12/29 with flu like symptoms. He returned to [facility] 04/2021 with viral load 246 although hiv screen was negative. Rapid start initiated with 2nd viral load of 2610 the same month. His initial reflex genotype/integrase testing showed no resistance.  He saw dr Lindia 2/23 and had an archive genotype ordered  He has been on symtuza . No missed dose last 4 weeks. Tolerate it well.  Other  meds: Celexa  Amlodipine  Bentyl  Mobic  Asa Prednisone    He had syphilis infection hx. Rpr has been stable at 1:2. He was given early latent treatment 2 months prior to this visit (04/2021), and years ago also for early latent (never treated as complicated syphilis or late latent syphilis)   Patient is bi-sexual, but doesn't do receptive anal sex. 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, Human resources officer No substance use; occasional thc (used for anxiety and panic attack) Hobbies -- professional equestrian; enjoy dogs pet; tennis; skiing  Recent infectious disease serology: 05/09/2021 Rpr 1:2 -- has been at this level for the past several years dating back to 2018 Hep a Ab nr; hep b sAb reactive; hep b cAb reactive; hep b sAg NR; hep c Ab nr Hiv rna 2610; hiv antibody negative Hiv genotype -- no bictegravir, carbotegravir, eltegravir resistance; RT RAM r211a, without any phenotypic resistance; PR RAM e35d and v77i, without protease inhibitor resistance Quantiferon gold negative  06/07/2021 Hiv rna negative; cd4 966 Hiv ab negative Triple screen gc/chlamydia negative  09/28/21 id clinic f/u Sex 10 days ago broke condom-- that person has gonorrhea (texted him). No sx. Wants testing. No oral/anal receptive sex Doing well Missed a dose of symtuza  the last several weeks No complaint  Wants to get back on cabenuva    01/09/22 id clinic f/u Genotype testing with hiv transmission while on cabenuva  done --> no ISI resistance or rilpivirine  resistance He had restarted cabenuva   09/28/2021; his last shot was about almost 6 weeks ago 11/02/21 virologic remains undetectable No complaint today  No recent sexual exposure   06/06/22 id clinic f/u Patient had missed his last q21month cabenuva  injection; he last received it 03/06/2022 He has been taking symtuza  in place of cabenuva . He  wants to get back to cabenuva   He is functionally cured for hep b He wants to continue cabenuva  -- discuss lack of antihep b activity and he is ok. No clear risk for reactivation  Stressed out preparing for horse competition. Father dementia passed away 3 years ago Not suicidal or homicidal  Social - not sexually active (last encounter 1 month). No anal receptive sex. Retired Charity fundraiser. No current job  No health concern  No f/c, weight loss, cough, n/v/diarrhea, rash, joint pain    01/29/23 id clinic f/u Patient has kidney stones 4 episodes. 2 weeks ago has similar ache/pain that started in the lower right flank but then would travel down to the right groin/testicle. No fever chill. Some nausea. Takes naproxen . No blood in urine/urgency/frequency. He has been drinking lots of coffee  Doing well from hiv standpoint on cabenuva . Had to restart cabenuva  due to insurance issue/lapse back in 04/2022  No other concern  Reviewed labs from 11/2022 std testing negative; serofast on syphilis rpr; hiv undetectable  Will see pcp for colon cancer/prostate cancer f/u    08/06/23 id clinic f/u Doing well on cabenuva ; last shot 06/11/23; q60months protocol Hiv undetectable since just after treatment started 05/2021 No concern today Review medication list -- amlodipine , citalopram , tramadol , cabenuva , atorvastatin  40, vit d, b12 pill Had recent hydrocelectomy 06/2023    02/04/24 id clinic f/u Ongoing n/v/diarrhea/malaise requiring urgent care visit Very run down still but gi sx improving Here for cabenuva  No f/c/bloody stool    Review of Systems: ROS All other ROS negative      Past Medical History:  Diagnosis Date   Allergic rhinitis, cause unspecified 10/26/2010   Allergy    DDD (degenerative disc disease), cervical 10/26/2010   DDD (degenerative disc disease), lumbar 10/26/2010   Depression    Generalized headaches    GERD (gastroesophageal reflux disease)    History of kidney  stones    HIV disease (HCC) 06/06/2021   HTN (hypertension) 10/26/2010   Hypertension    Kidney stones    Panic attack    Vaccine counseling 06/06/2021    Social History   Tobacco Use   Smoking status: Former    Current packs/day: 0.00    Types: Cigarettes    Quit date: 03/20/2014    Years since quitting: 9.8   Smokeless tobacco: Never  Vaping Use   Vaping status: Never Used  Substance Use Topics   Alcohol use: Yes    Alcohol/week: 6.0 standard drinks of alcohol    Types: 6 Cans of beer per week    Comment: occasionally   Drug use: Yes    Types: Marijuana    Comment: THC-daily    Family History  Problem Relation Age of Onset   Hyperlipidemia Other    Heart disease Other    Stroke Other    Hypertension Other    Diabetes Other    Cancer Other        prostate cancer   Diabetes Other    Alcohol abuse Other    Cancer Other        breast cancer   Stroke Other    Colon cancer Neg Hx  Esophageal cancer Neg Hx    Pancreatic cancer Neg Hx    Rectal cancer Neg Hx    Stomach cancer Neg Hx     Allergies  Allergen Reactions   Gadolinium Derivatives Hives   Sulfur Dioxide Nausea And Vomiting   Isovue  [Iopamidol ] Hives and Itching   Sulfa Antibiotics Nausea And Vomiting    OBJECTIVE: Vitals:   02/04/24 1340  BP: (!) 146/95  Pulse: 82  Resp: 16  Temp: 98 F (36.7 C)  TempSrc: Temporal  SpO2: 99%  Weight: 149 lb 3.2 oz (67.7 kg)   Body mass index is 22.03 kg/m.   Physical Exam General/constitutional: appearred tired, but pleasant HEENT: Normocephalic, PER, Conj Clear, EOMI, Oropharynx clear Neck supple CV: rrr no mrg Lungs: clear to auscultation, normal respiratory effort Abd: Soft, Nontender Ext: no edema Skin: No Rash Neuro: nonfocal MSK: no peripheral joint swelling/tenderness/warmth; back spines nontender           Lab: Lab Results  Component Value Date   WBC 10.4 02/04/2024   HGB 13.8 02/04/2024   HCT 41.9 02/04/2024   MCV  88.0 02/04/2024   PLT 251 02/04/2024   Last metabolic panel Lab Results  Component Value Date   GLUCOSE 90 02/04/2024   NA 136 02/04/2024   K 3.5 02/04/2024   CL 100 02/04/2024   CO2 22 02/04/2024   BUN 16 02/04/2024   CREATININE 1.21 02/04/2024   EGFR 60 06/06/2022   CALCIUM  9.7 02/04/2024   PHOS 2.1 (L) 08/20/2019   PROT 7.5 02/04/2024   ALBUMIN  4.2 02/04/2024   BILITOT 0.5 02/04/2024   ALKPHOS 84 02/04/2024   AST 21 02/04/2024   ALT 9 02/04/2024   ANIONGAP 14 02/04/2024     HIV: Lab Results  Component Value Date   HIV1RNAQUANT NOT DETECTED 12/04/2023   Lab Results  Component Value Date   CD4TCELL 40 12/04/2023   CD4TABS 1,149 12/04/2023    05/2023      <20      /      11/2022      <20      /     1120 (37%) 12/2021       <20 10/2021       <20 09/2021       <20   Microbiology:  Serology:  Imaging:   Assessment/plan: Problem List Items Addressed This Visit   None     #hiv #cad risk/reprieve Appears to have an acute retroviral syndrome early 04/2021 after he missed dose of appretude 03/2021 (only 1 missed dose) His viral load was 2600 when a genotype was done and showed no rilpivirine  mutation or cabotegravir  mutation I am unclear if at this time his viral load undetectable an archived genotype would be helpful  He was started on symtuza  since 05/2021 and is currently undetectable  He prefers injectable for treatment   It is unclear why he contracted hiv given the duration of appretude and no evidence cabutegravir resistance (albeit no archive genotype available)  If archive is not able to be done, could consider dovato/biktarvy or cabenuva  as next medication given improved DDI profile over symtuza . Will discuss this with Dr Fleeta Rothman. Close monitoring would be required initially given he contracted hiv while on appretude to detect integrase strand inhibitor resistance  Archive monogram 2/15 was negative for integrase resistance  He has been on symtuza   but wants go go back on injection  Back on cabenuva  in June. Remains well controlled on it  He is on q-79months injection   06/06/22 taking symtuza  since he missed cabenuva  in 04/2022; wants to get back. Prior functionally cured hep b  02/04/24 doing well on cabenuva   -discussed u=u -encourage compliance -continue current HIV medication with cabenuva  -labs 6 months on f/u; continue pharmacy visit for cabenuva  q51months -continue lipitor 40 mg   #gastroenteritis Recovering  Continue supportive care   #hx syphilis Rpr serofast at 1:2 to 1:1 since 2018. 1980s treatment once as early latent Stable rpr titer 11/2022   #hcm -vaccination Shingrix -- rx given 01/09/22 Meningococcal -- due for for meningococcal booster S/p prevnar20 05/2021 Zoster -- rx given 2 shot series; patient to bring rx here and nursing can help administer (2nd shot 2-6 months from first shot) Tdap 2018 -hepatitis Prior hep b infection -- functionally cured 05/2021 hep c ab negative  -std screen  negative throat/urine gc/chlam 11/2022 Rpr serofast 1:1 on 11/2022 Not sexually active and wanted to defer std testing as of 08/06/2023 -annual tb screen  negative 05/2021 -cancer screening  Patient bisexual but doesn't practice receptive anal intercourse and wants to defer anal pap Colonoscopy negative 2021 (polyps noncancer) - due every 5 years for now Prostate psa screening previously negative - will see his pcp again this year -- advise to set this up    Follow-up: No follow-ups on file.  Constance ONEIDA Passer, MD [facility] [phone] pager   [phone] cell 02/04/2024, 2:05 PM

## 2024-02-04 NOTE — Discharge Instructions (Addendum)
 It was a pleasure taking part in your care.  As discussed, your work appears reassuring.  I suspect you most likely have a viral GI bug.  Please increase fluid hydration with Pedialyte or other electrolyte supplementation beverages.  Please take Zofran  every 6 hours as needed for nausea.  Follow-up with your PCP.  Return to the ED with new symptoms.

## 2024-02-04 NOTE — ED Triage Notes (Signed)
 Pt from home via EMS for productive cough, /N /V /D, chills, body aches, SOB x6 hours.  /V x4 times but is able to keep small amounts of fluids down.

## 2024-02-04 NOTE — Patient Instructions (Signed)
 Will just get cabenuva  today no labs  You have been doing very well from hiv standpoint   Continue fluid hydration and rest for gastroenteritis Let us  know if fever, chill, can't keep po down or bloody stool --> go to ER as well   See pharmacy for ongoing cabenuva   We can touch base again in 6 months otherwise

## 2024-02-04 NOTE — Addendum Note (Signed)
 Addended by: DALILA ANNABELLA SQUIBB on: 02/04/2024 03:27 PM   Modules accepted: Orders

## 2024-02-04 NOTE — ED Notes (Signed)
 RN provided pt 400 mL water , 400 mL sprite, and pack of graham crackers.

## 2024-02-04 NOTE — ED Provider Notes (Signed)
 [place] EMERGENCY DEPARTMENT AT [facility] Provider Note   CSN: [id number] Arrival date & time: 02/04/24  9690     Patient presents with: Influenza   Mason Morales is a 68 y.o. male with history of GERD, hypertension, HIV disease, kidney stones, panic attack.  Patient presents to ED for evaluation of nausea, vomiting with diarrhea and shortness of breath.  Reports that symptoms began about 6 hours ago.  Reports that he has had about 6-7 episodes of diarrhea as well as vomiting.  Denies any blood in his stool or vomit.  Denies fevers at home, abdominal pain.  Denies any chest pain but is endorsing shortness of breath.  States that he is set to have ENT surgery soon for deviated septum but reports that shortness of breath is new.  Denies lightheadedness, dizziness or weakness.  Denies any leg swelling.  Denies new food exposures.  Denies known sick contacts.  Also complaining of bodyaches and chills and feels as if he has a virus.   Influenza Presenting symptoms: diarrhea, nausea and vomiting        Prior to Admission medications   Medication Sig Start Date End Date Taking? Authorizing Provider  ondansetron  (ZOFRAN ) 4 MG tablet Take 1 tablet (4 mg total) by mouth every 6 (six) hours. 02/04/24  Yes Ruthell Lonni FALCON, PA-C  amLODipine  (NORVASC ) 10 MG tablet Take 1 tablet (10 mg total) by mouth daily. 10/10/23   Norleen Lynwood ORN, MD  atorvastatin  (LIPITOR) 40 MG tablet Take 1 tablet (40 mg total) by mouth daily. 06/06/22 06/20/23  [place]ton Constance DASEN, MD  CABENUVA  600 & 900 MG/3ML injection INJECT 1 KIT INTO MUSCLE EVERY 2 MONTHS 11/21/23   Waddell Alan PARAS, RPH-CPP  citalopram  (CELEXA ) 20 MG tablet TAKE 1 TABLET(20 MG) BY MOUTH DAILY 10/14/23   Norleen Lynwood ORN, MD  Cyanocobalamin  (B-12 PO) Take 1 tablet by mouth daily.    [provider]  VITAMIN D  PO Take 1 capsule by mouth daily.    [provider]    Allergies: Gadolinium derivatives, Sulfur dioxide, Isovue   [iopamidol ], and Sulfa antibiotics    Review of Systems  Gastrointestinal:  Positive for diarrhea, nausea and vomiting.  All other systems reviewed and are negative.   Updated Vital Signs BP (!) 154/99   Pulse 65   Temp 98.3 F (36.8 C) (Oral)   Resp 12   Ht 5' 9 (1.753 m)   Wt 70.3 kg   SpO2 100%   BMI 22.89 kg/m   Physical Exam Vitals and nursing note reviewed.  Constitutional:      General: He is not in acute distress.    Appearance: He is well-developed.  HENT:     Head: Normocephalic and atraumatic.  Eyes:     Conjunctiva/sclera: Conjunctivae normal.  Cardiovascular:     Rate and Rhythm: Normal rate and regular rhythm.     Heart sounds: No murmur heard. Pulmonary:     Effort: Pulmonary effort is normal. No respiratory distress.     Breath sounds: Normal breath sounds.  Abdominal:     Palpations: Abdomen is soft.     Tenderness: There is no abdominal tenderness.     Comments: Abdomen soft, nontender.  No overlying skin change.  No rebound or guarding.  Musculoskeletal:        General: No swelling.     Cervical back: Neck supple.  Skin:    General: Skin is warm and dry.     Capillary  Refill: Capillary refill takes less than 2 seconds.  Neurological:     Mental Status: He is alert and oriented to person, place, and time. Mental status is at baseline.  Psychiatric:        Mood and Affect: Mood normal.     (all labs ordered are listed, but only abnormal results are displayed) Labs Reviewed  CBC WITH DIFFERENTIAL/PLATELET - Abnormal; Notable for the following components:      Result Value   Neutro Abs 7.8 (*)    All other components within normal limits  RESP PANEL BY RT-PCR (RSV, FLU A&B, COVID)  [id number]  COMPREHENSIVE METABOLIC PANEL WITH GFR  LIPASE, BLOOD  URINALYSIS, ROUTINE W REFLEX MICROSCOPIC    EKG: None  Radiology: DG Chest Portable 1 View Result Date: 02/04/2024 EXAM: 1 VIEW XRAY OF THE CHEST 02/04/2024 04:02:00 AM COMPARISON: Chest  series 2 views 10/22/2020. CLINICAL HISTORY: sob. Flu like symptoms for the past few hours. FINDINGS: LUNGS AND PLEURA: No focal pulmonary opacity. No pulmonary edema. No pleural effusion. No pneumothorax. HEART AND MEDIASTINUM: No acute abnormality of the cardiac and mediastinal silhouettes. There is scattered calcific plaque in the thoracic aorta. BONES AND SOFT TISSUES: No acute osseous abnormality. There is thoracic spondylosis and thoracic spine bridging enthesopathy. IMPRESSION: 1. No evidence of acute chest process. Electronically signed by: Francis Quam MD 02/04/2024 04:28 AM EDT RP Workstation: [id number]    Procedures   Medications Ordered in the ED  ondansetron  (ZOFRAN ) injection 4 mg (4 mg Intravenous Given 02/04/24 0335)   Medical Decision Making Amount and/or Complexity of Data Reviewed Labs: ordered. Radiology: ordered. ECG/medicine tests: ordered.  Risk Prescription drug management.   68 year old male presents for evaluation.  On exam, hemodynamically stable.  Afebrile, nontachycardic.  Lung sounds clear bilaterally, no hypoxia.  Abdomen soft and compressible with no tenderness.  Neurological examination at baseline.  Given Zofran .  Patient CBC grossly unremarkable without leukocytosis or anemia.  Metabolic panel grossly unremarkable without electrolyte derangement, anion gap 14, no transaminitis.  Viral panel negative for all.  Urinalysis negative for all.  Lipase 31.  Chest x-ray markable.  EKG nonischemic.  Reassessed after medication.  Patient reports feeling much better.  Patient passed p.o. fluid challenge.  Suspect viral gastroenteritis in this patient.  Have advised him to treat symptoms conservatively.  Will send patient home with Zofran .  He is advised to increase fluid intake.  Consider CT scan of patient abdomen however no tenderness and no white count, no indications for imaging at this time.  Patient advised to follow-up with PCP.  Given return precautions and  he voiced understanding.  Stable to discharge.    Final diagnoses:  Viral gastroenteritis    ED Discharge Orders          Ordered    ondansetron  (ZOFRAN ) 4 MG tablet  Every 6 hours        02/04/24 0546               Ruthell Lonni FALCON, PA-C 02/04/24 0546    Theadore Ozell HERO, MD 02/04/24 [phone]

## 2024-02-10 ENCOUNTER — Other Ambulatory Visit: Payer: Self-pay | Admitting: Otolaryngology

## 2024-03-18 ENCOUNTER — Other Ambulatory Visit: Payer: Self-pay

## 2024-03-18 ENCOUNTER — Encounter (HOSPITAL_BASED_OUTPATIENT_CLINIC_OR_DEPARTMENT_OTHER): Payer: Self-pay | Admitting: Otolaryngology

## 2024-03-23 ENCOUNTER — Ambulatory Visit (HOSPITAL_BASED_OUTPATIENT_CLINIC_OR_DEPARTMENT_OTHER): Admitting: Anesthesiology

## 2024-03-23 ENCOUNTER — Encounter (HOSPITAL_BASED_OUTPATIENT_CLINIC_OR_DEPARTMENT_OTHER)
Admission: RE | Disposition: A | Discharge status: Home / Self Care | Payer: Self-pay | Source: Home / Self Care | Attending: Otolaryngology

## 2024-03-23 ENCOUNTER — Encounter (HOSPITAL_BASED_OUTPATIENT_CLINIC_OR_DEPARTMENT_OTHER): Payer: Self-pay | Admitting: Otolaryngology

## 2024-03-23 ENCOUNTER — Ambulatory Visit (HOSPITAL_BASED_OUTPATIENT_CLINIC_OR_DEPARTMENT_OTHER)
Admission: RE | Admit: 2024-03-23 | Discharge: 2024-03-23 | Disposition: A | Discharge status: Home / Self Care | Attending: Otolaryngology | Admitting: Otolaryngology

## 2024-03-23 ENCOUNTER — Other Ambulatory Visit: Payer: Self-pay

## 2024-03-23 DIAGNOSIS — J343 Hypertrophy of nasal turbinates: Secondary | ICD-10-CM

## 2024-03-23 DIAGNOSIS — J342 Deviated nasal septum: Secondary | ICD-10-CM

## 2024-03-23 HISTORY — PX: NASAL SEPTOPLASTY W/ TURBINOPLASTY: SHX2070

## 2024-03-23 SURGERY — SEPTOPLASTY, NOSE, WITH NASAL TURBINATE REDUCTION
Anesthesia: General | Site: Nose | Laterality: Bilateral

## 2024-03-23 MED ORDER — LIDOCAINE 2% (20 MG/ML) 5 ML SYRINGE
INTRAMUSCULAR | Status: AC
  Filled 2024-03-23: qty 5

## 2024-03-23 MED ORDER — MIDAZOLAM HCL 2 MG/2ML IJ SOLN
INTRAMUSCULAR | Status: AC
  Filled 2024-03-23: qty 2

## 2024-03-23 MED ORDER — FENTANYL CITRATE (PF) 100 MCG/2ML IJ SOLN
INTRAMUSCULAR | Status: AC
  Filled 2024-03-23: qty 2

## 2024-03-23 MED ORDER — OXYMETAZOLINE HCL 0.05 % NA SOLN
NASAL | Status: DC | PRN
  Administered 2024-03-23: 1 via TOPICAL

## 2024-03-23 MED ORDER — MIDAZOLAM HCL 5 MG/5ML IJ SOLN
INTRAMUSCULAR | Status: DC | PRN
  Administered 2024-03-23: 2 mg via INTRAVENOUS

## 2024-03-23 MED ORDER — FENTANYL CITRATE (PF) 100 MCG/2ML IJ SOLN: 25.0000 ug | INTRAMUSCULAR | Status: DC | PRN

## 2024-03-23 MED ORDER — FENTANYL CITRATE (PF) 100 MCG/2ML IJ SOLN
INTRAMUSCULAR | Status: DC | PRN
  Administered 2024-03-23: 100 ug via INTRAVENOUS

## 2024-03-23 MED ORDER — SUGAMMADEX SODIUM 200 MG/2ML IV SOLN
INTRAVENOUS | Status: DC | PRN
  Administered 2024-03-23: 50 mg via INTRAVENOUS
  Administered 2024-03-23: 150 mg via INTRAVENOUS

## 2024-03-23 MED ORDER — LACTATED RINGERS IV SOLN: INTRAVENOUS | Status: DC

## 2024-03-23 MED ORDER — CHLORHEXIDINE GLUCONATE CLOTH 2 % EX PADS: 6.0000 | MEDICATED_PAD | Freq: Once | CUTANEOUS | Status: DC

## 2024-03-23 MED ORDER — PROPOFOL 500 MG/50ML IV EMUL
INTRAVENOUS | Status: DC | PRN
  Administered 2024-03-23: 200 ug/kg/min via INTRAVENOUS

## 2024-03-23 MED ORDER — KETOROLAC TROMETHAMINE 30 MG/ML IJ SOLN
INTRAMUSCULAR | Status: AC
  Filled 2024-03-23: qty 1

## 2024-03-23 MED ORDER — BUPIVACAINE-EPINEPHRINE (PF) 0.25% -1:200000 IJ SOLN
INTRAMUSCULAR | Status: DC | PRN
  Administered 2024-03-23: 6 mL

## 2024-03-23 MED ORDER — ROCURONIUM 10MG/ML (10ML) SYRINGE FOR MEDFUSION PUMP - OPTIME
INTRAVENOUS | Status: DC | PRN
  Administered 2024-03-23: 50 mg via INTRAVENOUS

## 2024-03-23 MED ORDER — ACETAMINOPHEN 500 MG PO TABS
1000.0000 mg | ORAL_TABLET | Freq: Once | ORAL | Status: AC
  Administered 2024-03-23: 1000 mg via ORAL

## 2024-03-23 MED ORDER — EPHEDRINE SULFATE (PRESSORS) 25 MG/5ML IV SOSY
PREFILLED_SYRINGE | INTRAVENOUS | Status: DC | PRN
  Administered 2024-03-23: 15 mg via INTRAVENOUS

## 2024-03-23 MED ORDER — ONDANSETRON HCL 4 MG/2ML IJ SOLN
INTRAMUSCULAR | Status: AC
  Filled 2024-03-23: qty 4

## 2024-03-23 MED ORDER — DEXAMETHASONE SOD PHOSPHATE PF 10 MG/ML IJ SOLN
INTRAMUSCULAR | Status: DC | PRN
  Administered 2024-03-23: 10 mg via INTRAVENOUS

## 2024-03-23 MED ORDER — PROPOFOL 10 MG/ML IV BOLUS
INTRAVENOUS | Status: DC | PRN
  Administered 2024-03-23: 200 mg via INTRAVENOUS

## 2024-03-23 MED ORDER — HYDROCODONE-ACETAMINOPHEN 5-325 MG PO TABS: 1.0000 | ORAL_TABLET | ORAL | 0 refills | Status: AC | PRN

## 2024-03-23 MED ORDER — LIDOCAINE HCL (CARDIAC) PF 100 MG/5ML IV SOSY
PREFILLED_SYRINGE | INTRAVENOUS | Status: DC | PRN
  Administered 2024-03-23: 100 mg via INTRATRACHEAL

## 2024-03-23 MED ORDER — SODIUM CHLORIDE 0.9 % IV SOLN
INTRAVENOUS | Status: AC | PRN
  Administered 2024-03-23: 500 mL

## 2024-03-23 MED ORDER — CEFAZOLIN SODIUM-DEXTROSE 2-4 GM/100ML-% IV SOLN: 2.0000 g | INTRAVENOUS | Status: DC

## 2024-03-23 MED ORDER — OXYCODONE HCL 5 MG PO TABS: 5.0000 mg | ORAL_TABLET | Freq: Once | ORAL | Status: DC | PRN

## 2024-03-23 MED ORDER — PHENYLEPHRINE HCL (PRESSORS) 10 MG/ML IV SOLN
INTRAVENOUS | Status: DC | PRN
  Administered 2024-03-23: 80 ug via INTRAVENOUS

## 2024-03-23 MED ORDER — EPHEDRINE 5 MG/ML INJ
INTRAVENOUS | Status: AC
  Filled 2024-03-23: qty 5

## 2024-03-23 MED ORDER — PHENYLEPHRINE 80 MCG/ML (10ML) SYRINGE FOR IV PUSH (FOR BLOOD PRESSURE SUPPORT)
PREFILLED_SYRINGE | INTRAVENOUS | Status: AC
  Filled 2024-03-23: qty 10

## 2024-03-23 MED ORDER — ONDANSETRON HCL 4 MG/2ML IJ SOLN
INTRAMUSCULAR | Status: DC | PRN
  Administered 2024-03-23: 4 mg via INTRAVENOUS

## 2024-03-23 MED ORDER — CEFAZOLIN SODIUM-DEXTROSE 2-4 GM/100ML-% IV SOLN
INTRAVENOUS | Status: AC
  Filled 2024-03-23: qty 100

## 2024-03-23 MED ORDER — OXYCODONE HCL 5 MG/5ML PO SOLN: 5.0000 mg | Freq: Once | ORAL | Status: DC | PRN

## 2024-03-23 MED ORDER — DROPERIDOL 2.5 MG/ML IJ SOLN: 0.6250 mg | Freq: Once | INTRAMUSCULAR | Status: DC | PRN

## 2024-03-23 MED ORDER — ACETAMINOPHEN 500 MG PO TABS
ORAL_TABLET | ORAL | Status: AC
  Filled 2024-03-23: qty 2

## 2024-03-23 SURGICAL SUPPLY — 27 items
BLADE INF TURB ROT M4 2 5PK (BLADE) IMPLANT
BLADE SHAVER TURBINATE 11X2.9 (BLADE) IMPLANT
BLADE SURG 11 STRL SS (BLADE) IMPLANT
CANISTER SUCT 1200ML W/VALVE (MISCELLANEOUS) ×1 IMPLANT
COAGULATOR SUCT 8FR VV (MISCELLANEOUS) IMPLANT
COAGULATOR SUCT SWTCH 10FR 6 (ELECTROSURGICAL) IMPLANT
CORD BIPOLAR FORCEPS 12FT (ELECTRODE) IMPLANT
DRSG TELFA 3X8 NADH STRL (GAUZE/BANDAGES/DRESSINGS) IMPLANT
ELECTRODE REM PT RTRN 9FT ADLT (ELECTROSURGICAL) ×1 IMPLANT
FORCEPS BIPOLAR SPETZLER 8 1.0 (NEUROSURGERY SUPPLIES) IMPLANT
GAUZE SPONGE 2X2 STRL 8-PLY (GAUZE/BANDAGES/DRESSINGS) ×1 IMPLANT
GLOVE BIO SURGEON STRL SZ7 (GLOVE) ×1 IMPLANT
GLOVE BIOGEL PI IND STRL 7.5 (GLOVE) IMPLANT
GLOVE SURG SS PI 7.0 STRL IVOR (GLOVE) IMPLANT
GOWN STRL REUS W/ TWL LRG LVL3 (GOWN DISPOSABLE) ×2 IMPLANT
IV SET EXT 30 76VOL 4 MALE LL (IV SETS) IMPLANT
NDL PRECISIONGLIDE 27X1.5 (NEEDLE) ×1 IMPLANT
PACK BASIN DAY SURGERY FS (CUSTOM PROCEDURE TRAY) ×1 IMPLANT
PACK ENT DAY SURGERY (CUSTOM PROCEDURE TRAY) ×1 IMPLANT
PATTIES SURGICAL .5 X3 (DISPOSABLE) ×1 IMPLANT
SLEEVE SCD COMPRESS KNEE MED (STOCKING) ×1 IMPLANT
SOLN 0.9% NACL POUR BTL 1000ML (IV SOLUTION) ×1 IMPLANT
SUT CHROMIC 4 0 RB 1X27 (SUTURE) ×1 IMPLANT
SUT MNCRL AB 4-0 PS2 18 (SUTURE) IMPLANT
SUT PLAIN 4 0 ~~LOC~~ 1 (SUTURE) IMPLANT
TOWEL GREEN STERILE FF (TOWEL DISPOSABLE) ×1 IMPLANT
TUBE SALEM SUMP 16F (TUBING) ×1 IMPLANT

## 2024-03-23 NOTE — Anesthesia Procedure Notes (Addendum)
 Procedure Name: Intubation Date/Time: 03/23/2024 9:59 AM  Performed by: Paul Lamarr BRAVO, MDPre-anesthesia Checklist: Patient identified, Emergency Drugs available, Suction available and Patient being monitored Patient Re-evaluated:Patient Re-evaluated prior to induction Oxygen Delivery Method: Circle system utilized Preoxygenation: Pre-oxygenation with 100% oxygen Induction Type: IV induction Ventilation: Mask ventilation without difficulty Laryngoscope Size: Miller and 3 Grade View: Grade II Tube type: Oral Tube size: 7.5 mm Number of attempts: 3 Airway Equipment and Method: Stylet Placement Confirmation: ETT inserted through vocal cords under direct vision, positive ETCO2 and breath sounds checked- equal and bilateral Secured at: 24 cm Tube secured with: Tape Dental Injury: Teeth and Oropharynx as per pre-operative assessment  Comments: First attempt by CRNA with Cleotilde 2 blade with poor view, second attempt with Miller 3 blade, unable to view glottis. Third attempt by myself with Cleotilde 3 blade, grade 2A view, atraumatic intubation. Lawence, MD

## 2024-03-23 NOTE — H&P (Signed)
 Mason Morales is an 68 y.o. male.    Chief Complaint:  Nasal obstruction  HPI: Patient presents today for planned elective nasal surgery.  He denies any interval change in history since office visit.  Past Medical History:  Diagnosis Date   Allergic rhinitis, cause unspecified 10/26/2010   Allergy    DDD (degenerative disc disease), cervical 10/26/2010   DDD (degenerative disc disease), lumbar 10/26/2010   Depression    Generalized headaches    GERD (gastroesophageal reflux disease)    History of kidney stones    HIV disease (HCC) 06/06/2021   HTN (hypertension) 10/26/2010   Hypertension    Kidney stones    Panic attack    Vaccine counseling 06/06/2021    Past Surgical History:  Procedure Laterality Date   CYSTOSCOPY N/A 06/25/2023   Procedure: FLEXIBLE CYSTOSCOPY;  Surgeon: Elisabeth Valli BIRCH, MD;  Location: [facility];  Service: Urology;  Laterality: N/A;   HYDROCELE EXCISION Right 06/25/2023   Procedure: RIGHT HYDROCELECTOMY ADULT;  Surgeon: Elisabeth Valli BIRCH, MD;  Location: [facility];  Service: Urology;  Laterality: Right;  60 MINUTES   MOUTH SURGERY  at 68 yo after horse accident   SKIN GRAFT     and plastic surgery on mouth x 3    Family History  Problem Relation Age of Onset   Hyperlipidemia Other    Heart disease Other    Stroke Other    Hypertension Other    Diabetes Other    Cancer Other        prostate cancer   Diabetes Other    Alcohol abuse Other    Cancer Other        breast cancer   Stroke Other    Colon cancer Neg Hx    Esophageal cancer Neg Hx    Pancreatic cancer Neg Hx    Rectal cancer Neg Hx    Stomach cancer Neg Hx     Social History:  reports that he quit smoking about 10 years ago. His smoking use included cigarettes. He has never used smokeless tobacco. He reports current alcohol use of about 6.0 standard drinks of alcohol per week. He reports current drug use. Drug: Marijuana.  Allergies:  Allergies  Allergen Reactions   Gadolinium  Derivatives Hives   Sulfur Dioxide Nausea And Vomiting   Isovue  [Iopamidol ] Hives and Itching   Sulfa Antibiotics Nausea And Vomiting    Medications Prior to Admission  Medication Sig Dispense Refill   amLODipine  (NORVASC ) 10 MG tablet Take 1 tablet (10 mg total) by mouth daily. 90 tablet 3   CABENUVA  600 & 900 MG/3ML injection INJECT 1 KIT INTO MUSCLE EVERY 2 MONTHS 6 mL 5   citalopram  (CELEXA ) 20 MG tablet TAKE 1 TABLET(20 MG) BY MOUTH DAILY 90 tablet 3   Cyanocobalamin  (B-12 PO) Take 1 tablet by mouth daily.     Omega-3 Fatty Acids (FISH OIL) 300 MG CAPS Take by mouth.     VITAMIN D  PO Take 1 capsule by mouth daily.     ondansetron  (ZOFRAN ) 4 MG tablet Take 1 tablet (4 mg total) by mouth every 6 (six) hours. 12 tablet 0    No results found for this or any previous visit (from the past 48 hours). No results found.  ROS: negative other than stated in HPI  Blood pressure (!) 181/81, pulse 66, temperature (!) 97.5 F (36.4 C), temperature source Temporal, resp. rate 16, height 5' 9 (1.753 m), weight 70 kg, SpO2 100%.  PHYSICAL EXAM: General: Resting comfortably in NAD  Lungs: Non-labored respirations  Nose: Moderate septal deviation, turbinate hypertrophy   Assessment/Plan Nasal obstruction, deviated septum - Plan septoplasty and bilateral inferior turbinate reduction   Darlyn Fries MD  03/23/2024, 8:43 AM

## 2024-03-23 NOTE — Anesthesia Postprocedure Evaluation (Signed)
 Anesthesia Post Note  Patient: Mason Morales  Procedure(s) Performed: SEPTOPLASTY WITH NASAL TURBINATE REDUCTION (Bilateral: Nose)     Patient location during evaluation: PACU Anesthesia Type: General Level of consciousness: awake and alert Pain management: pain level controlled Vital Signs Assessment: post-procedure vital signs reviewed and stable Respiratory status: spontaneous breathing, nonlabored ventilation and respiratory function stable Cardiovascular status: blood pressure returned to baseline Postop Assessment: no apparent nausea or vomiting Anesthetic complications: no   No notable events documented.  Last Vitals:  Vitals:   03/23/24 1107 03/23/24 1117  BP:  (!) 141/90  Pulse: 70 64  Resp: 13 20  Temp:  (!) 36.4 C  SpO2: 98% 99%    Last Pain:  Vitals:   03/23/24 1117  TempSrc: Temporal  PainSc: 0-No pain                 Vertell Row

## 2024-03-23 NOTE — Anesthesia Preprocedure Evaluation (Addendum)
 Anesthesia Evaluation  Patient identified by MRN, date of birth, ID band Patient awake    Reviewed: Allergy & Precautions, NPO status , Patient's Chart, lab work & pertinent test results  History of Anesthesia Complications Negative for: history of anesthetic complications  Airway Mallampati: II  TM Distance: >3 FB Neck ROM: Full    Dental  (+) Missing,    Pulmonary former smoker   Pulmonary exam normal        Cardiovascular hypertension, Pt. on medications Normal cardiovascular exam     Neuro/Psych  Headaches  Anxiety Depression       GI/Hepatic ,GERD  Controlled,,  Endo/Other  negative endocrine ROS    Renal/GU      Musculoskeletal negative musculoskeletal ROS (+)    Abdominal   Peds  Hematology  (+) HIV  Anesthesia Other Findings Deviated nasal septum and hypertrophy of nasal turbinates  Reproductive/Obstetrics                              Anesthesia Physical Anesthesia Plan  ASA: 2  Anesthesia Plan: General   Post-op Pain Management: Tylenol  PO (pre-op)*   Induction: Intravenous  PONV Risk Score and Plan: 2 and Treatment may vary due to age or medical condition, Ondansetron , Dexamethasone , Midazolam , Propofol  infusion and TIVA  Airway Management Planned: Oral ETT  Additional Equipment: None  Intra-op Plan:   Post-operative Plan: Extubation in OR  Informed Consent: I have reviewed the patients History and Physical, chart, labs and discussed the procedure including the risks, benefits and alternatives for the proposed anesthesia with the patient or authorized representative who has indicated his/her understanding and acceptance.     Dental advisory given  Plan Discussed with: CRNA  Anesthesia Plan Comments:          Anesthesia Quick Evaluation

## 2024-03-23 NOTE — Op Note (Signed)
 OPERATIVE NOTE  Mason Morales Date/Time of Admission: 03/23/2024  8:18 AM  CSN: [id number];MRN:[id number] Attending Provider: Maggie Hussar, MD Room/Bed: [facility]SP/NONE DOB: [birth year] Age: 68 y.o.   Pre-Op Diagnosis: Deviated nasal septum and hypertrophy of nasal turbinates.  Post-Op Diagnosis: Deviated nasal septum and hypertrophy of nasal turbinates.  Procedure: Procedure(s): SEPTOPLASTY SUBMUCOSAL RESECTION OF BILATERAL INFERIOR TURBINATES  Anesthesia: General  Surgeon(s): Zach Aloha Bartok, MD  Staff: Circulator: Elaine Avelina PARAS, RN Scrub Person: Alto Charmaine PARAS  Implants: * No implants in log *  Specimens: * No specimens in log *  Complications: None   EBL: 25 ML  Condition: stable  Operative Findings:  Moderate left septal deviation and turbinate hypertrophy  Description of Operation: After informed consent was obtained the patient is brought back to the operating room and intubated by anesthesia.  The nose was prepped by injecting 0.25% Marcaine  with epinephrine in the septum and turbinates and decongested with Afrin-soaked cottonoids.  A right hemitransfixion incision was made and a mucoperichondrial flap was then raised.  A vertical incision was made just posterior to the nasal spine and a contralateral flap raised.  All deviated portions of septal cartilage and bone were removed with Rudean forcep and the hemitransfixion incision closed with 4-0 chromic in interrupted fashion.  A septal mattressing suture was then placed with 4-0 chromic.  Then the turbinate reduction was then performed with a stab incision being made at the head of each inferior turbinate and a submucosal plane then dissected out.  Using a microdebrider submucosal tissue was resected along the inferior turbinates bilaterally and the remainder of the bone fracture laterally.  The patient was then transferred over to anesthesia in stable condition.  [name] ENT

## 2024-03-23 NOTE — Transfer of Care (Signed)
 Immediate Anesthesia Transfer of Care Note  Patient: Mason Morales  Procedure(s) Performed: SEPTOPLASTY WITH NASAL TURBINATE REDUCTION (Bilateral: Nose)  Patient Location: PACU  Anesthesia Type:General  Level of Consciousness: sedated  Airway & Oxygen Therapy: Patient connected to face mask oxygen  Post-op Assessment: Report given to RN and Post -op Vital signs reviewed and stable  Post vital signs: Reviewed and stable  Last Vitals:  Vitals Value Taken Time  BP 122/78 03/23/24 10:40  Temp    Pulse 72 03/23/24 10:43  Resp 14 03/23/24 10:43  SpO2 100 % 03/23/24 10:43  Vitals shown include unfiled device data.  Last Pain:  Vitals:   03/23/24 0841  TempSrc: Temporal  PainSc: 0-No pain         Complications: No notable events documented.

## 2024-03-23 NOTE — Discharge Instructions (Signed)
 No Tylenol  before 2:45pm.   Post Anesthesia Home Care Instructions  Activity: Get plenty of rest for the remainder of the day. A responsible individual must stay with you for 24 hours following the procedure.  For the next 24 hours, DO NOT: -Drive a car -Advertising copywriter -Drink alcoholic beverages -Take any medication unless instructed by your physician -Make any legal decisions or sign important papers.  Meals: Start with liquid foods such as gelatin or soup. Progress to regular foods as tolerated. Avoid greasy, spicy, heavy foods. If nausea and/or vomiting occur, drink only clear liquids until the nausea and/or vomiting subsides. Call your physician if vomiting continues.  Special Instructions/Symptoms: Your throat may feel dry or sore from the anesthesia or the breathing tube placed in your throat during surgery. If this causes discomfort, gargle with warm salt water . The discomfort should disappear within 24 hours.

## 2024-03-24 ENCOUNTER — Encounter (HOSPITAL_BASED_OUTPATIENT_CLINIC_OR_DEPARTMENT_OTHER): Payer: Self-pay | Admitting: Otolaryngology

## 2024-03-25 ENCOUNTER — Telehealth: Payer: Self-pay

## 2024-03-25 NOTE — Telephone Encounter (Signed)
 [facility] Patient Advocate Encounter  Patient's medications Cabenuva  have been couriered to [facility] from [employer] and will be administered at the patients appointment on 04/01/24.  Arland Hutchinson, CPhT Specialty Pharmacy Patient [facility] Phone: [phone] Fax:  [phone]

## 2024-03-30 NOTE — Progress Notes (Unsigned)
 HPI: Mason Morales is a 68 y.o. male who presents to the [facility] pharmacy clinic for Cabenuva  administration.  Referring ID Physician: Dr. 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   Chronic constipation 07/22/2019   Left lumbar radiculopathy 03/26/2017   Pelvic pain 08/24/2016   Left cervical radiculopathy 05/20/2015   Anxiety and depression 05/20/2015   GERD (gastroesophageal reflux disease) 10/26/2010   Allergic rhinitis 10/26/2010   HTN (hypertension) 10/26/2010   Kidney stones 10/26/2010   DDD (degenerative disc disease), cervical 10/26/2010   DDD (degenerative disc disease), lumbar 10/26/2010   Left shoulder pain 10/26/2010   Preventative health care 10/26/2010    Patient's Medications  New Prescriptions   No medications on file  Previous Medications   AMLODIPINE  (NORVASC ) 10 MG TABLET    Take 1 tablet (10 mg total) by mouth daily.   CABENUVA  600 & 900 MG/3ML INJECTION    INJECT 1 KIT INTO MUSCLE EVERY 2 MONTHS   CITALOPRAM  (CELEXA ) 20 MG TABLET    TAKE 1 TABLET(20 MG) BY MOUTH DAILY   CYANOCOBALAMIN  (B-12 PO)    Take 1 tablet by mouth daily.   HYDROCODONE -ACETAMINOPHEN  (NORCO/VICODIN) 5-325 MG TABLET    Take 1 tablet by mouth every 4 (four) hours as needed for moderate pain (pain score 4-6).   OMEGA-3 FATTY ACIDS (FISH OIL) 300 MG CAPS    Take by mouth.   ONDANSETRON  (ZOFRAN ) 4 MG TABLET    Take 1 tablet (4 mg total) by mouth every 6 (six) hours.   VITAMIN D  PO    Take 1 capsule by mouth daily.  Modified Medications   No medications on file  Discontinued  Medications   No medications on file    Allergies: Allergies  Allergen Reactions   Gadolinium Derivatives Hives   Sulfur Dioxide Nausea And Vomiting   Isovue  [Iopamidol ] Hives and Itching   Sulfa Antibiotics Nausea And Vomiting    Past Medical History: Past Medical History:  Diagnosis Date   Allergic rhinitis, cause unspecified 10/26/2010   Allergy    DDD (degenerative disc disease), cervical 10/26/2010   DDD (degenerative disc disease), lumbar 10/26/2010   Depression    Generalized headaches    GERD (gastroesophageal reflux disease)    History of kidney stones    HIV disease (HCC) 06/06/2021   HTN (hypertension) 10/26/2010   Hypertension    Kidney stones    Panic attack    Vaccine counseling 06/06/2021    Social History: Social History   Socioeconomic History   Marital status: Single    Spouse name: Not on file   Number of children: Not on file   Years of education: 14   Highest education level: Not on file  Occupational History   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[employer]  Tobacco Use   Smoking status: Former    Current packs/day: 0.00    Types: Cigarettes    Quit date: 03/20/2014    Years since quitting: 10.0   Smokeless tobacco: Never  Vaping Use   Vaping status: Never Used  Substance and Sexual  Activity   Alcohol use: Yes    Alcohol/week: 6.0 standard drinks of alcohol    Types: 6 Cans of beer per week    Comment: occasionally   Drug use: Yes    Types: Marijuana    Comment: THC-daily   Sexual activity: Yes    Comment: declined condoms  Other Topics Concern   Not on file  Social History Narrative   Lives alone/2025   Social Drivers of Health   Financial Resource Strain: Medium Risk (08/20/2023)   Overall Financial Resource Strain (CARDIA)    Difficulty of Paying Living Expenses: Somewhat hard  Food Insecurity: Low Risk (02/03/2024)   Received from [facility]   Hunger Vital Sign    Within the past 12 months, you worried  that your food would run out before you got money to buy more: Never true    Within the past 12 months, the food you bought just didn't last and you didn't have money to get more: Not on file  Transportation Needs: No Transportation Needs (02/03/2024)   Received from [facility]    In the past 12 months, has lack of reliable transportation kept you from medical appointments, meetings, work or from getting things needed for daily living? : No  Physical Activity: Sufficiently Active (08/20/2023)   Exercise Vital Sign    Days of Exercise per Week: 7 days    Minutes of Exercise per Session: 60 min  Stress: Stress Concern Present (08/20/2023)   [employer] of Occupational Health - Occupational Stress Questionnaire    Feeling of Stress : Very much  Social Connections: Moderately Integrated (08/20/2023)   Social Connection and Isolation Panel    Frequency of Communication with Friends and Family: More than three times a week    Frequency of Social Gatherings with Friends and Family: More than three times a week    Attends Religious Services: More than 4 times per year    Active Member of Clubs or Organizations: Yes    Attends Banker Meetings: 1 to 4 times per year    Marital Status: Never married    Labs: Lab Results  Component Value Date   HIV1RNAQUANT NOT DETECTED 12/04/2023   HIV1RNAQUANT Not Detected 06/11/2023   HIV1RNAQUANT Not Detected 12/05/2022   CD4TABS 1,149 12/04/2023   CD4TABS 1,124 12/05/2022    RPR and STI Lab Results  Component Value Date   LABRPR REACTIVE (A) 12/05/2022   LABRPR REACTIVE (A) 06/06/2022   LABRPR REACTIVE (A) 01/09/2022   LABRPR REACTIVE (A) 09/28/2021   LABRPR REACTIVE (A) 06/07/2021   RPRTITER 1:1 (H) 12/05/2022   RPRTITER 1:1 (H) 06/06/2022   RPRTITER 1:1 (H) 01/09/2022   RPRTITER 1:2 (H) 09/28/2021   RPRTITER 1:2 (H) 06/07/2021    STI Results GC GC CT CT  12/05/2022  9:58 AM Negative    Negative    Negative    Negative    06/06/2022  4:10 PM Negative    Negative   Negative    Negative    01/09/2022 10:17 AM Negative    Negative   Negative    Negative    09/28/2021 11:18 AM Negative   Negative    05/09/2021 12:25 PM Negative   Negative    02/13/2021  3:57 PM Negative    Negative   Negative    Negative    12/28/2020  2:31 PM Negative    Negative    Negative   Negative  Negative    Negative    11/08/2016  8:15 AM  NOT DETECTED   NOT DETECTED     Hepatitis B Lab Results  Component Value Date   HEPBSAB REACTIVE (A) 05/09/2021   HEPBSAG NON-REACTIVE 06/06/2022   HEPBCAB REACTIVE (A) 05/09/2021   Hepatitis C Lab Results  Component Value Date   HEPCAB NON-REACTIVE 06/06/2022   Hepatitis A Lab Results  Component Value Date   HAV NON-REACTIVE 05/09/2021   Lipids: Lab Results  Component Value Date   CHOL 141 04/08/2023   TRIG 113.0 04/08/2023   HDL 56.60 04/08/2023   CHOLHDL 2 04/08/2023   VLDL 22.6 04/08/2023   LDLCALC 62 04/08/2023    TARGET DATE:  The 15th of the month  Assessment: Augustus presents today for their maintenance Cabenuva  injections. Initial/past injections were tolerated well without issues. No problems with systemic effects of injections.   Administered cabotegravir  600mg /48mL in left upper outer quadrant of the gluteal muscle. Administered rilpivirine  900 mg/3mL in the right upper outer quadrant of the gluteal muscle. Monitored patient for 10 minutes after injection. Injections were tolerated well without issue. Patient will follow up in 2 months for next injection. Due for lipid panel at next blood draw in 4 months.  Eligible ofr Shingles, flu, COVID, and 2/2 HAV vaccines; ***.   Plan: - Administer Cabenuva  injections  - Next injections scheduled for *** - Call with any issues or questions  Alan Geralds, PharmD, CPP, BCIDP, AAHIVP Clinical Pharmacist Practitioner Infectious Diseases Clinical Pharmacist [facility]

## 2024-04-01 ENCOUNTER — Ambulatory Visit: Admitting: Pharmacist

## 2024-04-01 ENCOUNTER — Other Ambulatory Visit: Payer: Self-pay

## 2024-04-01 DIAGNOSIS — Z23 Encounter for immunization: Secondary | ICD-10-CM

## 2024-04-01 DIAGNOSIS — B20 Human immunodeficiency virus [HIV] disease: Secondary | ICD-10-CM | POA: Diagnosis not present

## 2024-04-01 MED ORDER — CABOTEGRAVIR & RILPIVIRINE ER 600 & 900 MG/3ML IM SUER
1.0000 | Freq: Once | INTRAMUSCULAR | Status: AC
  Administered 2024-04-01: 1 via INTRAMUSCULAR

## 2024-04-06 DIAGNOSIS — J342 Deviated nasal septum: Secondary | ICD-10-CM | POA: Diagnosis not present

## 2024-05-22 ENCOUNTER — Other Ambulatory Visit (HOSPITAL_COMMUNITY): Payer: Self-pay

## 2024-05-22 ENCOUNTER — Telehealth: Payer: Self-pay

## 2024-05-22 NOTE — Telephone Encounter (Signed)
 Submitted a Prior Authorization request to [employer] for Cabenuva  via [employer]. Will update once we receive a response.  Pharmacy Benefits  PA ID: [id number]

## 2024-05-22 NOTE — Telephone Encounter (Signed)
 Received notification from [employer] regarding a prior authorization for Cabenuva . Authorization has been APPROVED from 05/22/24 to 04/22/25.   Per test claim, copay for 56 days supply is $2013.75  Patient can continue to fill through [facility]  Authorization # [id number]  Patient have SPAP to make his copay $0.00

## 2024-05-26 ENCOUNTER — Other Ambulatory Visit (HOSPITAL_COMMUNITY): Payer: Self-pay

## 2024-05-27 ENCOUNTER — Telehealth: Payer: Self-pay

## 2024-05-27 NOTE — Telephone Encounter (Signed)
 [facility] Patient Advocate Encounter  Patient's medications CABENUVA  have been couriered to [facility] from [employer] and will be administered at the patients appointment on 06/02/24.  Charmaine Sharps, CPhT Specialty Pharmacy Patient [facility] Phone: [phone] Fax:  [phone]

## 2024-05-27 NOTE — Progress Notes (Unsigned)
 "  HPI: Jayshon Dommer is a 69 y.o. male who presents to the [facility] pharmacy clinic for Cabenuva  administration.  Referring ID Physician: Dr. 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   Chronic constipation 07/22/2019   Left lumbar radiculopathy 03/26/2017   Pelvic pain 08/24/2016   Left cervical radiculopathy 05/20/2015   Anxiety and depression 05/20/2015   GERD (gastroesophageal reflux disease) 10/26/2010   Allergic rhinitis 10/26/2010   HTN (hypertension) 10/26/2010   Kidney stones 10/26/2010   DDD (degenerative disc disease), cervical 10/26/2010   DDD (degenerative disc disease), lumbar 10/26/2010   Left shoulder pain 10/26/2010   Preventative health care 10/26/2010    Patient's Medications  New Prescriptions   No medications on file  Previous Medications   AMLODIPINE  (NORVASC ) 10 MG TABLET    Take 1 tablet (10 mg total) by mouth daily.   CABENUVA  600 & 900 MG/3ML INJECTION    INJECT 1 KIT INTO MUSCLE EVERY 2 MONTHS   CITALOPRAM  (CELEXA ) 20 MG TABLET    TAKE 1 TABLET(20 MG) BY MOUTH DAILY   CYANOCOBALAMIN  (B-12 PO)    Take 1 tablet by mouth daily.   HYDROCODONE -ACETAMINOPHEN  (NORCO/VICODIN) 5-325 MG TABLET    Take 1 tablet by mouth every 4 (four) hours as needed for moderate pain (pain score 4-6).   OMEGA-3 FATTY ACIDS (FISH OIL) 300 MG CAPS    Take by mouth.   ONDANSETRON  (ZOFRAN ) 4 MG TABLET    Take 1 tablet (4 mg total) by mouth every 6 (six) hours.   VITAMIN D  PO    Take 1 capsule by mouth daily.  Modified Medications   No medications on file  Discontinued  Medications   No medications on file    Allergies: Allergies[1]  Past Medical History: Past Medical History:  Diagnosis Date   Allergic rhinitis, cause unspecified 10/26/2010   Allergy    DDD (degenerative disc disease), cervical 10/26/2010   DDD (degenerative disc disease), lumbar 10/26/2010   Depression    Generalized headaches    GERD (gastroesophageal reflux disease)    History of kidney stones    HIV disease (HCC) 06/06/2021   HTN (hypertension) 10/26/2010   Hypertension    Kidney stones    Panic attack    Vaccine counseling 06/06/2021    Social History: Social History   Socioeconomic History   Marital status: Single    Spouse name: Not on file   Number of children: Not on file   Years of education: 14   Highest education level: Not on file  Occupational History   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[employer]  Tobacco Use   Smoking status: Former    Current packs/day: 0.00    Types: Cigarettes    Quit date: 03/20/2014    Years since quitting: 10.1   Smokeless tobacco: Never  Vaping Use   Vaping status: Never Used  Substance and Sexual Activity   Alcohol use: Yes    Alcohol/week: 6.0 standard drinks of alcohol    Types: 6 Cans of beer per week    Comment:  occasionally   Drug use: Yes    Types: Marijuana    Comment: THC-daily   Sexual activity: Yes    Comment: declined condoms  Other Topics Concern   Not on file  Social History Narrative   Lives alone/2025   Social Drivers of Health   Tobacco Use: Low Risk (04/06/2024)   Received from [facility]   Patient History    Smoking Tobacco Use: Never    Smokeless Tobacco Use: Never    Passive Exposure: Never  Recent Concern: Tobacco Use - Medium Risk (03/23/2024)   Patient History    Smoking Tobacco Use: Former    Smokeless Tobacco Use: Never    Passive Exposure: Not on file  Financial Resource Strain: Medium Risk (08/20/2023)   Overall Financial Resource Strain (CARDIA)     Difficulty of Paying Living Expenses: Somewhat hard  Food Insecurity: Low Risk (02/03/2024)   Received from [facility]   Epic    Within the past 12 months, you worried that your food would run out before you got money to buy more: Never true    Within the past 12 months, the food you bought just didn't last and you didn't have money to get more: Not on file  Transportation Needs: No Transportation Needs (02/03/2024)   Received from [facility]    In the past 12 months, has lack of reliable transportation kept you from medical appointments, meetings, work or from getting things needed for daily living? : No  Physical Activity: Sufficiently Active (08/20/2023)   Exercise Vital Sign    Days of Exercise per Week: 7 days    Minutes of Exercise per Session: 60 min  Stress: Stress Concern Present (08/20/2023)   [employer] of Occupational Health - Occupational Stress Questionnaire    Feeling of Stress : Very much  Social Connections: Moderately Integrated (08/20/2023)   Social Connection and Isolation Panel    Frequency of Communication with Friends and Family: More than three times a week    Frequency of Social Gatherings with Friends and Family: More than three times a week    Attends Religious Services: More than 4 times per year    Active Member of Clubs or Organizations: Yes    Attends Banker Meetings: 1 to 4 times per year    Marital Status: Never married  Depression (PHQ2-9): Low Risk (08/20/2023)   Depression (PHQ2-9)    PHQ-2 Score: 0  Recent Concern: Depression (PHQ2-9) - Medium Risk (08/06/2023)   Depression (PHQ2-9)    PHQ-2 Score: 6  Alcohol Screen: Low Risk (08/20/2023)   Alcohol Screen    Last Alcohol Screening Score (AUDIT): 0  Housing: Low Risk (02/03/2024)   Received from [facility]   Epic    What is your living situation today?: I have a steady place to live    Think about the place you live. Do you have problems with any of the  following? Choose all that apply:: Not on file  Utilities: Low Risk (02/03/2024)   Received from [facility]   Utilities    In the past 12 months has the electric, gas, oil, or water  company threatened to shut off services in your home? : No  Health Literacy: Adequate Health Literacy (08/20/2023)   B1300 Health Literacy    Frequency of need for help with medical instructions: Never    Labs: Lab Results  Component Value Date   HIV1RNAQUANT NOT DETECTED 12/04/2023   HIV1RNAQUANT Not Detected  06/11/2023   HIV1RNAQUANT Not Detected 12/05/2022   CD4TABS 1,149 12/04/2023   CD4TABS 1,124 12/05/2022    RPR and STI Lab Results  Component Value Date   LABRPR REACTIVE (A) 12/05/2022   LABRPR REACTIVE (A) 06/06/2022   LABRPR REACTIVE (A) 01/09/2022   LABRPR REACTIVE (A) 09/28/2021   LABRPR REACTIVE (A) 06/07/2021   RPRTITER 1:1 (H) 12/05/2022   RPRTITER 1:1 (H) 06/06/2022   RPRTITER 1:1 (H) 01/09/2022   RPRTITER 1:2 (H) 09/28/2021   RPRTITER 1:2 (H) 06/07/2021    STI Results GC GC CT CT  12/05/2022  9:58 AM Negative    Negative   Negative    Negative    06/06/2022  4:10 PM Negative    Negative   Negative    Negative    01/09/2022 10:17 AM Negative    Negative   Negative    Negative    09/28/2021 11:18 AM Negative   Negative    05/09/2021 12:25 PM Negative   Negative    02/13/2021  3:57 PM Negative    Negative   Negative    Negative    12/28/2020  2:31 PM Negative    Negative    Negative   Negative    Negative    Negative    11/08/2016  8:15 AM  NOT DETECTED   NOT DETECTED     Hepatitis B Lab Results  Component Value Date   HEPBSAB REACTIVE (A) 05/09/2021   HEPBSAG NON-REACTIVE 06/06/2022   HEPBCAB REACTIVE (A) 05/09/2021   Hepatitis C Lab Results  Component Value Date   HEPCAB NON-REACTIVE 06/06/2022   Hepatitis A Lab Results  Component Value Date   HAV NON-REACTIVE 05/09/2021   Lipids: Lab Results  Component Value Date   CHOL 141 04/08/2023   TRIG  113.0 04/08/2023   HDL 56.60 04/08/2023   CHOLHDL 2 04/08/2023   VLDL 22.6 04/08/2023   LDLCALC 62 04/08/2023    TARGET DATE:  The 15th of the month  Assessment: Audie presents today for their maintenance Cabenuva  injections. Initial/past injections were tolerated well without issues. No problems with systemic effects of injections.   Administered cabotegravir  600mg /47mL in left upper outer quadrant of the gluteal muscle. Administered rilpivirine  900 mg/3mL in the right upper outer quadrant of the gluteal muscle. Monitored patient for 10 minutes after injection. Injections were tolerated well without issue. Patient will follow up in 2 months for next injection. Will check HIV RNA today along with annual lipid panel.  Eligible for 2/2 HAV and Shingles vaccines; ***.   Plan: - Administer Cabenuva  injections  - Check HIV RNA and lipid panel  - Next injections scheduled for *** - Call with any issues or questions  Alan Geralds, PharmD, CPP, BCIDP, AAHIVP Clinical Pharmacist Practitioner Infectious Diseases Clinical Pharmacist [facility]      [1]  Allergies Allergen Reactions   Gadolinium Derivatives Hives   Sulfur Dioxide Nausea And Vomiting   Isovue  [Iopamidol ] Hives and Itching   Sulfa Antibiotics Nausea And Vomiting   "

## 2024-06-02 ENCOUNTER — Ambulatory Visit: Admitting: Pharmacist

## 2024-06-02 DIAGNOSIS — B20 Human immunodeficiency virus [HIV] disease: Secondary | ICD-10-CM

## 2024-08-21 ENCOUNTER — Ambulatory Visit
